# Patient Record
Sex: Female | Born: 1945 | Race: Black or African American | Hispanic: No | Smoking: Former smoker
Health system: Southern US, Community
[De-identification: ages and names within clinical notes are randomized; demographics above are authoritative.]

## PROBLEM LIST (undated history)

## (undated) DIAGNOSIS — E785 Hyperlipidemia, unspecified: Secondary | ICD-10-CM

## (undated) DIAGNOSIS — R011 Cardiac murmur, unspecified: Secondary | ICD-10-CM

## (undated) DIAGNOSIS — F419 Anxiety disorder, unspecified: Secondary | ICD-10-CM

## (undated) DIAGNOSIS — N201 Calculus of ureter: Secondary | ICD-10-CM

## (undated) DIAGNOSIS — Z973 Presence of spectacles and contact lenses: Secondary | ICD-10-CM

## (undated) DIAGNOSIS — I10 Essential (primary) hypertension: Secondary | ICD-10-CM

## (undated) DIAGNOSIS — Z87442 Personal history of urinary calculi: Secondary | ICD-10-CM

## (undated) DIAGNOSIS — M1990 Unspecified osteoarthritis, unspecified site: Secondary | ICD-10-CM

## (undated) DIAGNOSIS — Z972 Presence of dental prosthetic device (complete) (partial): Secondary | ICD-10-CM

## (undated) DIAGNOSIS — I350 Nonrheumatic aortic (valve) stenosis: Secondary | ICD-10-CM

## (undated) HISTORY — PX: TOTAL KNEE ARTHROPLASTY: SHX125

## (undated) HISTORY — PX: TRANSTHORACIC ECHOCARDIOGRAM: SHX275

## (undated) HISTORY — DX: Nonrheumatic aortic (valve) stenosis: I35.0

## (undated) HISTORY — PX: REVISION TOTAL KNEE ARTHROPLASTY: SUR1280

## (undated) HISTORY — PX: ABDOMINAL HYSTERECTOMY: SHX81

---

## 1998-03-30 ENCOUNTER — Emergency Department (HOSPITAL_COMMUNITY): Admission: EM | Admit: 1998-03-30 | Discharge: 1998-03-30 | Payer: Self-pay | Admitting: Emergency Medicine

## 1998-04-06 ENCOUNTER — Encounter: Admission: RE | Admit: 1998-04-06 | Discharge: 1998-04-06 | Payer: Self-pay | Admitting: *Deleted

## 2000-04-17 ENCOUNTER — Other Ambulatory Visit: Admission: RE | Admit: 2000-04-17 | Discharge: 2000-04-17 | Payer: Self-pay | Admitting: Ophthalmology

## 2000-04-17 ENCOUNTER — Encounter (INDEPENDENT_AMBULATORY_CARE_PROVIDER_SITE_OTHER): Payer: Self-pay | Admitting: Specialist

## 2000-06-17 HISTORY — PX: CORNEAL TRANSPLANT: SHX108

## 2000-12-11 ENCOUNTER — Encounter: Admission: RE | Admit: 2000-12-11 | Discharge: 2000-12-11 | Payer: Self-pay | Admitting: Orthopedic Surgery

## 2000-12-11 ENCOUNTER — Encounter: Payer: Self-pay | Admitting: Orthopedic Surgery

## 2001-02-24 ENCOUNTER — Other Ambulatory Visit: Admission: RE | Admit: 2001-02-24 | Discharge: 2001-02-24 | Payer: Self-pay | Admitting: *Deleted

## 2001-11-26 ENCOUNTER — Emergency Department (HOSPITAL_COMMUNITY): Admission: EM | Admit: 2001-11-26 | Discharge: 2001-11-26 | Payer: Self-pay | Admitting: Emergency Medicine

## 2001-11-30 ENCOUNTER — Encounter: Admission: RE | Admit: 2001-11-30 | Discharge: 2001-11-30 | Payer: Self-pay | Admitting: Occupational Medicine

## 2001-11-30 ENCOUNTER — Encounter: Payer: Self-pay | Admitting: Occupational Medicine

## 2003-03-28 ENCOUNTER — Encounter: Payer: Self-pay | Admitting: Orthopedic Surgery

## 2003-03-30 ENCOUNTER — Inpatient Hospital Stay (HOSPITAL_COMMUNITY): Admission: RE | Admit: 2003-03-30 | Discharge: 2003-04-02 | Payer: Self-pay | Admitting: Orthopedic Surgery

## 2003-03-30 ENCOUNTER — Encounter: Payer: Self-pay | Admitting: Orthopedic Surgery

## 2003-04-19 ENCOUNTER — Ambulatory Visit (HOSPITAL_BASED_OUTPATIENT_CLINIC_OR_DEPARTMENT_OTHER): Admission: RE | Admit: 2003-04-19 | Discharge: 2003-04-19 | Payer: Self-pay | Admitting: Orthopedic Surgery

## 2003-04-20 ENCOUNTER — Encounter (INDEPENDENT_AMBULATORY_CARE_PROVIDER_SITE_OTHER): Payer: Self-pay | Admitting: *Deleted

## 2003-04-20 ENCOUNTER — Ambulatory Visit (HOSPITAL_COMMUNITY): Admission: RE | Admit: 2003-04-20 | Discharge: 2003-04-20 | Payer: Self-pay | Admitting: Orthopedic Surgery

## 2003-04-20 ENCOUNTER — Ambulatory Visit (HOSPITAL_BASED_OUTPATIENT_CLINIC_OR_DEPARTMENT_OTHER): Admission: RE | Admit: 2003-04-20 | Discharge: 2003-04-20 | Payer: Self-pay | Admitting: Orthopedic Surgery

## 2003-07-19 ENCOUNTER — Encounter: Admission: RE | Admit: 2003-07-19 | Discharge: 2003-07-19 | Payer: Self-pay | Admitting: Orthopedic Surgery

## 2003-08-01 ENCOUNTER — Inpatient Hospital Stay (HOSPITAL_COMMUNITY): Admission: RE | Admit: 2003-08-01 | Discharge: 2003-08-04 | Payer: Self-pay | Admitting: Orthopedic Surgery

## 2003-08-01 ENCOUNTER — Encounter (INDEPENDENT_AMBULATORY_CARE_PROVIDER_SITE_OTHER): Payer: Self-pay | Admitting: Specialist

## 2003-09-21 ENCOUNTER — Encounter (INDEPENDENT_AMBULATORY_CARE_PROVIDER_SITE_OTHER): Payer: Self-pay | Admitting: Specialist

## 2003-09-21 ENCOUNTER — Ambulatory Visit (HOSPITAL_COMMUNITY): Admission: RE | Admit: 2003-09-21 | Discharge: 2003-09-21 | Payer: Self-pay | Admitting: *Deleted

## 2003-09-21 HISTORY — PX: COLONOSCOPY WITH ESOPHAGOGASTRODUODENOSCOPY (EGD): SHX5779

## 2004-04-26 ENCOUNTER — Encounter: Admission: RE | Admit: 2004-04-26 | Discharge: 2004-04-26 | Payer: Self-pay | Admitting: Orthopedic Surgery

## 2004-06-19 ENCOUNTER — Ambulatory Visit (HOSPITAL_COMMUNITY): Admission: RE | Admit: 2004-06-19 | Discharge: 2004-06-19 | Payer: Self-pay | Admitting: Orthopedic Surgery

## 2004-08-06 ENCOUNTER — Inpatient Hospital Stay (HOSPITAL_COMMUNITY): Admission: RE | Admit: 2004-08-06 | Discharge: 2004-08-09 | Payer: Self-pay | Admitting: Orthopedic Surgery

## 2004-08-06 ENCOUNTER — Ambulatory Visit: Payer: Self-pay | Admitting: Physical Medicine & Rehabilitation

## 2004-08-06 ENCOUNTER — Encounter (INDEPENDENT_AMBULATORY_CARE_PROVIDER_SITE_OTHER): Payer: Self-pay | Admitting: *Deleted

## 2004-11-05 ENCOUNTER — Inpatient Hospital Stay (HOSPITAL_COMMUNITY): Admission: RE | Admit: 2004-11-05 | Discharge: 2004-11-08 | Payer: Self-pay | Admitting: Orthopedic Surgery

## 2007-02-16 ENCOUNTER — Emergency Department (HOSPITAL_COMMUNITY): Admission: EM | Admit: 2007-02-16 | Discharge: 2007-02-17 | Payer: Self-pay | Admitting: *Deleted

## 2007-09-01 ENCOUNTER — Encounter: Admission: RE | Admit: 2007-09-01 | Discharge: 2007-09-01 | Payer: Self-pay | Admitting: Internal Medicine

## 2008-12-12 ENCOUNTER — Encounter: Admission: RE | Admit: 2008-12-12 | Discharge: 2008-12-12 | Payer: Self-pay | Admitting: Internal Medicine

## 2009-04-10 ENCOUNTER — Ambulatory Visit: Payer: Self-pay | Admitting: Internal Medicine

## 2009-05-09 ENCOUNTER — Ambulatory Visit: Payer: Self-pay | Admitting: Internal Medicine

## 2009-05-16 ENCOUNTER — Encounter: Payer: Self-pay | Admitting: Internal Medicine

## 2009-07-20 ENCOUNTER — Observation Stay (HOSPITAL_COMMUNITY): Admission: EM | Admit: 2009-07-20 | Discharge: 2009-07-22 | Payer: Self-pay | Admitting: Emergency Medicine

## 2009-07-20 ENCOUNTER — Ambulatory Visit: Payer: Self-pay | Admitting: Cardiovascular Disease

## 2009-07-21 ENCOUNTER — Encounter: Payer: Self-pay | Admitting: Internal Medicine

## 2009-07-22 ENCOUNTER — Encounter: Payer: Self-pay | Admitting: Internal Medicine

## 2009-10-13 ENCOUNTER — Ambulatory Visit: Payer: Self-pay | Admitting: Internal Medicine

## 2009-10-13 DIAGNOSIS — I10 Essential (primary) hypertension: Secondary | ICD-10-CM | POA: Insufficient documentation

## 2009-10-13 DIAGNOSIS — R000 Tachycardia, unspecified: Secondary | ICD-10-CM

## 2009-10-13 DIAGNOSIS — E785 Hyperlipidemia, unspecified: Secondary | ICD-10-CM | POA: Insufficient documentation

## 2009-10-13 DIAGNOSIS — R0602 Shortness of breath: Secondary | ICD-10-CM | POA: Insufficient documentation

## 2009-11-15 ENCOUNTER — Encounter: Admission: RE | Admit: 2009-11-15 | Discharge: 2009-11-15 | Payer: Self-pay | Admitting: Internal Medicine

## 2009-11-29 ENCOUNTER — Encounter: Admission: RE | Admit: 2009-11-29 | Discharge: 2009-11-29 | Payer: Self-pay | Admitting: Internal Medicine

## 2010-03-09 ENCOUNTER — Ambulatory Visit: Payer: Self-pay | Admitting: Internal Medicine

## 2010-03-09 DIAGNOSIS — R079 Chest pain, unspecified: Secondary | ICD-10-CM | POA: Insufficient documentation

## 2010-05-29 ENCOUNTER — Ambulatory Visit (HOSPITAL_COMMUNITY)
Admission: RE | Admit: 2010-05-29 | Discharge: 2010-05-29 | Payer: Self-pay | Source: Home / Self Care | Attending: Orthopedic Surgery | Admitting: Orthopedic Surgery

## 2010-07-08 ENCOUNTER — Encounter: Payer: Self-pay | Admitting: Internal Medicine

## 2010-07-19 NOTE — Assessment & Plan Note (Signed)
Summary: EPH   Primary Provider:  Dr. Ricki Miller  CC:  sob and pt mother just passed so pt has been under alot of stress.  History of Present Illness: Patient is a 65 year old with a history of chest pain.  SHe was admitted to [facility] in February with chest pains.  Myoview was done which showed normal perfusion.  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  (peak grad 34 mm).  Plan was for continued medical rx with f/u Since seen, she had had some SOB.  Notes some dizziness but no presyncope/syncope.     Current Medications (verified): 1)  Azor 10-40 Mg Tabs (Amlodipine-Olmesartan) .Marland Kitchen.. 1 Tab By Mouth Once Daily 2)  Prednisolone Sodium Phosphate 1 % Soln (Prednisolone Sodium Phosphate) .... As Directed 3)  Lipitor 40 Mg Tabs (Atorvastatin Calcium) .... Take One Tablet By Mouth Daily. 4)  Vitamin D (Ergocalciferol) 50000 Unit Caps (Ergocalciferol) .... 2 Times Weekly 5)  Metoprolol Succinate 50 Mg Xr24h-Tab (Metoprolol Succinate) .... Take One Tablet By Mouth Daily 6)  Actonel 35 Mg Tabs (Risedronate Sodium) .Marland Kitchen.. 1 Tab By Mouth Once Daily 7)  Aspirin 81 Mg Tbec (Aspirin) .... Take One Tablet By Mouth Daily  Allergies: No Known Drug Allergies  Past History:  Past medical, surgical, family and social histories (including risk factors) reviewed, and no changes noted (except as noted below).  Past Medical History: Reviewed history from 10/11/2009 and no changes required.  Obesity.    Hypertension.    Hyperlipidemia.   Degenerative joint disease.   Past Surgical History: Reviewed history from 10/11/2009 and no changes required.   Status post bilateral knee replacements    Hysterectomy    Corneal transplant      Family History: Reviewed history from 10/11/2009 and no changes required. Father:  died of a   pulmonary embolus per report at age 5. Siblings: brother who is alive   with hypertension.    Social History: Reviewed history from 10/11/2009 and no changes required. patient lives in [place] Tobacco Use - Yes. 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   Vital Signs:  Patient profile:   65 year old female Height:      65 inches Weight:      233 pounds BMI:     38.91 Pulse rate:   81 / minute Resp:     14 per minute BP sitting:   122 / 90  (left arm)  Vitals Entered By: Kem Parkinson (October 13, 2009 2:24 PM)  Physical Exam  Additional Exam:  Patient is in NAD. HEENT:  Normocephalic, atraumatic. EOMI, PERRLA.  Neck: JVP is normal. No thyromegaly. No bruits.  Lungs: clear to auscultation. No rales no wheezes.  Heart: Regular rate and rhythm. Normal S1, S2. No S3.  Gr. III/VI systolic murmur at base.   PMI not displaced.  Abdomen:  Supple, nontender. Normal bowel sounds. No masses. No hepatomegaly.  Extremities:   Good distal pulses throughout. No lower extremity edema.  Musculoskeletal :moving all extremities.  Neuro:   alert and oriented x3.    EKG  Procedure date:  10/13/2009  Findings:      NSR.  81 bpm.  Impression & Recommendations:  Problem # 1:  DYSPNEA (ICD-786.05) Patient's echo with dynamic LVOT obstruction.  I am not convinced this represents a  hypertrophic CM though it will have to be followed.  AV on echo is mildly thickened but does not appear to be stenotic. On review of the medical regimen,  I have recommended that she stop Azor. Instead, I would recommend that she begin Cardiazem CD 240 for it's negative chronotrope/inotrope properties.  In additon, I would add Lisinopril.4o mg.  She  She is due to be seen by Dr. Ricki Miller in about 4 wks.  I will f/u after.  She was told to call if she develops dizziness.  Problem # 2:  HYPERTENSION, BENIGN (ICD-401.1) see above.  Problem # 3:  HYPERLIPIDEMIA-MIXED (ICD-272.4) COntinue statin. Her updated medication list for this  problem includes:    Lipitor 40 Mg Tabs (Atorvastatin calcium) .Marland Kitchen... Take one tablet by mouth daily.  Other Orders: EKG w/ Interpretation (93000)  Patient Instructions: 1)  Your physician recommends that you schedule a follow-up appointment in: August 2011 Prescriptions: DILTIAZEM HCL ER BEADS 240 MG XR24H-CAP (DILTIAZEM HCL ER BEADS) 1 tab every day  #30 x 6   Entered by:   Layne Benton, RN, BSN   Authorized by:   Sherrill Raring, MD, [facility]   Signed by:   Layne Benton, RN, BSN on 10/13/2009   Method used:   Electronically to        [facility] [phone]* (retail)       [address]       [place], [place]  [address]       Ph: [phone] or [phone]       Fax: [phone]   RxID:   [id number] LISINOPRIL 40 MG TABS (LISINOPRIL) 1 tab every day  #30 x 6   Entered by:   Layne Benton, RN, BSN   Authorized by:   Sherrill Raring, MD, [facility]   Signed by:   Layne Benton, RN, BSN on 10/13/2009   Method used:   [facility]        [facility] [phone][facility] (retail)       [address]       [place], [place]  [address]       Ph: [phone] or [phone]       Fax: [phone]   RxID:   [id number]

## 2010-07-19 NOTE — Assessment & Plan Note (Signed)
Summary: PER CHECK OUT/SF   Primary Kelsey [name]:  Dr. Ricki Miller   History of Present Illness: Patient is a 65 year old with a history of chest pain. Negative myoview. 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  (peak grad 34 mm).  She also has a history of hypertension.  I saw her in clinic in May  At that time I took her off Azor and placed her on Benicar and diltiazem.  She did not start the diltiazem. She says overall she has been doing ok.  No chest pain.  No signif SOB.  Seen by Dr. Ricki Miller 2 wks ago.  BP was OK.  Current Medications (verified): 1)  Prednisolone Sodium Phosphate 1 % Soln (Prednisolone Sodium Phosphate) .... As Directed 2)  Lipitor 40 Mg Tabs (Atorvastatin Calcium) .... Take One Tablet By Mouth Daily. 3)  Vitamin D (Ergocalciferol) 50000 Unit Caps (Ergocalciferol) .... 2 Times Weekly 4)  Metoprolol Succinate 50 Mg Xr24h-Tab (Metoprolol Succinate) .... Take One Tablet By Mouth Daily 5)  Actonel 35 Mg Tabs (Risedronate Sodium) .Marland Kitchen.. 1 Tab By Mouth Weekly 6)  Aspirin 81 Mg Tbec (Aspirin) .... Take One Tablet By Mouth Daily 7)  Benicar 40 Mg Tabs (Olmesartan Medoxomil) .Marland Kitchen.. 1 By Mouth Daily  Allergies (verified): No Known Drug Allergies  Past History:  Past medical, surgical, family and social histories (including risk factors) reviewed, and no changes noted (except as noted below).  Past Medical History: Reviewed history from 10/11/2009 and no changes required.  Obesity.    Hypertension.    Hyperlipidemia.   Degenerative joint disease.   Past Surgical History: Reviewed history from 10/11/2009 and no changes required.   Status post bilateral knee replacements    Hysterectomy    Corneal transplant      Family History: Reviewed history from 10/11/2009 and no changes required. Father:  died of a   pulmonary embolus per report at age 107. Siblings: brother who is alive   with  hypertension.   Social History: Reviewed history from 10/11/2009 and no changes required. patient lives in [place] Tobacco Use - Yes. 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   Vital Signs:  Patient profile:   65 year old female Height:      65 inches Weight:      223 pounds BMI:     37.24 Pulse rate:   76 / minute Resp:     18 per minute BP sitting:   151 / 95  (left arm)  Vitals Entered By: Marrion Coy, CNA (March 09, 2010 3:51 PM)  Physical Exam  Additional Exam:  pateint is in NAD HEENT:  Normocephalic, atraumatic. EOMI, PERRLA.  Neck: JVP is normal. No thyromegaly. No bruits.  Lungs: clear to auscultation. No rales no wheezes.  Heart: Regular rate and rhythm. Normal S1, S2. No S3.   No significant murmurs. PMI not displaced.  Abdomen:  Supple, nontender. Normal bowel sounds. No masses. No hepatomegaly.  Extremities:   Good distal pulses throughout. No lower extremity edema.  Musculoskeletal :moving all extremities.  Neuro:   alert and oriented x3.    EKG  Procedure date:  03/09/2010  Findings:      NSR.  66 bpm.  LVH  Impression & Recommendations:  Problem # 1:  HYPERTENSION, BENIGN (ICD-401.1) I have gone up on her Metoprolol.  she will continue to get her BP checked.  She is planning on getting a BP cuff.  She will f/u with Dr. Ricki Miller.  Problem # 2:  HYPERLIPIDEMIA-MIXED (ICD-272.4) COntinue.  Will need to follow. Her updated medication list for this problem includes:    Lipitor 40 Mg Tabs (Atorvastatin calcium) .Marland Kitchen... Take one tablet by mouth daily.  Problem # 3:  CHEST PAIN-UNSPECIFIED (ICD-786.50) On complaints.  Other Orders: EKG w/ Interpretation (93000)  Patient Instructions: 1)  Your physician wants you to follow-up in:6 months   You will receive a reminder letter in the mail two months in advance. If you don't receive a letter, please call our office to schedule  the follow-up appointment. Prescriptions: METOPROLOL SUCCINATE 50 MG XR24H-TAB (METOPROLOL SUCCINATE) Take one and one half  tablets by mouth daily  #45 x 6   Entered by:   Layne Benton, RN, BSN   Authorized by:   Sherrill Raring, MD, [facility]   Signed by:   Layne Benton, RN, BSN on 03/09/2010   Method used:   [facility] to        [facility] [phone]* (retail)       [address]       [place], [place]  [address]       Ph: [phone] or [phone]       Fax: [phone]   RxID:   [id number]

## 2010-09-05 LAB — LIPID PANEL
Cholesterol: 167 mg/dL (ref 0–200)
LDL Cholesterol: 77 mg/dL (ref 0–99)
Triglycerides: 68 mg/dL (ref <150)
VLDL: 14 mg/dL (ref 0–40)

## 2010-09-05 LAB — COMPREHENSIVE METABOLIC PANEL
AST: 27 U/L (ref 0–37)
BUN: 16 mg/dL (ref 6–23)
CO2: 25 mEq/L (ref 19–32)
Calcium: 9.3 mg/dL (ref 8.4–10.5)
Chloride: 108 mEq/L (ref 96–112)
Creatinine, Ser: 0.73 mg/dL (ref 0.4–1.2)
GFR calc Af Amer: >60 mL/min (ref >60)
GFR calc non Af Amer: >60 mL/min (ref >60)
Total Bilirubin: 1.2 mg/dL (ref 0.3–1.2)

## 2010-09-05 LAB — CBC
HCT: 40.8 % (ref 36.0–46.0)
MCHC: 33.7 g/dL (ref 30.0–36.0)
MCV: 90.8 fL (ref 78.0–100.0)
Platelets: 266 10*3/uL (ref 150–400)
RBC: 4.49 MIL/uL (ref 3.87–5.11)
WBC: 8 10*3/uL (ref 4.0–10.5)

## 2010-09-05 LAB — DIFFERENTIAL
Basophils Absolute: 0 10*3/uL (ref 0.0–0.1)
Lymphocytes Relative: 37 % (ref 12–46)
Lymphs Abs: 3 10*3/uL (ref 0.7–4.0)
Neutro Abs: 4.3 10*3/uL (ref 1.7–7.7)

## 2010-09-05 LAB — CARDIAC PANEL(CRET KIN+CKTOT+MB+TROPI)
CK, MB: 1 ng/mL (ref 0.3–4.0)
CK, MB: 1.5 ng/mL (ref 0.3–4.0)
Relative Index: 0.8 (ref 0.0–2.5)
Relative Index: 0.9 (ref 0.0–2.5)
Total CK: 120 U/L (ref 7–177)

## 2010-09-05 LAB — POCT CARDIAC MARKERS
CKMB, poc: 1.5 ng/mL (ref 1.0–8.0)
Myoglobin, poc: 53.3 ng/mL (ref 12–200)
Troponin i, poc: <0.05 ng/mL (ref 0.00–0.09)

## 2010-09-05 LAB — LIPASE, BLOOD: Lipase: 22 U/L (ref 11–59)

## 2010-11-02 NOTE — Op Note (Signed)
NAME:  Kelsey [name], Kelsey [name]                       ACCOUNT NO.:  [id number]   MEDICAL RECORD NO.:  [id number]                   PATIENT TYPE:  AMB   LOCATION:  ENDO                                 FACILITY[facility]  [facility]   PHYSICIAN:  Georgiana Spinner, M.D.                 DATE OF BIRTH:  Jun 13, 1946   DATE OF PROCEDURE:  DATE OF DISCHARGE:                                 OPERATIVE REPORT   PROCEDURE:  Upper endoscopy.   INDICATIONS FOR PROCEDURE:  Gastroesophageal reflux disease.   ANESTHESIA:  Demerol 60, Versed 8 mg.   DESCRIPTION OF PROCEDURE:  With the patient mildly sedated in the left  lateral decubitus position, the Olympus videoscopic endoscope was inserted  in the mouth and passed under direct vision through the esophagus which  appeared normal into the stomach. The fundus and body appeared normal.  The  antrum showed erythematous changes as did the duodenal bulb both of which  were photographed, the former was biopsied. The second portion of the  duodenum appeared normal.  From this point, the endoscope was slowly  withdrawn taking circumferential views of the duodenal mucosa until the  endoscope was then pulled back in the stomach, placed in retroflexion to  view the stomach from below. The endoscope was then straightened and  withdrawn taking circumferential views of the remaining gastric and  esophageal mucosa. The patient's vital signs and pulse oximeter remained  stable. The patient tolerated the procedure well without apparent  complications.   FINDINGS:  Erythematous changes of the duodenal bulb and antrum fairly mild.  The antrum was biopsied. Will await biopsy report. The patient will call me  for results and followup with me as an outpatient.  Proceed to colonoscopy  as planned.                                               Georgiana Spinner, M.D.    GMO/MEDQ  D:  09/21/2003  T:  09/21/2003  Job:  [id number]

## 2010-11-02 NOTE — Op Note (Signed)
NAME:  Kelsey [name], DEXHEIMER                       ACCOUNT NO.:  [id number]   MEDICAL RECORD NO.:  [id number]                   PATIENT TYPE:  INP   LOCATION:  2550                                 FACILITY:  [facility]   PHYSICIAN:  Mila Homer. Sherlean Foot, M.D.              DATE OF BIRTH:  1945-11-04   DATE OF PROCEDURE:  08/01/2003  DATE OF DISCHARGE:                                 OPERATIVE REPORT   SURGEON:  Mila Homer. Sherlean Foot, M.D.   Threasa HeadsDyke Brackett, M.D. and Richardean Canal, P.A.   ANESTHESIA:  General.   PREOPERATIVE DIAGNOSIS:  Failed left total knee replacement.   POSTOPERATIVE DIAGNOSIS:  Failed left total knee replacement.   PROCEDURE:  Left knee revision total knee replacement (tibial component  only).   INDICATIONS FOR PROCEDURE:  The patient is a 65 year old black female, now  five months status post left total knee replacement.  She has had  radiographic evidence of loosening and pain.  An informed consent was  obtained.   DESCRIPTION OF PROCEDURE:  The patient was laid supine and administered a  general anesthesia.  The left lower extremity was prepped and draped in a  sterile fashion.  A #10 blade was used to make an incision along the  previous incision, and the keloid scar was excised.  A fresh blade was used  to make a median parapatellar arthrotomy, and the old Ethibond sutures were  removed.  The fluid was sent off for culture, cell count and differential.  All came back negative.  A synovectomy was performed with a fresh #10 blade.  A subperiosteal dissection was performed, elevating the deep MCL off of the  medial crest of the tibia, and the knee cap was subluxed laterally, and the  knee was brought into 90 degrees of flexion.  I then cut the tibial posts  with an osteotome and removed the polyethylene.  I then used a small  sagittal saw to create an implant cement interface, and removed the tibia  with ease.  I then removed what remaining cement there was left  on the  tibia.  I then reamed the tibia up to a size #18 stem, and used the cutting  block to make a perpendicular cut on the tibia.  I removed basically 2.0 mm  of anterior bone, and no posterior bone.  I then drilled and keeled to  accept a size 2.5 tibial tray with a #18 stem and trialed with a 20.0 mm  polyethylene.  This offered excellent flexion and extension balance.  I then  copiously irrigated, and then cemented in with Palacos and Tobramycin mixed  in.  A size 2.5 tibial implant with a size #18 stem with the stem Press-Fit.  I then put the new polyethylene in place, and again good flexion and  extension gap and balance.  I let the tourniquet down, and obtained  hemostasis.  I then irrigated again.  I then left a Hemovac deep to the  arthrotomy, and I closed the arthrotomy with interrupted figure-of-eight #1  Vicryl sutures.  I closed the deep soft tissues with interrupted #0 Vicryl,  the subcuticular with #2-0 Vicryl, and skin staples.  I dressed with  Adaptic, 4 x 4s, sterile Webril and a TED stocking.  The estimated blood  loss was 300 mL.  Complications:  None.  Tourniquet time:  One hour and 25  minutes.                                               Mila Homer. Sherlean Foot, M.D.   SDL/MEDQ  D:  08/01/2003  T:  08/01/2003  Job:  [id number]

## 2010-11-02 NOTE — Discharge Summary (Signed)
NAME:  Kelsey [name], Kelsey [name]                       ACCOUNT NO.:  [id number]   MEDICAL RECORD NO.:  [id number]                   PATIENT TYPE:  INP   LOCATION:  5019                                 FACILITY:  [facility]   PHYSICIAN:  Mila Homer. Sherlean Foot, M.D.              DATE OF BIRTH:  1946/04/03   DATE OF ADMISSION:  08/01/2003  DATE OF DISCHARGE:  08/04/2003                                 DISCHARGE SUMMARY   ADMITTING DIAGNOSES:  1. Status post left total knee now with possible infected knee versus     loosening.  2. Hypertension.  3. Hypercholesterolemia.  4. Cardiac murmur 3/6 systolic murmur heard throughout the entire left     pericardium.   DISCHARGE DIAGNOSES:  1. Status post revision left total knee arthroplasty.  2. Acute blood loss anemia secondary to surgery.  3. Hypertension.  4. Hypercholesterolemia.  5. Cardiac murmur 3/6 systolic murmur.   HISTORY OF PRESENT ILLNESS:  Kelsey [name] is a 65 year old African-American  female who underwent left total knee arthroplasty on March 30, 2003.  The  patient developed superficial skin infection early November 2004.  The  patient underwent I&D and aspiration of the knee as an outpatient under  general anesthesia.  Cultures from the knee grew out nothing.  Superficial  cultures grew out __________ staph.  The patient was placed on Keflex  antibiotics.  The patient continued to have knee pain with limited range of  motion.  She described the knee pain diffuse pain throughout the entire  knee.  She was unable to sit for prolonged periods of time due to knee pain.  She notes an overall weakness in the knee.  The pain does keep her awake at  night.  She requires 2 to 3 Vicodin per day to manage her pain.  The patient  underwent a CAT scan of the left knee which showed possible loosening of the  tibial tray.  Bone scan of the left knee showed increased soft tissue uptake  as well as increased delayed activity over the entire left knee  prosthesis.  The patient, therefore, was admit to [facility] on August 01, 2003, and underwent a left knee exploration with revision of the left total  knee.   ALLERGIES:  NO KNOWN DRUG ALLERGIES, however, she does state that she is  sensitive to CEPHALOSPORIN.   MEDICATIONS:  1. Celebrex 300 mg p.o. q.d.  2. Prednisolone AC 1%, 1 drop to the left eye t.i.d. to q.i.d.  3. Lipitor 10 mg p.o. q.d.  4. Toprol XL 100 mg q.d.  5. Centrum multivitamins q.d.  6. Aspirin 81 mg.   SURGICAL PROCEDURE:  The patient was taken to the operating room on August 01, 2003, by Dr. Georgena Spurling assisted by Dyke Brackett, M.D., and Richardean Canal, P.A.  The patient was placed under general anesthesia and the left  knee revision of the total knee  involving the tibial component only was  performed.  The patient tolerated the procedure well and returned to  recovery in good condition.   CONSULTS:  The following consults were obtained while the patient was  hospitalized:  1. PT  2. Case management.   HOSPITAL COURSE:  The patient's vital signs remained stable throughout  hospital course.  The patient remained afebrile.  The patient did develop  acute blood loss anemia secondary to surgery, however, required no blood  products and at the time of discharge H&H was 8.7/25.9.   LABS:  Routine labs on admission:  1. CBC, white blood count 7.8, hemoglobin 12.9, hematocrit 39.0, platelets     283.  2. ESR done on July 29, 2003, was 21.  3. Coags on admission, PT 12.7, INR 0.9, PTT 31.  4. Routine chemistries, sodium 142, potassium 3.4, chloride 107, bicarb 29,     glucose 98, BUN 15, creatinine 0.8.  5. Hepatic enzymes on admission, AST 23, ALT 30, ALP 173, total bilirubin     0.5.  6. C-reactive protein 0.3.  7. Urinalysis on admission was negative.  8. Synovial fluid left knee, no growth after 3 days.  9. Anaerobic cultures, synovial fluid left knee showed no anaerobes.  10.       Gram stain synovial fluid left knee, no organisms, no anaerobes     present.   DISCHARGE INSTRUCTIONS:  Meds, resume home meds except for aspirin.  The  patient is to hold aspirin until finished with Lovenox therapy.  The patient  is to have the following meds:  1. Lovenox 40 mg, 1 injection q.d. x 11 days.  2. OxyContin 10 mg SR, 1 tablet q.12h.  3. Percocet 5 mg, 1 to 2 tablets q.4-6h. p.r.n. pain.  4. Iron 325 mg, 1 tablet q.d. x 30 days.   ACTIVITY:  The patient is weightbearing as tolerated with walker.  Home  health PT with [facility].   DIET:  No restrictions.   FOLLOWUP:  The patient needs to follow up Dr. Sherlean Foot in approximately 10 days  from date of discharge.  The patient is to call [phone] to make an  appointment.   SPECIAL INSTRUCTIONS:  CPM 0 to 70 degrees 6 to 8 hours a day, increase by  10 degrees daily.   WOUND CARE:  Daily dressing changes.  May shower after 2 days if no drainage  from the wound.  The patient is to call the office at [phone] if she  develops any signs of infection.   CONDITION ON DISCHARGE:  The patient will be discharged home in good and  stable condition.      Richardean Canal, P.A.                       Mila Homer. Sherlean Foot, M.D.    GC/MEDQ  D:  08/30/2003  T:  09/02/2003  Job:  [id number]

## 2010-11-02 NOTE — Op Note (Signed)
NAMEMOMO, BRAUN             ACCOUNT NO.:  [id number]   MEDICAL RECORD NO.:  [id number]          PATIENT TYPE:  INP   LOCATION:  2550                         FACILITY:  [facility]   PHYSICIAN:  Mila Homer. Sherlean Foot, M.D. DATE OF BIRTH:  September 15, 1945   DATE OF PROCEDURE:  11/05/2004  DATE OF DISCHARGE:                                 OPERATIVE REPORT   PREOPERATIVE DIAGNOSIS:  Right knee osteoarthritis.   POSTOPERATIVE DIAGNOSIS:  Right knee osteoarthritis.   OPERATION PERFORMED:  Right knee total knee arthroplasty.   SURGEON:  Mila Homer. Sherlean Foot, M.D.   ASSISTANT:  Richardean Canal, P.A.   ANESTHESIA:  General.   INDICATIONS FOR PROCEDURE:  The patient is a 65 year old black female with  failure of conservative measures for osteoarthritis of the right knee.  She  has had prior primary intervention arthroplasties on the opposite knee.  Informed consent was obtained.   DESCRIPTION OF PROCEDURE:  The patient was laid supine and administered  general anesthesia.  The right lower extremity was prepped and draped in the  usual sterile fashion.  A #10 blade was used to make a midline incision.  A  fresh blade was used to make a median parapatellar arthrotomy and elevate  the deep MCL off the medial crest of the tibia.  Also used a 10 blade to  perform a synovectomy.  Then went into flexion.  I used an extramedullary  alignment system to remove a perpendicular cut of bone off the tibial  articular surface removing two mm of bone off the medial side which was the  low side with this varus knee.  I removed the cut surface of the tibia as  well as the extramedullary guide.  I then placed the intramedullary drill  into the femur, placed the intramedullary guide set on 6 degree valgus cut.  I pinned the distal femoral cutting block into place. I removed the  intramedullary guide and made the distal femoral cut with a sagittal saw.  I  then drew out the epicondylar axis.  I drew Whiteside's line as  well.  At  this point, I placed the sizing guide in place which measured a size D and  pinned it to a 3 degree external rotation holes since the posterior condylar  angle was 3 degrees.  At this point I used a 4 in 1 cutter and made the  anterior and posterior chamfer cuts.  I removed the excess pieces of bone as  well as the cutting block.  I then placed a laminar spreader in the knee and  removed the medial and lateral menisci, the ACL and PCL and the posterior  condylar osteophytes.  I then put a 10 mm spacer block in the knee and the  alignment rod showed that it was an excellent cut  in neutral and parallel  to the anatomic axis of the tibia.  At this point I put the scoop retractor  behind the femur and finished the femur with the size D finishing block,  drill, keel and saw.  I then finished the tibia with a size 4 tibial tray,  drill and keel.  I trialed with a size 4 tibia, size D femur, size 10 insert  __________ 35 mm patella.  Flexion extension gaps were excellent.  The  patella tracked beautifully.  Drop and dangle was to about 125 degrees.  Preoperative range of motion was 0 to 95.  I was very pleased with this.  I  then removed trial components, copiously irrigated, I then cemented in a  size 4 tibia first, removed excess cement and then cemented in a size D  femur second.  Removed excess cement, snapped in real polyethylene, located  the knee in extension and cemented in a 35 mm patella.  After removing  excess cement, I then placed a Hemovac going out superolaterally and deep to  the arthrotomy.  I then let the tourniquet down and cauterized bleeding  vessels.  I closed the arthrotomy with interrupted figure-of-eight #1 Vicryl  sutures, deep soft tissue with interrupted 0 Vicryl sutures and then  subcuticular 2-0 Vicryl running stitch and skin staples.  Dressed with  Adaptic, 4 x 4s, sterile Webril and TED stocking.   COMPLICATIONS:  None.   DRAINS:  1 Hemovac.    ESTIMATED BLOOD LOSS:  300 cc.   TOURNIQUET TIME:  53 minutes.      SDL/MEDQ  D:  11/05/2004  T:  11/05/2004  Job:  [id number]

## 2010-11-02 NOTE — H&P (Signed)
Kelsey [name], [name] NO.:  [id number]   MEDICAL RECORD NO.:  [id number]                   PATIENT TYPE:  INP   LOCATION:                                       FACILITY:  [facility]   PHYSICIAN:  Kelsey [name], M.D.                 DATE OF BIRTH:  1945/07/15   DATE OF ADMISSION:  03/30/2003  DATE OF DISCHARGE:                                HISTORY & PHYSICAL   CHIEF COMPLAINT:  Left knee pain for four years.   HISTORY OF PRESENT ILLNESS:  The patient is a 65 year old black female with  approximately four years history of progressively worsening left knee pain  and difficulties with range of motion.  The patient states that she has  significant increase of pain in her left knee with weightbearing activities,  transitioning from sitting-to-standing and movements in bed at night.  She  describes the pain as a constant aching pain with sharp shooting pains in  the knee with awkward movements in weightbearing.  She does note swelling.  She does have popping and clicking in the knee.  She does note that the knee  does occasionally give out.  The pain does not radiate from within the knee  and  X-rays diagnose severe osteoarthritis of the knee.   DRUG ALLERGIES:  No known drug allergies.   CURRENT MEDICATIONS:  1. Celebrex 200 mg p.o. daily.  2. Prednisolone AC 1% one drop left eye three to four times  a day.  3. Lipitor 10 mg p.o. daily.  4. Toprol XL 100 mg p.o. daily.  5. Centrum multivitamins.  6. Aspirin 81 mg p.o. daily.   PAST MEDICAL HISTORY:  1. Hypertension.  2. Hypercholesterolemia.  3. Cardiac murmur 3/6 left precordium.  Echo evaluation documents subaortic     membrane with a 25 mmHg gradient.  Ejection fraction approximately 65%.   PAST SURGICAL HISTORY:  1. Cornea transplant.  2. Partial hysterectomy.  3. The patient denies any complications with the above mentioned surgical     procedures.   The patient is a 65 year old black  female who appears to be fairly healthy  appearing.  States she does smoke about three cigarettes a day for the last  20 years.  She denies any alcohol use.  She is single.  Has several  children.  She is currently employed working as a Engineer, mining.  She lives  in a two-story house.   Family physician is Dr. Gaylan Gerold with [facility]; [phone].   FAMILY HISTORY:  Mother is alive with hypertension.  Father is deceased from  heart attack at 75 years of age.  One brother alive with hypertension and  glaucoma.   REVIEW OF SYSTEMS:  Positive for upper dentures, lower partial plates.  She  does wear glasses for reading and driving.  She does have occasional easy  bruising.  She does have occasional shortness  of breath with exertion, she  relates deconditioning.  She denies any chest discomfort or diaphoresis.  Otherwise review of systems are negative.   PHYSICAL EXAMINATION:  VITAL SIGNS:  Height is 5 feet 5 inches, weight 210  pounds, pulse 76 and regular, respirations 12, blood pressure 120/72.  The  patient is afebrile.  GENERAL:  This is a healthy-appearing black female.  She does ambulate with  an awkward gait due to knee pain.  She is able to get herself on and off the  exam table.  She is able to get herself in and out of a chair fairly easily.  HEENT:  Head normocephalic.  Pupils equal, round and reactive.  Left eye did  have corneal transplant placement.  Extraocular movements intact.  Sclerae  is not icteric.  Conjunctivae pink and moist.  External ears were without  deformities.  Gross hearing intact.  Nasal septum was midline.  Oral buccal  mucosa was pink and moist.  Dentition was in fair repair.  Upper dentures  were in place. The patient is able to swallow without any difficulty.  NECK:  Supple. No palpable lymphadenopathy.  Thyroid region was nontender.  She had good range of motion of her cervical spine without any difficulty or  tenderness.  She had no tenderness with percussion  along the entire spinal  column.  CHEST:  Lung sounds were clear and equal bilaterally.  No wheezes, rales,  rhonchi, rub noted.  HEART:  Regular  rate and rhythm. S1, S2 with a 3/6 systolic murmur over the  entire left precordium.  ABDOMEN:  Soft and nontender.  Bowel sounds normal active throughout.  CVA  region was nontender.  EXTREMITIES:  Upper extremities were symmetrically size and shape.  She had  full range of motion of her shoulders, elbows and wrists.  Motor strength is  5/5. Lower extremities:  Right and left hip and full extension, flexion up  to 90 degrees with no internal/external rotational  loss or discomfort.  Right knee was without any signs of erythema or ecchymosis.  No palpable  effusion.  She was tender on lateral joint line, none  medial.  She had  about 5 degree valgus, varus laxity.  She had full extension, flexion back  to 115 degrees.  Calf was nontender.  Left knee was without any signs of  erythema or ecchymosis.  No palpable effusion. She was nontender laterally  along the joint.  She had moderate crepitus on the patella.  She had full  extension, flexion back to 110 degrees with no significant instability.  The  calf was nontender.  Ankles were symmetric with good dorsi and plantar  flexion.  PERIPHERAL VASCULAR:  Carotid pulses 2+.  Radial pulses 2+.  Posterior  tibial pulses 1+.  Dorsalis pedis pulses 2+.  She had no lower extremity  edema or venous stasis changes.  NEURO:  The patient was conscious, alert and appropriate.  Easy conversation  with the examiner.  Cranial nerves II-XII grossly intact.  Deep tendon  reflexes of the upper and lower extremities were symmetrical right to left.  She was grossly intact to light touch sensation.  She had no gross  neurologic defects noted.  BREAST/RECTAL/GU:  Deferred at this time.   IMPRESSION:  1. End-stage osteoarthritis, left knee.  2. Hypertension. 3. Hypercholesterolemia.  4. Cardiac murmur 3/6.  Echo  examination subaortic membrane with a 25 mmHg     gradient.  Ejection fraction 65%.    PLAN:  The patient will undergo all routine labs and tests prior to having a  left total knee arthroplasty by Dr. Madelon Lips at [facility] on  October 13.  I have talked to Dr. Gaylan Gerold in reference to this patient.  He  does not feel she needs a further medical workup prior to this procedure.  He will monitor her cardiac murmur postoperatively with routine followup  echo.           Jamelle Rushing, Arnetha Courser, M.D.    RWK/MEDQ  D:  03/15/2003  T:  03/15/2003  Job:  [id number]

## 2010-11-02 NOTE — Op Note (Signed)
NAME:  Kelsey [name], Kelsey [name]                       ACCOUNT NO.:  [id number]   MEDICAL RECORD NO.:  [id number]                   PATIENT TYPE:  AMB   LOCATION:  ENDO                                 FACILITY:  [facility]   PHYSICIAN:  Georgiana Spinner, M.D.                 DATE OF BIRTH:  [birth year]   DATE OF PROCEDURE:  DATE OF DISCHARGE:                                 OPERATIVE REPORT   PROCEDURE:  Colonoscopy.   ANESTHESIA:  Demerol 20 mg.   DESCRIPTION OF PROCEDURE:  With the patient mildly sedated in the left  lateral decubitus position, the Olympus videoscopic colonoscope was inserted  in the rectum and passed under direct vision to the cecum identified by the  ileocecal valve and appendiceal orifice both of which were photographed.  From this point, the colonoscope was slowly withdrawn taking circumferential  views of the colonic mucosa stopping only in the rectum which appeared  normal on direct and retroflexed view. The endoscope was straightened and  withdrawn. The patient's vital signs and pulse oximeter remained stable. The  patient tolerated the procedure well without apparent complications.   FINDINGS:  Unremarkable colonoscopic examination; however, the photographs  were lost due to a problem with the printer but this was an unremarkable  exam.   PLAN:  See endoscopy note for further details.                                               Georgiana Spinner, M.D.    GMO/MEDQ  D:  09/21/2003  T:  09/21/2003  Job:  [id number]

## 2010-11-02 NOTE — H&P (Signed)
Kelsey [name], Kelsey [name]             ACCOUNT NO.:  [id number]   MEDICAL RECORD NO.:  [id number]          PATIENT TYPE:  INP   LOCATION:                               FACILITY:  [facility]   PHYSICIAN:  Mila Homer. Sherlean Foot, M.D. DATE OF BIRTH:  [birth year]   DATE OF ADMISSION:  08/06/2004  DATE OF DISCHARGE:                                HISTORY & PHYSICAL   CHIEF COMPLAINT:  Painful loss of range of motion, left total knee  arthroplasty.   HISTORY OF PRESENT ILLNESS:  The patient is a 65 year old black female with  a history of left total knee arthroplasty in 2004 with pain and loosening  components.  She had revision in February 2005.  The patient has continued  pain and locking in her knee.  She does note significant loss of range of  motion and difficulty with ambulation.  Workup shows a bone scan which shows  possible loosening of the components.  An x-ray showed loosening of the  femoral components, so the patient will undergo a revision of the left total  knee.   ALLERGIES:  No known drug allergies.   CURRENT MEDICATIONS:  1.  Hydrochlorothiazide 25 mg p.o. daily.  2.  Toprol XL 100 mg p.o. daily.  3.  Lipitor 200 mg p.o. daily.  4.  Multivitamin one tablet p.o. daily.  5.  Aspirin 81 mg p.o. daily.  6.  Prednisolone acetate ophthalmic solution 1%, 1 drop left eye t.i.d. to      q.i.d. p.r.n.  7.  Celebrex 200-mg tablets, 1 or 2 tablets p.o. daily p.r.n.   PAST MEDICAL HISTORY:  1.  Hypertension.  2.  Dyslipidemia.  3.  History of corneal transplant.   PAST SURGICAL HISTORY:  1.  Revision of left total knee arthroplasty in February 2005.  2.  Left total knee arthroplasty in October 2004.  3.  Left knee incision and drainage in 2004.  4.  Partial hysterectomy.  5.  Corneal transplant.  6.  The patient denies any complications of the above-mentioned surgical      procedure.   SOCIAL HISTORY:  The patient is a healthy-appearing, slightly heavyset, 35-  year-old black female  who smokes about 2 cigarettes a day and occasionally  uses alcohol.  She is single and lives in a 3-level home.  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   PHYSICIANS:  1.  Family physician is Dr. Ricki Miller at [facility].  2.  Cardiology with Dr. Aleen Campi with recent cardiac catheterization.   FAMILY HISTORY:  Mother is alive with hypertension.  Father is deceased at  41 of an MI.  One brother living with hypertension and glaucoma.   REVIEW OF SYSTEMS:  Positive for occasional short of breath with exertion.  She denies any cardiac-related symptoms.  No diaphoresis or chest pain.  She  feels it is mostly related to deconditioning and her difficulty with her  left  knee.  She does have occasional issues with reflux for which she uses  over-the-counter medications.  She does have partial dentures on the  upper  and lower.  She does wear glasses for driving.  Otherwise, all other review  of systems categories are negative.   PHYSICAL EXAMINATION:  VITAL SIGNS:  Height is 5 feet 4 inches.  Weight is  220 pounds.  Blood pressure is 158/82.  Pulse 60 and regular.  The patient  is afebrile.  Respirations are 12.  GENERAL:  This is a healthy-appearing, well-developed, slightly heavyset  black female who ambulates with a significant left-sided limp with  difficulty going through range of motion of that.  She has a difficult time  getting in and out of a chair due to the discomfort and the difficulty with  range of motion of her left  knee.  HEENT:  Head is normocephalic.  Pupils equal, round, and reactive.  Extraocular movements are intact.  Sclerae anicteric.  External ears without  deformities.  Gross hearing is intact.  Oral buccal mucosa is pink and  moist.  NECK:  Supple.  No palpable lymphadenopathy.  Thyroid region is nontender.  She has good range of motion of her cervical spine.  CHEST:  Lung sounds are clear and equal bilaterally.  No  wheezes, rales, or  rhonchi.  HEART:  Regular rate and rhythm.  No murmurs, rubs, or gallops.  ABDOMEN:  Round, obese, soft, and nontender.  Bowel sounds are normoactive.  EXTREMITIES:  Upper extremities are symmetrical in size and shape.  She has  good range of motion of her shoulders, elbows, and wrists.  Motor strength  is 5/5.  Lower extremities show right and left hips have full extension with  flexion up to 120 degrees, 20 degrees internal and external rotation without  any difficulty.  The left  knee has a well-healed midline surgical incision.  She has range of motion which is 0 to about 80 degrees.  She is tender.  She  has some medial to lateral varus-valgus laxity with the knee.  She is tender  throughout.  The calf is nontender.  The right knee has 0 to 115 degrees  range of motion.  She has some crepitus on the patella with this.  She has  no effusion.  No AP drawer.  She has some laxity with varus-valgus  stressing.  The calves are nontender.  The ankles have good dorsiflexion and  plantar flexion.  PERIPHERAL VASCULAR:  Carotid pulses are 2+ with no bruits.  Radial pulses  are 2+.  Dorsalis pedis pulses are 2+.  NEUROLOGIC:  The patient is conscious, alert, and oriented.  Cranial nerves  II through XII are grossly intact.  She has no gross neurologic defects  noted.  BREASTS, RECTAL, AND GENITOURINARY:  Exams are deferred.   IMPRESSION:  1.  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  2.  Hypertension.  3.  Hypercholesterolemia.  4.  History of corneal transplants.  5.  Negative cardiac catheterization in January 2006.   PLAN:  The patient will undergo all routine labs and tests prior to having a  revision of her left total knee arthroplasty by Dr. Sherlean Foot.  She did have a  previous H&P done earlier for an early January procedure, but this was canceled due to the fact that her primary care physician wanted a cardiac  evaluation.  This was  performed with a cardiac catheterization which was  found to be normal by Dr. Aleen Campi, and those reports were included with the  patient to the hospital.      ________________________________________  Belia Heman.  Tobey Bride.  ___________________________________________  Mila Homer Sherlean Foot, M.D.    RWK/MEDQ  D:  07/25/2004  T:  07/25/2004  Job:  [id number]

## 2010-11-02 NOTE — H&P (Signed)
Kelsey [name], Kelsey [name]             ACCOUNT NO.:  [id number]   MEDICAL RECORD NO.:  [id number]          PATIENT TYPE:  INP   LOCATION:  NA                           FACILITY:  [facility]   PHYSICIAN:  Mila Homer. Sherlean Foot, M.D. DATE OF BIRTH:  1945/11/01   DATE OF ADMISSION:  11/05/2004  DATE OF DISCHARGE:                                HISTORY & PHYSICAL   CHIEF COMPLAINT:  Right knee pain.   HISTORY OF PRESENT ILLNESS:  The patient is a 65 year old black female with  a history of left total knee arthroplasty. The patient has been having five  to six years of intermittent progressively worsening right knee pain. It is  a sharp stabbing-type pain with any ambulation or stares. It causes her to  give out. She has a weak feeling in the leg. The pain does radiate up and  down the entire leg. She has mechanical symptoms such as popping of the knee  with ambulation. X-rays reveal end-stage osteoarthritis of the knee.   ALLERGIES:  No known drug allergies.   CURRENT MEDICATIONS:  1.  Toprol XL 50 mg p.o. q.h.s.  2.  Geritol Complete one tablet p.o. daily.  3.  Lipitor 20 mg p.o. daily.  4.  Pred Forte one drop in left eye daily.  5.  Enteric-coated aspirin 81 mg p.o. daily.  6.  LA Weight Loss tablets.   PAST MEDICAL HISTORY:  1.  Hypertension.  2.  Hypercholesterolemia.  3.  3/6 left systolic ejection murmur left precardium, evaluated in      cardiology early in 2006 with a normal catheterization through [facility]      believed [facility].   PAST SURGICAL HISTORY:  1.  Left total knee arthroplasty.  2.  Hysterectomy.  3.  Corneal transplant.   The patient denies any complications of the above-mentioned surgical  procedures.   SOCIAL HISTORY:  The patient is a 65 year old black female with no history  of smoking or alcohol use.  She is single. She is currently medically  disabled due to her legs. She lives in a split level house, living  facilities upstairs. Primary care  physician is Dr. Ricki Miller.   FAMILY MEDICAL HISTORY:  Mother is alive at 41 years of age with history of  hypertension and glaucoma. Father is deceased from history of pulmonary  embolus at 46 years of age. One brother alive with visual issues and  hypertension.   REVIEW OF SYSTEMS:  Positive for glasses for reading and far distances.  Otherwise all her other review of systems categories are negative and  noncontributory if not mentioned above with the exception of partial upper  dentures.   PHYSICAL EXAMINATION:  VITAL SIGNS: Height is 5 feet 5 inches, weight is 216  pounds, blood pressure 150/88, pulse 60 and regular, respirations 12. The  patient is afebrile.  GENERAL: This is a healthy-appearing, fairly heavy set, black female who  ambulates with a slight right to left-sided limp. She usually gets herself  on and off the exam table without any distress.  HEENT: Head is normocephalic. Pupils equal, round, and reactive to  light.  Extraocular movements are intact. Sclerae nonicteric. External ears without  deformities. Gross hearing is intact. Oral buccal mucosa is pink and moist.  NECK: Supple with no palpable lymphadenopathy. Thyroid region is nontender.  She has good range of motion of her cervical spine.  CHEST: Lung sounds are clear and equal bilaterally. No rales, rhonchi, or  wheezes.  HEART: Regular rate and rhythm. A 3/6 systolic murmur heard throughout the  left precordium and up into the carotids.  ABDOMEN: Round, soft, and nontender. Bowel sounds normoactive. No CVA region  tenderness.  EXTREMITIES: Upper extremities are symmetric in size and shape. She has good  range of motion of her shoulders, elbows, and wrists. Motor strength is 5/5.  Lower extremities--right hip has full extension and flexion up to 110  degrees with 20 degrees of internal/external rotation without difficulty.  The right knee is gingerly able to full extend, flexes back to 110 degrees.  She has about a  10 degree valgus/varus laxity. She has coarse crepitus under  the patella with range of motion. She is tender along the medial and lateral  joint line. No effusion. The calf is soft and nontender. The left hip has  full extension and flexion up to about 100 degrees with 20 degrees of  internal/external rotation. The knee has a well-healed midline incision. It  is a typical mechanical clunking. It is about 5 degrees short of full  extension, flexes back to about 90 degrees. There is about a 5-degree  valgus/varus laxity, but there is no discomfort, no effusion. The calf is  soft and nontender. The ankles are symmetrical with good dorsal plantar  flexion.  PERIPHERAL VASCULAR: Carotids pulses are 2+ , no bruits. Radial pulses are  2+. She has 1+ dorsalis pedis pulses bilaterally. No lower extremity edema  or venous stasis changes.  NEUROLOGIC: The patient is conscious, alert, and appropriate. Ease of  conversation ____________. Cranial nerves II-XII are grossly intact. She has  no gross neurologic deficits noted.  BREASTS/RECTAL/GU EXAM: Deferred.   IMPRESSION:  1.  End-stage renal disease osteoarthritis, right knee.  2.  Hypertension.  3.  Hypercholesterolemia.  4.  A 3/6 systolic murmur left precordium with a cardiac workup early in      2006, catheterization reported normal; believed through [facility]      Cardiology.   PLAN:  The patient will undergo all routine labs and tests prior to having a  right total knee arthroplasty by Dr. Sherlean Foot at [facility] on May  22nd.      RWK/MEDQ  D:  10/30/2004  T:  10/30/2004  Job:  [id number]

## 2010-11-02 NOTE — Discharge Summary (Signed)
NAME:  Kelsey [name], Kelsey [name]                       ACCOUNT NO.:  [id number]   MEDICAL RECORD NO.:  [id number]                   PATIENT TYPE:  INP   LOCATION:  5027                                 FACILITY:  [facility]   PHYSICIAN:  Dyke Brackett, M.D.                 DATE OF BIRTH:  1945-07-25   DATE OF ADMISSION:  03/30/2003  DATE OF DISCHARGE:  04/02/2003                                 DISCHARGE SUMMARY   ADMISSION DIAGNOSES:  1. End stage osteoarthritis, left knee.  2. Hypertension.  3. Hypercholesterolemia.  4. Cardiac murmur 3/6. Echocardiogram examination subaortic membrane with a     25 mm gradient, ejection fraction of 65.   DISCHARGE DIAGNOSES:  1. Left total knee arthroplasty.  2. Postoperative blood loss anemia.  3. Hypertension.  4. Hypercholesterolemia.  5. Cardiac murmur 3/6. Echocardiogram examination subaortic membrane with a     25 mm gradient, ejection fraction 65.   HISTORY OF PRESENT ILLNESS:  The patient is a 65 year old black female with  severe left knee pain. The pain has progressively worsened over the last 4  years. She also has difficulty with range of motion. The pain is a constant  deep ache with sharp pains with awkward movements and weight bearing. She  does have night pain. She does have swelling. She does have popping and  catching. The pain stays within the knee with no radiation up and down the  leg.   ALLERGIES:  No known drug allergies.   CURRENT MEDICATIONS:  1. Celebrex 200 mg p.o. q.d.  2. Prednisolone 1% a.c. left eye 1 drop 3 to 4 times a day.  3. Lipitor 10 mg p.o. q.d.  4. Toprol XL 100 mg p.o. q.d.  5. Centrum multivitamins 1 tablet a day.  6. Aspirin 81 mg p.o. q.d.   SURGICAL PROCEDURE:  On March 30, 2003 the patient was taken to the OR by  Dr. Frederico Hamman, M.D. Assisted by Alba Cory, P.A.C. Under general  anesthesia, the patient underwent a left total knee arthroplasty with an LCS  rotating platform, standard plus femur and  patella, a 2.5 mm tibial tray,  and a 20 mm bearing. The patient received postoperative femoral nerve block  and was transferred to the recovery room in good condition with 1 Hemovac  drain left in place.   CONSULTATIONS:  1. Physical therapy.  2. Occupational therapy.  3. Case management.  4. Rehabilitation evaluation.  5. An [facility] hospitalist consultation was requested due to the patient's     hypertensive blood pressures immediate postoperative for management.   HOSPITAL COURSE:  On March 30, 2003 the patient was admitted to [facility] under the care of Dr. Frederico Hamman, M.D. The patient was taken to  the OR where a left total knee arthroplasty was performed. The patient  tolerated the procedure well. There are no complications. The patient was  transferred to the  recovery room  and then to the orthopedic floor for  routine postoperative care. The patient was placed on Lovenox for DVT  prophylaxis.  The patient then incurred a total of 3 days postoperative care  in which the patient initially did have some issues with some elevated blood  pressures immediate postoperative in the 160 to 90 range. A [facility]  hospitalist consultation was requested for medical coverage and evaluation  was performed. This was felt related to the withholding of her typical  Toprol medication that day and the stress of the surgery but no significant  changes were made and the patient was continued on her routine medications  without any other significant events occurring with stabilization of her  blood pressure and other vital signs. The patient also did develop some  postoperative blood loss anemia with her hemoglobin dropping to 7.6 on  postoperative day 2. Her blood pressure was 103/57, so she was typed and  crossed and transfused 2 units of packed red blood cells without any  complicating issues and improvement of her hemoglobin to 9.2 the next  postoperative day.   From the orthopedic  standpoint, the patient did very well. Her leg remained  neurologic, motor, and vascularly intact. She had no significant temperature  issues. No sources of infection. Her wound remained benign for any signs of  infection in her leg. Had no calf tenderness. The patient worked well with  physical therapy and with the use of the CPM and was felt to be both  orthopedically and medically stable to be discharged on postoperative day 3  to home with routine home health physical therapy protocol arranged. The  patient was discharge to home in good condition.   LABORATORY DATA:  EKG on admission was sinus bradycardia. Minimal voltage  criteria for LVH at 57 beats per minute.   CBC on April 02, 2003 white blood cell count 10.1, hemoglobin 9.2 up from  a low of 7.6. Hematocrit of 26.9, platelets 204,000. Routine chemistries on  April 01, 2003 sodium 138, potassium of 3.6, glucose of 115, BUN 7,  creatinine 0.6. Lipid profile on March 31, 2003 cholesterol 174.  Triglycerides 61, HDL 67, total cholesterol HDL ratio 2.6. VLDL  12, LDL 95.  Urinalysis on admission was normal. The patient received a total of 2 units  of packed red blood cells during her hospitalization.   DISCHARGE MEDICATIONS:  1. Metoprolol 100 mg p.o. q.d.  2. Celebrex 200 mg p.o. q.d.  3. Zocor 20 mg p.o. q.d.  4. Multivitamins 1 tablet p.o. q.d.  5. Pred Forte eye drops 1 drop OS q.i.d.  6. Colace 100 mg p.o. b.i.d.  7. Trinsicon  1 tablet p.o. t.i.d.  8. Laxative or enema of choice p.r.n.  9. Reglan 10 mg p.o. q. 8 hours p.r.n.  10.      Percocet 1 or 2 tablets every 4 to 6 hours p.r.n.  11.      Tylenol 650 mg p.o. q. 4 hours p.r.n.  12.      Robaxin 500 mg p.o. q. 6 hours p.r.n.  13.      Restoril 15 mg p.o. q.h.s. p.r.n.  14.      Lovenox 30 mg subcutaneous q. 12 hours.  15.      OxyContin CR 10 mg p.o. q. 12 hours.   DISCHARGE INSTRUCTIONS:  1. The patient is to resume home medications except for aspirin. 2.  Trinsicon 1 tablet every day for 30 days.  3.  Lovenox 1 injection a day.  4. OxyContin CR 10 mg every 12 hours for pain.  5. Percocet 5 mg 1 or 2 tablets every 4 to 6 hours for pain.  6. Activity partial weight bearing 50%. Weight to the left leg with home     health physical therapy by Genevieve Norlander.  7. Diet, no restrictions.  8. Wound care keep wound clean and dry. Call Dr. Candise Bowens office if     temperature is above 101.5, chills, swelling, foul smelling discharge or     pain not controlled with pain medications.   SPECIAL INSTRUCTIONS:  CPM 0 to 60 degrees a day, 6 to 8 hours a day.   FOLLOW UP:  Call for a followup appointment with Dr. Madelon Lips in [phone] days [phone]). Please make an appointment.   CONDITION ON DISCHARGE:  Improved and good.      Jamelle Rushing, Arnetha Courser, M.D.    RWK/MEDQ  D:  05/10/2003  T:  05/10/2003  Job:  [id number]   cc:   Dr. Rose Fillers [facility]

## 2010-11-02 NOTE — H&P (Signed)
NAME:  Kelsey [name], Kelsey [name]                       ACCOUNT NO.:  [id number]   MEDICAL RECORD NO.:  [id number]                   PATIENT TYPE:  AMB   LOCATION:  DSC                                  FACILITY:  [facility]   PHYSICIAN:  Dyke Brackett, M.D.                 DATE OF BIRTH:  28-May-1946   DATE OF ADMISSION:  08/01/2003  DATE OF DISCHARGE:                                HISTORY & PHYSICAL   CHIEF COMPLAINT:  Painful left total knee arthroplasty, possibly infected.   HISTORY OF PRESENT ILLNESS:  This is a 65 year old African-American female  who underwent a left total knee arthroplasty on March 30, 2003. The  patient developed a superficial skin infection in early November 2004. The  patient underwent I&D and aspiration of the knee as an outpatient under  general anesthesia. Cultures from the knee grew out nothing deep.  Superficial cultures grew out rare staph. The patient was placed on Keflex  antibiotics. She has continued to have knee pain with limited range of  motion. She describes the knee pain as a diffuse pain throughout the entire  knee. She is unable to sit for prolonged periods of time due to the knee  pain. The knee feels overall weak. The pain does keep her awake at night.  She takes 2-3 Vicodin per day.  CT of the left knee show possible loosening  of the tibial tray. Bone scan of the left knee showed increased soft tissue  uptake as well as increased delayed activity surrounding the entire left  knee prosthesis. The patient is to be admitted to [facility] on  Monday, August 01, 2003 to undergo a left knee exploration with probable  excision of implant and possible revision of the left total knee  replacement.   ALLERGIES:  No known drug allergies, however she is sensitive to  cephalosporin.   MEDICATIONS:  1. Celebrex 300 mg p.o. daily.  2. Prednisolone AC 1% one drop to the left eye t.i.d. to q.i.d.  3. Lipitor 10 mg p.o. daily.  4. Toprol XL 100 mg  daily.  5. Centrum multivitamin daily.  6. Aspirin 81 mg daily.   PAST MEDICAL HISTORY:  Positive for hypertension, hypercholesterolemia,  cardiac murmur 3/6 left pericardium. Echo evaluation document says subaortic  membrane with 25 mm HD gradient. Ejection fraction approximately 65%.   PAST SURGICAL HISTORY:  1. Left total knee arthroplasty March 30, 2003.  2. Left total knee I&D, aspiration, and pulse lavage April 21, 2003.  3. Partial hysterectomy.  4. Corneal transplant.  5. The patient denies any complications with any of the above procedures.     She did receive two units of autologous blood with the left total knee     arthroplasty.   SOCIAL HISTORY:  The patient smokes about three cigarettes a day for the  last 20 years. She denies any alcohol use. She is single, has several  children. Currently is on disability. Lives in a two story house.   PRIMARY CARE PHYSICIAN:  Juline Patch, M.D., [facility],  phone number [phone].   FAMILY HISTORY:  The patient's mother is alive with hypertension. The father  deceased at age 81 with an MI. She has one living brother with hypertension  and glaucoma.   REVIEW OF SYSTEMS:  Positive for upper denture, lower partial plate. The  patient wears glasses for reading and driving. Bruises easily. She has  shortness of breath with exertion. She relates this to deconditioning and to  her left knee pain. The patient denies any chest pain, PND, or orthopnea.  She denies any GI, GU, neurologic, or hematologic conditions. She does have  some discomfort in both of her shoulders from time to time.   PHYSICAL EXAMINATION:  VITAL SIGNS:  Height 5 feet 5 inches, weight 210  pounds. Pulse 70, respirations 14, blood pressure 132/80, temperature 97.8.  GENERAL:  The patient is a healthy appearing African-American female and  walks with an antalgic gait. The patient is able to get on and off of the  exam table by herself.  HEENT:   Normocephalic, atraumatic without frontal or maxillary sinus  tenderness. Pupils are round and reactive to light and accommodation.  Extraocular movements are intact. Sclerae is nonicteric. No visible ear  deformities. TMs are obscured due to heavy cerumen. Nose and nasal septum  midline. Nasal mucosa is pink, moist, and without polyps. Buccal mucosa is  pink and moist. The patient has upper dentures and a partial in the lower.  Oropharynx without erythema or exudate. Tongue and uvula midline.  NECK:  Trachea midline. No lymphadenopathy. The carotids are 2+ without  bruits. There is no tenderness with palpation of the cervical spine. She has  full range of motion of the cervical spine.  BACK:  Nontender to palpation of the thoracic and lumbar spine.  BREAST/GENITAL/URINARY/RECTAL:  All deferred at this time.  RESPIRATORY:  Lungs are clear bilaterally to auscultation. There is no  wheezing, rales, or rhonchi noted on auscultation.  HEART:  Regular rate and rhythm with a 3/6 systolic murmur over the entire  left pericardium.  ABDOMEN:  Obese, soft, nontender. Bowel sounds normal and active throughout.  NEUROLOGIC:  The patient is alert and oriented x3. Cranial nerves 2-12 are  grossly intact. Strength testing in the upper and lower extremities reveals  5/5 in the upper and lower extremities against resistance except for the  left knee, which is 4/5 against resistance.  MUSCULOSKELETAL:  The upper extremities are equal in size and shape. The  patient has full range of motion of the shoulders, elbows, wrists. Radial  pulses are 2+. The lower extremities with full range of motion of the hips  bilaterally. Internal and external rotation of each hip causes pain in the  corresponding knee. The right knee is non edematous, no ecchymosis. No  effusion. No pain with palpation. Full extension, 100 degrees flexion. Valgus, varus stressing reveals no laxity. McBurney's is negative. Left knee  0-80  degrees flexion. Pain with forced flexion. There is keloid formation  over the proximal two-thirds of her knee incision. No effusion, ecchymosis,  or erythema is noted. Valgus varus stressing reveals no laxity. Calf is  nontender. The lower extremities are without edema. Dorsal pedal pulses are  2+ bilaterally.   IMPRESSION:  1. Status post left total knee now with a possible infected knee versus     loosening.  2. Hypertension.  3. Hypercholesterolemia.  4. Cardiac murmur 3/6. Echo examination subaortic membrane with a 25 mm     __________ gradient. Ejection fraction is 65%.   PLAN:  The patient is to be admitted to [facility] on August 01, 2003 and at that time undergo exploration of probable excision of total knee  prosthesis with possible revision. The patient is to undergo all  preoperative labs and testing prior to surgery. In addition, the patient  will have a C. reactive protein and a sed. rate drawn prior to surgery.      Richardean Canal, Arnetha Courser, M.D.    GC/MEDQ  D:  07/26/2003  T:  07/26/2003  Job:  [id number]

## 2010-11-02 NOTE — H&P (Signed)
Kelsey [name], Kelsey [name]             ACCOUNT NO.:  [id number]   MEDICAL RECORD NO.:  [id number]          PATIENT TYPE:  INP   LOCATION:  NA                           FACILITY:  [facility]   PHYSICIAN:  Mila Homer. Sherlean Foot, M.D. DATE OF BIRTH:  [birth year]   DATE OF ADMISSION:  06/25/2004  DATE OF DISCHARGE:                                HISTORY & PHYSICAL   CHIEF COMPLAINT:  Painful left total knee arthroplasty.   HISTORY OF PRESENT ILLNESS:  Kelsey [name] is a 65 year old African American  female with left knee pain who has since undergone the left total knee  arthroplasty in October of 2004.  The patient underwent a subsequent left  knee revision of the tibial component in February of 2005.  However, she  continues to have left knee pain with locking and catching sensation in the  knee.  She describes the pain as a constant achy pain that is made worse  with ambulation.  She takes Aleve or Celebrex on an occasional basis, but  never on the same day of the pain; this gives her some pain relief.  The  patient uses no assistive devices to ambulate.  Bone scan of the left knee  shows loosening of the implant.  X-rays show femoral component with an  anterior phalangeal radiolucent line.  The patient is to undergo left total  knee revision on June 25, 2004 at [facility].   ALLERGIES:  No known drug allergies.   MEDICATIONS:  1.  Toprol-XL 100 mg 1 p.o. daily.  2.  Hydrochlorothiazide 5 mg 1 daily.  3.  Lipitor 20 mg 1 daily.  4.  Centrum multivitamin 1 p.o. daily.  5.  Aspirin 81 mg 1 p.o. daily, stopped on June 19, 2004.  6.  Prednisolone acetate ophthalmic solution 1% in left eye daily.   PAST MEDICAL HISTORY:  Past medical history positive for:  1.  Hypertension.  2.  Dyslipidemia.  3.  A 3/6 cardiac murmur.   PAST SURGICAL HISTORY:  1.  Left total knee arthroplasty, October 2004.  2.  Left knee I&D, November 2004.  3.  Left total knee revision, February 2005.  4.   Partial hysterectomy.  5.  Corneal transplant.   SOCIAL HISTORY:  The patient states that she smokes 2 cigarettes a day,  occasionally uses alcohol.  She is single and lives in a 3-level house.  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   FAMILY PHYSICIAN:  Family physician is Dr. Juline Patch, [facility]; phone number is [phone].   FAMILY HISTORY:  The patient's mother is alive with only known health  problem being hypertension.  Father deceased at age 52 with an MI.  She has  1 brother who is living and has hypertension and glaucoma.   REVIEW OF SYSTEMS:  The patient denies any chest pain, PND or orthopnea.  She does have shortness of breath with exertion, especially when climbing  stairs.  She denies any recent cold, cough, fever or flu-like symptoms.  She  wears glasses for driving, has partials on  top and bottom.  She has  occasional gastric reflux.  Otherwise, she denies any other GI, GU,  neurologic or hematologic medical conditions.   PHYSICAL EXAM:  GENERAL:  The patient is a well-developed, well-nourished  female who walks with a very slow antalgic gait and limp on the left.  VITAL SIGNS:  The patient's height is 5 foot 4 inches, weight 218 pounds.  Pulse 58.  Blood pressure is 166/107 with the automatic cuff; manually, her  blood pressure is 160/90.  Temperature is 97.3.  Respiratory rate is 14.  HEENT:  Head is normocephalic, atraumatic, without frontal or maxillary  sinus tenderness to palpation.  PERRLA.  Extraocular movements intact.  Sclerae are nonicteric.  Conjunctivae are pink and moist.  No visible  external ear deformities are noted.  TMs are pearly and gray bilaterally.  Nose:  Nasal septum midline.  Nasal mucosa is pink and moist.  She does have  slight erythemic tissue in the left naris.  There are no polyps.  Buccal  mucosa is pink and moist.  The patient has good dentition.  Pharynx without  any erythema or  exudate.  Tongue and uvula midline.  NECK:  Neck supple with no lymphadenopathy.  Carotids are 2+ without bruits.  She has full range of motion in the cervical spine.  Palpation of the  cervical spine reveals no tenderness.  BACK:  Nontender to palpation over the thoracic or lumbar spine.  CARDIAC:  Regular rate and rhythm with a systolic murmur heard throughout.  RESPIRATORY:  Lungs are clear to auscultation bilaterally.  No wheezing,  rhonchi or rales noted.  ABDOMEN:  Abdomen obese, soft, nontender.  Positive bowel sounds throughout.  NEUROLOGIC:  The patient is alert and oriented x3.  Cranial nerves II-XII  are grossly intact.  Strength testing in lower extremities reveals 5/5  strength against resistance.  Reflexes at ankles, knees, brachial, radial  are equal and symmetric and are 2+ bilaterally.  MUSCULOSKELETAL:  Upper extremities are symmetric in size and shape.  She  has full range of motion of the shoulders, elbows and hands.  Radial pulses  are 2+ bilaterally.  LOWER EXTREMITIES:  The patient has excellent range of motion of both hips  without pain.  Knees:  Left knee -- 0-85 degrees of flexion.  She has mild  edema throughout the left knee.  No evidence of effusion.  Surgical incision  is well-healed.  She has tenderness with palpation over the knee.  Valgus  and varus stressing reveals no evident laxity.  Right knee -- 0-100 degrees  with a moderate amount of crepitus with passive range of motion.  Palpation  of the joint line reveals tenderness in the medial compartment.  No effusion  and no edema are noted.  Valgus and varus stressing reveals no laxity.  Anterior drawer sign is negative.  Lower extremities are non-edematous  throughout the remainder of the lower extremities.  Dorsalis pedal pulses  are 2+ bilaterally.  GENITOURINARY AND RECTAL:  Exams all deferred at this time.   IMPRESSION: 1.  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  2.  Hypertension.  3.  Hypercholesterolemia.  4.  Cardiac murmur.  5.  Right knee pain most likely due to osteoarthritis.   PLAN:  1.  The patient is to be admitted to [facility] on June 25, 2004      to undergo a left total  knee arthroplasty.  2.  The patient is to follow up with Dr. Ricki Miller about her hypertension      preoperatively.  She reports that she is to call him and let him know      our findings in regards to her blood pressure on this preoperative exam.  3.  The patient will undergo all preoperative labs and testing prior to      surgery.       GC/MEDQ  D:  06/19/2004  T:  06/19/2004  Job:  [id number]

## 2010-11-02 NOTE — Discharge Summary (Signed)
NAMEHARIKA, LAIDLAW NO.:  [id number]   MEDICAL RECORD NO.:  [id number]          PATIENT TYPE:  INP   LOCATION:  5013                         FACILITY:  [facility]   PHYSICIAN:  Mila Homer. Sherlean Foot, M.D. DATE OF BIRTH:  April 01, 1946   DATE OF ADMISSION:  11/05/2004  DATE OF DISCHARGE:  11/08/2004                                 DISCHARGE SUMMARY   ADMITTING DIAGNOSES:  1.  End-stage osteoarthritis, right knee.  2.  Hypertension.  3.  Hypercholesterolemia.  4.  A 3/6 systolic murmur, left pericardium with cardiac workup earlier in      2006.  Catheterization report normal, thought to be through [facility]      Cardiology.   DISCHARGE DIAGNOSES:  1.  Status post right total knee arthroplasty.  2.  Acute blood loss anemia secondary to surgery.  3.  Hypokalemia, treated.  4.  Volume hypertension.  5.  A 3/6 systolic murmur.  6.  Hypercholesterolemia.   HISTORY OF PRESENT ILLNESS:  Kelsey [name] is a 65 year old African-American  female with a history of left total knee arthroplasty.  The patient's right  knee pain became progressively worse over the past five to six years.  She  has intermittent pain.  The pain is a sharp and stabbing-type pain with  ambulation or stairs.  Her knee does give way at times.  The pain radiates  up and down the leg.  She has an occasional popping sensation in the knee  with ambulation.  X-rays revealed end-stage osteoarthritis of the right  knee.   ALLERGIES:  No known drug allergies.   MEDICATIONS:  1.  Toprol-XL 50 mg p.o. q.h.s.  2.  Geritol Complete 1 tab p.o. daily.  3.  Lipitor 20 mg p.o. daily.  4.  Pred Forte 1 drop in the left eye daily.  5.  Enteric-coated aspirin 81 mg p.o. daily.  6.  LA Weight Loss tablets.   SURGICAL PROCEDURE:  The patient was taken to the operating room on Nov 05, 2004, by Dr. Georgena Spurling, assisted by Richardean Canal, P.A.  The patient was  placed under general anesthesia and the right total knee  arthroplasty was  performed.  The following components were used:  Size 4 tibia, size D femur,  a 10-mm tibial bearing, and a all-poly patella size 35 diameter, 9-mm  thicken, all cemented.  The patient tolerated the procedure well and was  returned to recovery in good stable condition.   HOSPITAL COURSE:  On postoperative day one, the patient was afebrile.  Vital  signs stable.  The patient's H&H was 9.2 and 27.5.  UA was repeated due to  questionable contamination on admission UA.  On postoperative day two, the  patient's H&H dropped to 7.6 and 23.1; therefore patient was transfused with  two units of packed red blood cells.  The patient's blood pressure was  112/47 with a heart rate of 73. Respiratory rate was 20.  O2 saturation 92%.  The patient was found to be hypokalemic and therefore potassium was  replaced.  Repeat UA was negative.  On postoperative day three, the  patient  was afebrile, vital signs were stable, progressing well with physical  therapy.  Range of motion was 0-75 degrees.  The patient was to work with  physical therapy and after working with therapy, it was felt that all the  goals were met, and she was ready for discharge home.  Therefore she was  discharged home in good stable condition.   ROUTINE LABS ON ADMISSION:  1.  CBC on admission:  All values within normal limits.  2.  Coags on admission:  All values within normal limits.  3.  Routine chemistries on admission:  All values were within normal limits.  4.  Hepatic enzymes on admission:  AST 26, ALT 29, ALP high at 168, total      bilirubin 0.8.  5.  Urinalysis on admission showed 11-20 WBCs, bacteria many, large      leukocyte esterase, few epithelial cells, granular casts were noted.  6.  Urine culture on Nov 08, 2004, showed no growth.  7.  Urinalysis on Nov 06, 2004, was negative.   DISCHARGE INSTRUCTIONS:   DISCHARGE MEDICATIONS:  1.  Percocet 5 mg 1-2 tabs every 4-6 hours for pain.  2.  Lovenox 40  mg subcu injection once a day for 11 days.  3.  Otherwise, the patient was to resume home medications except for aspirin      while on Lovenox.   ACTIVITY:  Weightbearing as tolerated with walker.   DIET:  No restrictions.   WOUND CARE:  The patient is to keep wound clean and dry.  May shower on  Friday.  If any concerns, the patient is to call Dr. Tobin Chad office for  advice.   FOLLOWUP:  The patient is to call [phone] for followup appointment in 10  days.      Bronson Curb   GC/MEDQ  D:  01/25/2005  T:  01/26/2005  Job:  [id number]

## 2010-11-02 NOTE — Op Note (Signed)
   NAME:  Kelsey [name], Kelsey [name]                       ACCOUNT NO.:  [id number]   MEDICAL RECORD NO.:  [id number]                   PATIENT TYPE:  AMB   LOCATION:  DSC                                  FACILITY:  [facility]   PHYSICIAN:  Thera Flake., M.D.             DATE OF BIRTH:  [birth year]   DATE OF PROCEDURE:  04/20/2003  DATE OF DISCHARGE:                                 OPERATIVE REPORT   INDICATIONS FOR PROCEDURE:  Possible early infection of left total knee with  partial wound dehiscence.   OPERATION:  1. Arthroscopic lavage.  2. Incision and drainage left knee.   SURGEON:  Dyke Brackett, M.D.   ANESTHESIA:  General.   DESCRIPTION OF PROCEDURE:  Sterile prep and drape.  She had superficial  wound dehiscence with no obvious purulence.  Prior to this at a nonaffected  area, arthroscopy portals were created inferolateral and superomedial.  This  was productive of about 10 mL of bloody straw colored fluid that was sent  for cell count, gram stain, and culture.  Due to the fact that a deep  aspirate was obtained with the arthroscopic instruments, it was elected to  lavage the knee out with 6000 mL to be absolutely certain that if there was  an potential infection that it would be eradicated.  The knee did have an  overall somewhat warm quality to it, as well.  Once this was concluded,  these portals were closed with nylon.  Attention was next directed to the  superior 1/5 of the wound which had some superficial wound dehiscence.  She  appeared to be having some purulent drainage in the office but this did not  appear to be as significant in the operating room.  However, the edges of  the wound were aggressively debrided, Vicryl sutures were removed.  This did  not appear to be going deeply into the joint.  Copious irrigation was  carried out again and closure with interrupted 3-0 nylon was carried out.  A  lightly compressive sterile dressing and knee immobilizer were placed.   She  was taken to the recovery room in stable condition.                                               Thera Flake., M.D.    WDC/MEDQ  D:  04/20/2003  T:  04/20/2003  Job:  [id number]

## 2010-11-02 NOTE — Op Note (Signed)
NAMEZIGGY, REVELES NO.:  [id number]   MEDICAL RECORD NO.:  [id number]          PATIENT TYPE:  INP   LOCATION:  5033                         FACILITY:  [facility]   PHYSICIAN:  Mila Homer. Sherlean Foot, M.D. DATE OF BIRTH:  [birth year]   DATE OF PROCEDURE:  08/06/2004  DATE OF DISCHARGE:                                 OPERATIVE REPORT   PREOPERATIVE DIAGNOSIS:  Failed right total knee arthroplasty.   POSTOPERATIVE DIAGNOSIS:  Failed right total knee arthroplasty.   OPERATION PERFORMED:  Right revision total knee arthroplasty.   SURGEON:  Mila Homer. Sherlean Foot, M.D.   ASSISTANT:  Legrand Pitts. Duffy, P.A.   ANESTHESIA:  General.   INDICATIONS FOR PROCEDURE:  The patient is a 65 year old black female with  painful total knee arthroplasty.  Informed consent was obtained for  revision.   DESCRIPTION OF PROCEDURE:  The patient was laid supine and administered  general anesthesia.  The right lower extremity was prepped and draped in the  usual sterile fashion.  A #10 blade was used to make an incision through the  old incision.  A synovectomy was performed  and there was lots of  arthrofibrosis.  Preoperative motion was only 0 to 80 degrees.  Medial and  lateral gutter were debrided with rongeurs and sterile blades.  I then was  able to elevate the medial collateral ligament off the medial crest of the  tibia and amputate the LCS bearing and remove it safely.  I then went into  flexion.  I did have to perform a quadriceps snip.  I then was able to use a  small sagittal saw and easily create an interface between the bone and  cement on the femoral component.  I then used revision osteotomes and was  easily able to remove the femoral component.  After that I then turned my  attention to the tibial component where I likewise used a small sagittal saw  and revision osteotomes and then could remove that with relative ease.  I  then removed the excess cement on both the femoral and  tibial cut surfaces.  I debrided all soft tissue as well.  I got down to good bleeding bone.  At  this point I assessed the femur to be a size E.  I then sequentially reamed  by hand up to 22 mm on the femur and 18 mm on the tibia.  I then templated  on the femur with a size E block.  I then finished the femur cutting to  distal 5 augment with trial in place and then this fit quite nicely.  I then  turned my attention to the tibia where I used a size 3 tibial tray, drill  and keel. I then trialed with the size 18 stem, size 3 tibia with a 5 mm  block augment to build up the tibia since we had already reached maximal  thickness in the polyethylene bearing.  I then placed the E femoral  component in place and a 14 LCS bearing allowed excellent flexion extension  gap balance and overall alignment.  I then copiously  irrigated after  removing the components and then cemented in all components.  I did not have  to change out the patella.  At this point I repaired the quadriceps snip  while the cement was hardening.  I left a Hemovac coming out superolaterally  deep to the arthrotomy.  I then repaired the arthrotomy with interrupted  figure-of-eight #1 Vicryl sutures.  At this point I could easily drop and  dangle the knee back to approximately 95 without any tension on the stitches  or the quadriceps snip repair.  I then closed the deep soft tissues with  interrupted 0 Vicryl, subcuticular 2-0 Vicryl and skin staples.  I then  dressed with Adaptic, 4 x 4, sterile Webril and TED stocking.   COMPLICATIONS:  None.   DRAINS:  One Hemovac.   ESTIMATED BLOOD LOSS:  500 cc.   TOURNIQUET TIME:  Two hours.      SDL/MEDQ  D:  08/06/2004  T:  08/07/2004  Job:  [id number]

## 2010-11-02 NOTE — Discharge Summary (Signed)
Kelsey [name], CALVERT NO.:  [id number]   MEDICAL RECORD NO.:  [id number]          PATIENT TYPE:  INP   LOCATION:  5033                         FACILITY:  [facility]   PHYSICIAN:  Mila Homer. Sherlean Foot, M.D. DATE OF BIRTH:  [birth year]   DATE OF ADMISSION:  08/06/2004  DATE OF DISCHARGE:  08/09/2004                                 DISCHARGE SUMMARY   ADMITTING DIAGNOSES:  1.  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  2.  Hypertension.  3.  Hypercholesterolemia.  4.  History of corneal transplant.  5.  Negative cardiac catheterization in January 2006.   DISCHARGE DIAGNOSES:  1.  Status post left total knee revision.  2.  Acute blood loss anemia secondary to surgery, asymptomatic.  3.  Hypokalemia, treated.  4.  History of hypertension.  5.  History of hypercholesterolemia.  6.  History of corneal transplant.  7.  Negative cardiac catheterization in January 2006.   HISTORY OF PRESENT ILLNESS:  Kelsey [name] is a 65 year old female with a  history of left total knee arthroplasty in 2004 which is painful despite  undergoing revision of the knee in February 2005.  The patient continues to  have pain and locking in the left knee.  She also has significant loss of  range of motion, definitely with ambulation.  A bone scan showed possible  loosening of the prosthetic components of the left knee.  X-rays showed  loosening of the femoral component.  Therefore, the patient elected to  undergo a second revision of left total knee.   ALLERGIES:  No known drug allergies.   MEDICATIONS:  1.  Hydrochlorothiazide 25 mg one p.o. daily.  2.  Toprol XL 100 mg one p.o. daily.  3.  Lipitor _____ mg one p.o. daily.  4.  Multivitamins 1 tab p.o. daily.  5.  Aspirin 81 mg 1 p.o. daily.  6.  Celebrex 200 mg 1-2 tablets p.o. daily.  7.  Prednisolone acetate ophthalmic solution 1% 1 drop left eye t.i.d. to      q.i.d. p.r.n.   SURGICAL PROCEDURE:  The patient  was taken to the operating room on August 06, 2004 and placed under general anesthesia.  Also received a femoral nerve  block and underwent a left total knee arthroplasty revision.  Surgeon:  Georgena Spurling, M.D.  Assistant:  Arnoldo Morale, P.A.  The following components  were used:  A size E femoral augment applied with 5 mm block augment.  Size  3 tibia, with a size 18 stem, and a 5 mm block augment, 14 mm bearing.  The  patella was not revised.  The patient tolerated the procedure well and  returned to the recovery room in good stable condition.   CONSULTS:  The following consults were obtained while the patient was  hospitalized:  1.  PT/OT.  2.  Case management.   HOSPITAL COURSE:  The patient developed acute blood loss anemia secondary to  surgery, however remained asymptomatic and on the day of discharge H&H was  8.6 and 24.3.  The patient was afebrile, vital signs stable.  The  patient  also developed hypokalemia.  This was treated.  The patient was discharged  home in good stable condition on postop day three.   LABORATORIES:  Routine labs on admission and CBC:  All values within normal  limits on admission.  Coags:  All values within normal limits on admission.  Routine chemistries:  All values within normal limits on admission.  Hepatic  enzymes on admission:  AST 30, ALT 48, elevated, ALP 162, elevated.  Total  bilirubin was 0.4.   Include cultures from left knee showed no growth after three days.  Anaerobic cultures:  No anaerobes isolated.  Urinalysis on admission was  negative.   ECG dated June 19, 2004 showed heart rate 62 beats per minute, PR interval  176 msec, PRT axis 59/37.   DISCHARGE INSTRUCTIONS:   MEDICATIONS:  The patient is to resume home medications except her aspirin  while on Lovenox.  The following medications were to be added:  1.  Lovenox 40 mg 1 injection daily at 10:00 a.m. x 11 days.  Resume aspirin      after Lovenox dose is finished.  2.   Percocet 5/325, 1-2 tablets q.4-6 h. p.r.n. pain.  3.  K-Dur 40 mEq b.i.d. x 3 days.  4.  Iron 325, 1 tab daily x 30 days.   ACTIVITY:  Patient weightbearing as tolerated with walker.  Home health/PT,  Genevieve Norlander.   DIET:  No restrictions.   WOUND:  The patient is to keep wound clean and dry.  The patient is to  change dressing daily.  May shower after two days of no drainage.   SPECIAL INSTRUCTIONS:  CPM to be used 6-8 hours a day, increase by 10  degrees per day.   FOLLOWUP:  The patient needs to follow up with Dr. Sherlean Foot in approximately 14  days from date of surgery.  The patient is to call office at [phone] to  make an appointment.      GC/MEDQ  D:  10/25/2004  T:  10/25/2004  Job:  [id number]   cc:   Mila Homer. Sherlean Foot, M.D.  [address] [place]  [place] [address]  Fax: [phone]

## 2010-11-02 NOTE — Op Note (Signed)
NAME:  Kelsey [name], Kelsey [name]                       ACCOUNT NO.:  [id number]   MEDICAL RECORD NO.:  [id number]                   PATIENT TYPE:  INP   LOCATION:  2550                                 FACILITY:  [facility]   PHYSICIAN:  Thera Flake., M.D.             DATE OF BIRTH:  [birth year]   DATE OF PROCEDURE:  03/30/2003  DATE OF DISCHARGE:                                 OPERATIVE REPORT   PREOPERATIVE DIAGNOSIS:  Osteoarthritis of left knee.   POSTOPERATIVE DIAGNOSIS:  Osteoarthritis of left knee.   OPERATION:  Left total knee replacement (LCS rotating platform with standard-  plus femur and patella, 2.5-size tibia and 20-mm bearings).   SURGEON:  Dyke Brackett, M.D.   ASSISTANT:  Madilyn Fireman, P.A.-C.   TOURNIQUET TIME:  One hour and 40 minutes.   DESCRIPTION OF PROCEDURE:  Straight skin incision with a medial parapatellar  approach to the knee made, stripping of the medial side due to the varus  deformity after cutting the tibia about 2 mm below the most diseased medial  compartment.  Despite a very bone-conserving cut, there appeared to be  insufficiency after slight stripping of the collateral and this led to the  bearing thickness going up to 20, however, excellent stability was obtained  at the conclusion of the case.  The tibial cut was accomplished using the  guide with the appropriate posterior slope followed by sizing the femur.  Femoral osteophytes were removed.  The anteroposterior femoral cut was made  initially with a 12.5-mm bearing but due to issues of ligamentous balance  and stripping, it eventually sized up to 20, distal femoral cut with a 4-  degree valgus cut.  Flexion and extension gap was equalized.  Tibia was cut  with a keel designed for the tibia after posterior osteophytes were removed  and the chamfer cuts were made on the femur as well.  After placement of the  trial components, it was noted that the posterolateral corner was extremely  tight,  leading to some bearing spin-out.  For this reason, as the bearing  thickness was sized up to maintain stability on the medial side, this  required stripping of the lateral side with release of the iliotibial band,  the popliteus partially; again, this balanced the medial and lateral sides  nicely; there was no tendency to bearing spin-out at the conclusion of the  stripping.  Tourniquet was released prior to final closure and actually  prior to putting the final bearing in and there was no excessive bleeding in  the posterior or posterolateral aspects of the knee note.   Patella was cut, leaving about 14 mm of native patella, with a three-peg  patella rotating platform-type design.  Trial was next placed.  All the  components were next cemented in after copious irrigation of the bony  surfaces, trial reduction carried out in terms of a trial reduction with a  trial bearing, followed placement of the 20-mm bearing, full extension  obtained, only slight, probably 2 to 3 mm of opening to valgus stress  testing, no tendency to bearing spin-out with the towel clips on the  extensor mechanism.  Closure was effected with #1 Ethibond and stability was  again checked.  Hemovac drain was placed deep to the capsule, 2-0 and 0  Vicryl and skin clips, Marcaine with epinephrine of the skin and then I  believe a femoral nerve block was to be performed after that time.                                              Thera Flake., M.D.   WDC/MEDQ  D:  03/30/2003  T:  03/30/2003  Job:  [id number]

## 2010-12-10 ENCOUNTER — Encounter: Payer: Self-pay | Admitting: Internal Medicine

## 2011-01-07 ENCOUNTER — Encounter: Payer: Self-pay | Admitting: Internal Medicine

## 2011-01-11 ENCOUNTER — Ambulatory Visit (INDEPENDENT_AMBULATORY_CARE_PROVIDER_SITE_OTHER): Payer: BC Managed Care – PPO | Admitting: Internal Medicine

## 2011-01-11 ENCOUNTER — Other Ambulatory Visit: Payer: Self-pay | Admitting: Internal Medicine

## 2011-01-11 ENCOUNTER — Encounter: Payer: Self-pay | Admitting: Internal Medicine

## 2011-01-11 DIAGNOSIS — R0602 Shortness of breath: Secondary | ICD-10-CM

## 2011-01-11 DIAGNOSIS — E785 Hyperlipidemia, unspecified: Secondary | ICD-10-CM

## 2011-01-11 DIAGNOSIS — I119 Hypertensive heart disease without heart failure: Secondary | ICD-10-CM

## 2011-01-11 DIAGNOSIS — R079 Chest pain, unspecified: Secondary | ICD-10-CM

## 2011-01-11 DIAGNOSIS — I10 Essential (primary) hypertension: Secondary | ICD-10-CM

## 2011-01-11 DIAGNOSIS — K219 Gastro-esophageal reflux disease without esophagitis: Secondary | ICD-10-CM

## 2011-01-11 MED ORDER — RANITIDINE HCL 150 MG PO TABS: 150.0000 mg | ORAL_TABLET | Freq: Two times a day (BID) | ORAL | Status: DC

## 2011-01-11 NOTE — Progress Notes (Signed)
HPI Patient is a 65 year old with a history of chest pain. Negative myoview. 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 (peak grad 34 mm). She also has a history of hypertension. I saw her in clinic in September. Since seen her breathing has been steady.  She notes some indigestion but denies other chest pains. Biggest compliant is problems with her R knee which slipped.  She has had surgery in past.  Dr. Despina Hick wants her to lose wt before he operates again. She was seen at Dr. Jearl Klinefelter clinic.  Considering HCG for wt loss.  Allergies not on file  Current Outpatient Prescriptions  Medication Sig Dispense Refill  . Calcium Citrate (CAL-CITRATE PO) Take by mouth.        . meloxicam (MOBIC) 15 MG tablet Take 15 mg by mouth daily.        Marland Kitchen olmesartan-hydrochlorothiazide (BENICAR HCT) 40-25 MG per tablet Take 1 tablet by mouth daily.        Marland Kitchen aspirin 81 MG EC tablet Take 81 mg by mouth daily.        Marland Kitchen atorvastatin (LIPITOR) 40 MG tablet Take 40 mg by mouth daily.        . ergocalciferol (VITAMIN D2) 50000 UNITS capsule Take 50,000 Units by mouth 2 (two) times a week.        . metoprolol (TOPROL-XL) 50 MG 24 hr tablet Take 75 mg by mouth daily.        . prednisoLONE sodium phosphate (INFLAMASE FORTE) 1 % ophthalmic solution 1 drop 4 (four) times daily. UAD       . risedronate (ACTONEL) 35 MG tablet Take 35 mg by mouth every 7 (seven) days. with water on empty stomach, nothing by mouth or lie down for next 30 minutes.         Past Medical History  Diagnosis Date  . Obesity   . HTN (hypertension)   . HLD (hyperlipidemia)   . DJD (degenerative joint disease)     Past Surgical History  Procedure Date  . Bilateral knee replacements   . Vesicovaginal fistula closure w/ tah   . Corneal transplant     No family history on file.  History   Social History  . Marital Status: Legally Separated    Spouse  Name: N/A    Number of Children: N/A  . Years of Education: N/A   Occupational History  . Not on file.   Social History Main Topics  . Smoking status: Current Everyday Smoker  . Smokeless tobacco: Not on file   Comment: 1/2 ppd   . Alcohol Use: Yes     glass of wine   . Drug Use: No  . Sexually Active: Not on file   Other Topics Concern  . 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     Review of Systems:  All systems reviewed.  They are negative to the above problem except as previously stated.  Vital Signs: BP 140/78  Pulse 64  Ht 5\' 5"  (1.651 m)  Wt 226 lb (102.513 kg)  BMI 37.61 kg/m2  Physical Exam  Patient is in NAD  HEENT:  Normocephalic, atraumatic. EOMI, PERRLA.  Neck: JVP is normal. No thyromegaly. No bruits.  Lungs: clear to auscultation. No rales no wheezes.  Heart: Regular rate and rhythm. Normal S1, S2. No S3.  Gr II/VI systolic murmur.Marland Kitchen PMI not displaced.  Abdomen:  Supple, nontender. Normal bowel sounds. No masses. No hepatomegaly.  Extremities:   Good distal pulses throughout. No lower extremity edema.  Musculoskeletal :moving all extremities.  Neuro:   alert and oriented x3.  CN II-XII grossly intact.  EKG:  NSR.  64 bpm.  Assessment and Plan:

## 2011-01-11 NOTE — Patient Instructions (Signed)
Your physician wants you to follow-up in: Feb 2013 with Dr.Ross You will receive a reminder letter in the mail two months in advance. If you don't receive a letter, please call our office to schedule the follow-up appointment.

## 2011-01-11 NOTE — Telephone Encounter (Signed)
Pt seen today and rx for acid reflux was to be called in at [facility], not there yet

## 2011-01-11 NOTE — Telephone Encounter (Signed)
Advised Zantac 150mg  called to [facility] [address].

## 2011-01-14 NOTE — Assessment & Plan Note (Signed)
Adequate control.  Keep on current regiman.

## 2011-01-14 NOTE — Assessment & Plan Note (Signed)
Patient denies significant symptoms.  I will review HCG with pharmacy.  Contact (patient seen by Dr. Mayford Knife (fax:  [phone]) From are cardiac standpoint shhe is at low risk for cardiac event if she has orthopedic procedure.

## 2011-01-14 NOTE — Assessment & Plan Note (Signed)
Denies significant CP.  Does note some reflux.  Will call in Rx for Zantac.

## 2011-01-14 NOTE — Assessment & Plan Note (Addendum)
Continue current regimen

## 2011-01-21 ENCOUNTER — Telehealth: Payer: Self-pay | Admitting: Internal Medicine

## 2011-01-21 NOTE — Telephone Encounter (Signed)
Pt gave some info to Dr. Tenny Craw at last visit about medication to be completed and patient was to get the info prior to Dr. Tenny Craw going on vacation and patient still has not got information and she is calling to f/u on it

## 2011-01-21 NOTE — Telephone Encounter (Signed)
Called patient and advised that Weston Brass PharmD provided and article from the [employer] that the use of HCG for weight loss is not recommended. I advised her that she could look into Weight Watchers or [facility] Diet. Advise Dr.Ross would call her if she had anything more to add.

## 2011-03-29 LAB — URINE CULTURE

## 2011-03-29 LAB — URINALYSIS, ROUTINE W REFLEX MICROSCOPIC
Bilirubin Urine: NEGATIVE
Glucose, UA: NEGATIVE
Hgb urine dipstick: NEGATIVE
Ketones, ur: NEGATIVE
pH: 6

## 2011-03-29 LAB — BASIC METABOLIC PANEL
BUN: 16
Calcium: 9.1
Creatinine, Ser: 0.66
GFR calc Af Amer: >60
GFR calc non Af Amer: >60

## 2011-03-29 LAB — URINE MICROSCOPIC-ADD ON

## 2011-04-09 ENCOUNTER — Emergency Department (HOSPITAL_COMMUNITY)
Admission: EM | Admit: 2011-04-09 | Discharge: 2011-04-09 | Disposition: A | Discharge status: Home / Self Care | Payer: BC Managed Care – PPO | Attending: Emergency Medicine | Admitting: Emergency Medicine

## 2011-04-09 DIAGNOSIS — I10 Essential (primary) hypertension: Secondary | ICD-10-CM | POA: Insufficient documentation

## 2011-04-09 DIAGNOSIS — Z79899 Other long term (current) drug therapy: Secondary | ICD-10-CM | POA: Insufficient documentation

## 2011-04-09 DIAGNOSIS — E785 Hyperlipidemia, unspecified: Secondary | ICD-10-CM | POA: Insufficient documentation

## 2011-07-15 ENCOUNTER — Ambulatory Visit: Payer: BC Managed Care – PPO | Admitting: Internal Medicine

## 2011-07-29 ENCOUNTER — Ambulatory Visit (INDEPENDENT_AMBULATORY_CARE_PROVIDER_SITE_OTHER): Payer: BC Managed Care – PPO | Admitting: Internal Medicine

## 2011-07-29 ENCOUNTER — Encounter: Payer: Self-pay | Admitting: Internal Medicine

## 2011-07-29 DIAGNOSIS — R079 Chest pain, unspecified: Secondary | ICD-10-CM

## 2011-07-29 DIAGNOSIS — E785 Hyperlipidemia, unspecified: Secondary | ICD-10-CM

## 2011-07-29 DIAGNOSIS — R011 Cardiac murmur, unspecified: Secondary | ICD-10-CM

## 2011-07-29 DIAGNOSIS — I10 Essential (primary) hypertension: Secondary | ICD-10-CM

## 2011-07-29 DIAGNOSIS — R0602 Shortness of breath: Secondary | ICD-10-CM

## 2011-07-29 NOTE — Assessment & Plan Note (Signed)
Continue statin. 

## 2011-07-29 NOTE — Assessment & Plan Note (Signed)
Patient says she is in a BP study at [facility].  BP is usually better.  Will need to follow.

## 2011-07-29 NOTE — Progress Notes (Signed)
Patient ID: Kelsey [name], female   DOB: Oct 11, 1945, 66 y.o.   MRN: [id number] HPIPatient is a 66 year old with a history of chest pain. Negative myoview. 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 (peak grad 34 mm). She also has a history of hypertension. I saw her in clinic in July 2012. Now in a BP study in [facility]  Seen 1x per month.  BP 140s. Breathing has been OK.  Occasional CP over last few wks. Not associated with activity.     No Known Allergies  Current Outpatient Prescriptions  Medication Sig Dispense Refill  . aspirin 81 MG EC tablet Take 81 mg by mouth daily.        Marland Kitchen atorvastatin (LIPITOR) 20 MG tablet Take 20 mg by mouth daily.        . Calcium Citrate (CAL-CITRATE PO) Take by mouth.        . chlorthalidone (HYGROTON) 25 MG tablet Take 1/2 tab daily      . cyanocobalamin 1000 MCG tablet Take 100 mcg by mouth daily.        . ergocalciferol (VITAMIN D2) 50000 UNITS capsule Take 50,000 Units by mouth 2 (two) times a week.        . fish oil-omega-3 fatty acids 1000 MG capsule Take 2 g by mouth daily.      . metoprolol (LOPRESSOR) 50 MG tablet Take 50 mg by mouth 2 (two) times daily.      . naproxen sodium (ANAPROX) 220 MG tablet Take 220 mg by mouth 2 (two) times daily with a meal.        . prednisoLONE sodium phosphate (INFLAMASE FORTE) 1 % ophthalmic solution 1 drop 4 (four) times daily. UAD         Past Medical History  Diagnosis Date  . Obesity   . HTN (hypertension)   . HLD (hyperlipidemia)   . DJD (degenerative joint disease)     Past Surgical History  Procedure Date  . Bilateral knee replacements   . Vesicovaginal fistula closure w/ tah   . Corneal transplant     No family history on file.  History   Social History  . Marital Status: Legally Separated    Spouse Name: N/A    Number of Children: N/A  . Years of Education: N/A   Occupational History  . Not  on file.   Social History Main Topics  . Smoking status: Current Everyday Smoker  . Smokeless tobacco: Not on file   Comment: 1/2 ppd   . Alcohol Use: Yes     glass of wine   . Drug Use: No  . Sexually Active: Not on file   Other Topics Concern  . 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     Review of Systems:  All systems reviewed.  They are negative to the above problem except as previously stated.  Vital Signs: BP 177/84  Pulse 62  Ht 5\' 5"  (1.651 m)  Wt 236 lb (107.049 kg)  BMI 39.27 kg/m2  Physical Exam  HEENT:  Normocephalic, atraumatic. EOMI, PERRLA.  Neck: JVP is normal. No thyromegaly. No bruits.  Lungs: clear to auscultation. No rales no wheezes.  Heart: Regular rate and rhythm. Normal S1, S2. No S3.   Gr II/VI systolic murmur LSB.Marland Kitchen PMI not displaced.  Abdomen:  Supple, nontender.  Normal bowel sounds. No masses. No hepatomegaly.  Extremities:   Good distal pulses throughout. No lower extremity edema.  Musculoskeletal :moving all extremities.  Neuro:   alert and oriented x3.  CN II-XII grossly intact.  EKG:  SR  62 bpm.   Assessment and Plan:

## 2011-07-29 NOTE — Assessment & Plan Note (Signed)
Denies signif dyspnea.

## 2011-07-29 NOTE — Patient Instructions (Signed)
Your physician has requested that you have an echocardiogram. Echocardiography is a painless test that uses sound waves to create images of your heart. It provides your doctor with information about the size and shape of your heart and how well your heart's chambers and valves are working. This procedure takes approximately one hour. There are no restrictions for this procedure.  Your physician wants you to follow-up in: December 2013You will receive a reminder letter in the mail two months in advance. If you don't receive a letter, please call our office to schedule the follow-up appointment.

## 2011-07-29 NOTE — Assessment & Plan Note (Signed)
Occasional atypical spells  I am not convinced it represents angina. Will get echo to reevaluate LVOT.

## 2011-08-06 NOTE — Progress Notes (Signed)
Addended by: Judithe Modest D on: 08/06/2011 05:38 PM   Modules accepted: Orders

## 2011-08-15 ENCOUNTER — Ambulatory Visit (HOSPITAL_COMMUNITY): Payer: BC Managed Care – PPO

## 2011-08-28 ENCOUNTER — Ambulatory Visit (HOSPITAL_COMMUNITY): Payer: BC Managed Care – PPO | Attending: Cardiology

## 2011-08-28 ENCOUNTER — Other Ambulatory Visit: Payer: Self-pay

## 2011-08-28 DIAGNOSIS — I10 Essential (primary) hypertension: Secondary | ICD-10-CM | POA: Insufficient documentation

## 2011-08-28 DIAGNOSIS — I059 Rheumatic mitral valve disease, unspecified: Secondary | ICD-10-CM | POA: Insufficient documentation

## 2011-08-28 DIAGNOSIS — E785 Hyperlipidemia, unspecified: Secondary | ICD-10-CM | POA: Insufficient documentation

## 2011-08-28 DIAGNOSIS — I079 Rheumatic tricuspid valve disease, unspecified: Secondary | ICD-10-CM | POA: Insufficient documentation

## 2011-08-28 DIAGNOSIS — E669 Obesity, unspecified: Secondary | ICD-10-CM | POA: Insufficient documentation

## 2011-08-28 DIAGNOSIS — R011 Cardiac murmur, unspecified: Secondary | ICD-10-CM

## 2011-08-28 DIAGNOSIS — F17200 Nicotine dependence, unspecified, uncomplicated: Secondary | ICD-10-CM | POA: Insufficient documentation

## 2011-08-28 DIAGNOSIS — R072 Precordial pain: Secondary | ICD-10-CM | POA: Insufficient documentation

## 2011-10-28 ENCOUNTER — Other Ambulatory Visit: Payer: Self-pay | Admitting: Internal Medicine

## 2011-10-28 DIAGNOSIS — R748 Abnormal levels of other serum enzymes: Secondary | ICD-10-CM

## 2011-11-08 ENCOUNTER — Ambulatory Visit
Admission: RE | Admit: 2011-11-08 | Discharge: 2011-11-08 | Disposition: A | Discharge status: Home / Self Care | Payer: BC Managed Care – PPO | Source: Ambulatory Visit | Attending: Internal Medicine | Admitting: Internal Medicine

## 2011-11-08 DIAGNOSIS — R748 Abnormal levels of other serum enzymes: Secondary | ICD-10-CM

## 2011-12-17 ENCOUNTER — Encounter: Payer: Self-pay | Admitting: Internal Medicine

## 2012-03-31 ENCOUNTER — Observation Stay (HOSPITAL_COMMUNITY)
Admission: EM | Admit: 2012-03-31 | Discharge: 2012-04-01 | Disposition: A | Discharge status: Home / Self Care | Payer: BC Managed Care – PPO | Attending: Cardiovascular Disease | Admitting: Cardiovascular Disease

## 2012-03-31 ENCOUNTER — Encounter (HOSPITAL_COMMUNITY): Payer: Self-pay | Admitting: *Deleted

## 2012-03-31 ENCOUNTER — Emergency Department (HOSPITAL_COMMUNITY): Payer: BC Managed Care – PPO

## 2012-03-31 DIAGNOSIS — E669 Obesity, unspecified: Secondary | ICD-10-CM | POA: Insufficient documentation

## 2012-03-31 DIAGNOSIS — Z7982 Long term (current) use of aspirin: Secondary | ICD-10-CM | POA: Insufficient documentation

## 2012-03-31 DIAGNOSIS — F17200 Nicotine dependence, unspecified, uncomplicated: Secondary | ICD-10-CM | POA: Insufficient documentation

## 2012-03-31 DIAGNOSIS — I200 Unstable angina: Secondary | ICD-10-CM

## 2012-03-31 DIAGNOSIS — I10 Essential (primary) hypertension: Secondary | ICD-10-CM | POA: Diagnosis present

## 2012-03-31 DIAGNOSIS — M1990 Unspecified osteoarthritis, unspecified site: Secondary | ICD-10-CM | POA: Insufficient documentation

## 2012-03-31 DIAGNOSIS — Z79899 Other long term (current) drug therapy: Secondary | ICD-10-CM | POA: Insufficient documentation

## 2012-03-31 DIAGNOSIS — R0789 Other chest pain: Secondary | ICD-10-CM

## 2012-03-31 DIAGNOSIS — E785 Hyperlipidemia, unspecified: Secondary | ICD-10-CM | POA: Diagnosis present

## 2012-03-31 DIAGNOSIS — Z720 Tobacco use: Secondary | ICD-10-CM

## 2012-03-31 DIAGNOSIS — R079 Chest pain, unspecified: Principal | ICD-10-CM | POA: Insufficient documentation

## 2012-03-31 DIAGNOSIS — I5032 Chronic diastolic (congestive) heart failure: Secondary | ICD-10-CM | POA: Insufficient documentation

## 2012-03-31 HISTORY — DX: Cardiac murmur, unspecified: R01.1

## 2012-03-31 HISTORY — PX: CARDIOVASCULAR STRESS TEST: SHX262

## 2012-03-31 LAB — CBC WITH DIFFERENTIAL/PLATELET
Basophils Absolute: 0 K/uL (ref 0.0–0.1)
Basophils Relative: 0 % (ref 0–1)
Eosinophils Absolute: 0.2 K/uL (ref 0.0–0.7)
Eosinophils Relative: 3 % (ref 0–5)
HCT: 45.8 % (ref 36.0–46.0)
Hemoglobin: 15 g/dL (ref 12.0–15.0)
Lymphocytes Relative: 28 % (ref 12–46)
Lymphs Abs: 1.9 K/uL (ref 0.7–4.0)
MCH: 27.9 pg (ref 26.0–34.0)
MCHC: 32.8 g/dL (ref 30.0–36.0)
MCV: 85.3 fL (ref 78.0–100.0)
Monocytes Absolute: 0.5 K/uL (ref 0.1–1.0)
Monocytes Relative: 7 % (ref 3–12)
Neutro Abs: 4.3 K/uL (ref 1.7–7.7)
Neutrophils Relative %: 62 % (ref 43–77)
Platelets: 261 K/uL (ref 150–400)
RBC: 5.37 MIL/uL — ABNORMAL HIGH (ref 3.87–5.11)
RDW: 18.4 % — ABNORMAL HIGH (ref 11.5–15.5)
WBC: 6.9 K/uL (ref 4.0–10.5)

## 2012-03-31 LAB — BASIC METABOLIC PANEL
BUN: 15 mg/dL (ref 6–23)
Creatinine, Ser: 0.6 mg/dL (ref 0.50–1.10)
GFR calc non Af Amer: >90 mL/min (ref >90)
Glucose, Bld: 82 mg/dL (ref 70–99)
Potassium: 3.6 mEq/L (ref 3.5–5.1)

## 2012-03-31 MED ORDER — ATORVASTATIN CALCIUM 20 MG PO TABS
20.0000 mg | ORAL_TABLET | Freq: Every day | ORAL | Status: DC
  Administered 2012-04-01: 20 mg via ORAL
  Filled 2012-03-31: qty 1

## 2012-03-31 MED ORDER — ASPIRIN EC 81 MG PO TBEC
81.0000 mg | DELAYED_RELEASE_TABLET | Freq: Every day | ORAL | Status: DC
  Administered 2012-04-01: 81 mg via ORAL
  Filled 2012-03-31: qty 1

## 2012-03-31 MED ORDER — NAPROXEN SODIUM 275 MG PO TABS
220.0000 mg | ORAL_TABLET | Freq: Two times a day (BID) | ORAL | Status: DC
  Administered 2012-04-01: 275 mg via ORAL
  Filled 2012-03-31 (×3): qty 1

## 2012-03-31 MED ORDER — NITROGLYCERIN 0.4 MG SL SUBL
0.4000 mg | SUBLINGUAL_TABLET | SUBLINGUAL | Status: DC | PRN
  Administered 2012-03-31: 0.4 mg via SUBLINGUAL
  Filled 2012-03-31: qty 25

## 2012-03-31 MED ORDER — SODIUM CHLORIDE 0.9 % IV SOLN: 250.0000 mL | INTRAVENOUS | Status: DC | PRN

## 2012-03-31 MED ORDER — ASPIRIN 300 MG RE SUPP
300.0000 mg | RECTAL | Status: DC
  Filled 2012-03-31: qty 1

## 2012-03-31 MED ORDER — METOPROLOL TARTRATE 50 MG PO TABS
50.0000 mg | ORAL_TABLET | Freq: Two times a day (BID) | ORAL | Status: DC
  Administered 2012-03-31 – 2012-04-01 (×2): 50 mg via ORAL
  Filled 2012-03-31 (×3): qty 1

## 2012-03-31 MED ORDER — ASPIRIN 81 MG PO CHEW
243.0000 mg | CHEWABLE_TABLET | Freq: Once | ORAL | Status: AC
  Administered 2012-03-31: 243 mg via ORAL
  Filled 2012-03-31: qty 4

## 2012-03-31 MED ORDER — AMLODIPINE BESYLATE 5 MG PO TABS
5.0000 mg | ORAL_TABLET | Freq: Every day | ORAL | Status: DC
  Administered 2012-03-31 – 2012-04-01 (×2): 5 mg via ORAL
  Filled 2012-03-31 (×2): qty 1

## 2012-03-31 MED ORDER — SODIUM CHLORIDE 0.9 % IJ SOLN
3.0000 mL | Freq: Two times a day (BID) | INTRAMUSCULAR | Status: DC
  Administered 2012-03-31 – 2012-04-01 (×2): 3 mL via INTRAVENOUS

## 2012-03-31 MED ORDER — GI COCKTAIL ~~LOC~~
30.0000 mL | Freq: Once | ORAL | Status: AC
  Administered 2012-03-31: 30 mL via ORAL
  Filled 2012-03-31: qty 30

## 2012-03-31 MED ORDER — ACETAMINOPHEN 325 MG PO TABS: 650.0000 mg | ORAL_TABLET | ORAL | Status: DC | PRN

## 2012-03-31 MED ORDER — AMLODIPINE BESYLATE 2.5 MG PO TABS
2.5000 mg | ORAL_TABLET | Freq: Every day | ORAL | Status: DC
  Filled 2012-03-31 (×3): qty 1

## 2012-03-31 MED ORDER — ONDANSETRON HCL 4 MG/2ML IJ SOLN: 4.0000 mg | Freq: Four times a day (QID) | INTRAMUSCULAR | Status: DC | PRN

## 2012-03-31 MED ORDER — SODIUM CHLORIDE 0.9 % IJ SOLN: 3.0000 mL | INTRAMUSCULAR | Status: DC | PRN

## 2012-03-31 MED ORDER — ASPIRIN EC 81 MG PO TBEC: 81.0000 mg | DELAYED_RELEASE_TABLET | Freq: Every day | ORAL | Status: DC

## 2012-03-31 MED ORDER — ZOLPIDEM TARTRATE 5 MG PO TABS: 5.0000 mg | ORAL_TABLET | Freq: Every evening | ORAL | Status: DC | PRN

## 2012-03-31 MED ORDER — PREDNISOLONE SODIUM PHOSPHATE 1 % OP SOLN
1.0000 [drp] | Freq: Two times a day (BID) | OPHTHALMIC | Status: DC
  Administered 2012-03-31 – 2012-04-01 (×2): 1 [drp] via OPHTHALMIC
  Filled 2012-03-31 (×2): qty 10

## 2012-03-31 MED ORDER — ASPIRIN 81 MG PO CHEW: 324.0000 mg | CHEWABLE_TABLET | ORAL | Status: DC

## 2012-03-31 MED ORDER — REGADENOSON 0.4 MG/5ML IV SOLN
0.4000 mg | Freq: Once | INTRAVENOUS | Status: AC
  Administered 2012-04-01: 0.4 mg via INTRAVENOUS
  Filled 2012-03-31: qty 5

## 2012-03-31 NOTE — ED Notes (Signed)
Report given to Courtney, RN on 4700.  

## 2012-03-31 NOTE — ED Provider Notes (Addendum)
History     CSN: [id number]  Arrival date & time 03/31/12  1056   First MD Initiated Contact with Patient 03/31/12 1123      No chief complaint on file.   (Consider location/radiation/quality/duration/timing/severity/associated sxs/prior treatment) HPI  Patient presents to the emergency department for evaluation of epigastric pain. She states that it has been going on for a couple of days and not getting better. The patient has a past medical history of high blood pressure, hypercholesterolemia, and heart murmur. She says that when she exerts herself her symptoms get worse and are accompanied by diaphoresis, shortness of breath, leg swelling and fatigue. She says that when she slows down all of her symptoms to resolve. She denies having chest pains or shortness of breath at rest. Patient had a 2-D echo done this year and it showed that she has [facility]. Bowel movements have been regular, no dysuria, no vaginal discharge. Injury she patient is hypertensive  Past Medical History  Diagnosis Date  . Obesity   . HTN (hypertension)   . HLD (hyperlipidemia)   . DJD (degenerative joint disease)     Past Surgical History  Procedure Date  . Bilateral knee replacements   . Vesicovaginal fistula closure w/ tah   . Corneal transplant     No family history on file.  History  Substance Use Topics  . Smoking status: Current Every Day Smoker  . Smokeless tobacco: Not on file   Comment: 1/2 ppd   . Alcohol Use: Yes     glass of wine     OB History    Grav Para Term Preterm Abortions TAB SAB Ect Mult Living                  Review of Systems  Review of Systems  Gen: no weight loss, fevers, chills, night sweats  Eyes: no discharge or drainage, no occular pain or visual changes  Nose: no epistaxis or rhinorrhea  Mouth: no dental pain, no sore throat  Neck: no neck pain  Lungs:No wheezing, coughing or hemoptysis CV: no chest pain, palpitations, dependent edema or orthopnea  Abd: +  epigastric pain, nausea, vomiting  GU: no dysuria or gross hematuria  MSK:  No abnormalities  Neuro: no headache, no focal neurologic deficits  Skin: no abnormalities Psyche: negative.   Allergies  Beeswax  Home Medications   Current Outpatient Rx  Name Route Sig Dispense Refill  . AMLODIPINE BESYLATE 2.5 MG PO TABS Oral Take 2.5 mg by mouth daily.    . ASPIRIN 81 MG PO TBEC Oral Take 81 mg by mouth daily.      . ATORVASTATIN CALCIUM 20 MG PO TABS Oral Take 20 mg by mouth daily.      Marland Kitchen CAL-CITRATE PO Oral Take by mouth.      . CYANOCOBALAMIN 1000 MCG PO TABS Oral Take 1,000 mcg by mouth daily.     Marland Kitchen EPINEPHRINE 0.15 MG/0.3ML IJ DEVI Intramuscular Inject 0.15 mg into the muscle as needed.    . ERGOCALCIFEROL 50000 UNITS PO CAPS Oral Take 50,000 Units by mouth 2 (two) times a week. Monday and Thursday    . OMEGA-3 FATTY ACIDS 1000 MG PO CAPS Oral Take 2 g by mouth daily.    Marland Kitchen METOPROLOL TARTRATE 50 MG PO TABS Oral Take 50 mg by mouth 2 (two) times daily.    Marland Kitchen NAPROXEN SODIUM 220 MG PO TABS Oral Take 220 mg by mouth 2 (two) times daily with a meal.      .  PREDNISOLONE SODIUM PHOSPHATE 1 % OP SOLN Left Eye Place 1 drop into the left eye 2 (two) times daily. UAD      BP 182/94  Pulse 66  Temp 97.9 F (36.6 C) (Oral)  Resp 16  SpO2 99%  Physical Exam  Nursing note and vitals reviewed. Constitutional: She appears well-developed and well-nourished. No distress.  HENT:  Head: Normocephalic and atraumatic.  Eyes: Pupils are equal, round, and reactive to light.  Neck: Normal range of motion. Neck supple.  Cardiovascular: Normal rate and regular rhythm.   Pulmonary/Chest: Effort normal. No respiratory distress. She has no wheezes. She has no rales.  Abdominal: Soft. She exhibits no distension. There is tenderness (epigastric). There is no rebound and no guarding.  Neurological: She is alert.  Skin: Skin is warm and dry.    ED Course  Procedures (including critical care  time)  Labs Reviewed  CBC WITH DIFFERENTIAL - Abnormal; Notable for the following:    RBC 5.37 (*)     RDW 18.4 (*)     All other components within normal limits  PRO B NATRIURETIC PEPTIDE  BASIC METABOLIC PANEL  TROPONIN I   Dg Chest 2 View  03/31/2012  *RADIOLOGY REPORT*  Clinical Data: Epigastric pain  CHEST - 2 VIEW  Comparison: 07/20/2009  Findings: Mild aortic tortuosity.  Heart size within normal limits. Hypoaeration with interstitial and vascular crowding.  Elevated hemidiaphragms. Linear lung base opacities are likely atelectasis or scarring.  No confluent airspace opacity.  No pleural effusion or pneumothorax.  Multilevel degenerative changes.  No acute osseous finding.  IMPRESSION: Hypoaeration with interstitial and vascular crowding.   Original Report Authenticated By: Waneta Martins, M.D.      1. Unstable angina       MDM   Date: 03/31/2012  Rate:61  Rhythm: normal sinus rhythm  QRS Axis: normal  Intervals: normal  ST/T Wave abnormalities: normal  Conduction Disutrbances:none and probably left atrial abnormality and probable LVH  Narrative Interpretation:   Old EKG Reviewed: unchanged from Jul 22, 2009   3:50pm- [facility] has agreed to come see patient in the ED  5:16pm- Dr. Excell Seltzer has seen patient. He is going to admit and transfer to [place] for her to have a Stress Echo in the morning. Dr. Excell Seltzer to do admission and transfer.       Dorthula Matas, PA 03/31/12 1717  Dorthula Matas, PA 04/19/12 [phone]

## 2012-03-31 NOTE — ED Notes (Signed)
[facility] called to transport pt to [place]. 

## 2012-03-31 NOTE — ED Notes (Signed)
Pt tolerating PO challenge. Pt has drank 8oz ginger ale with no difficulty.

## 2012-03-31 NOTE — H&P (Signed)
Patient ID: Kelsey [name] MRN: [id number] DOB/AGE: 1945/12/18 66 y.o. Admit date: 03/31/2012  Primary Care Physician:PANG,RICHARD, MD Primary Cardiologist: Dietrich Pates, MD Active Problems:  CHEST PAIN-UNSPECIFIED  HPI: 66 year-old woman presenting with a multitude of symptoms. These include substernal chest discomfort described as 'indigestion and heartburn.' She also has diaphoresis, tingling of her arms, leg weakness, and headache/dizziness. Symptoms have occurred over 3-4 days and seem to improve after she takes BP meds. Not relieved by anything else. She earlier reported and exertional component to her chest discomfort. Has had heartburn in the past but this feels 'deeper' in the chest. No dyspnea, edema, orthopnea, or PND. She has been compliant with her medications and reports no other symptoms.  Past Medical History  Diagnosis Date  . Obesity   . HTN (hypertension)   . HLD (hyperlipidemia)   . DJD (degenerative joint disease)     Past Surgical History  Procedure Date  . Bilateral knee replacements   . Vesicovaginal fistula closure w/ tah   . Corneal transplant     Family History  Problem Relation Age of Onset  . Hypertension Father   . Hypertension Brother     History   Social History  . Marital Status: Legally Separated    Spouse Name: N/A    Number of Children: N/A  . Years of Education: N/A   Occupational History  . Not on file.   Social History Main Topics  . Smoking status: Current Every Day Smoker  . Smokeless tobacco: Not on file   Comment: 1/2 ppd   . Alcohol Use: Yes     glass of wine   . Drug Use: No  . Sexually Active: Not on file   Other Topics Concern  . 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 Occasional smoking and Etoh.    Meds:  .  AMLODIPINE BESYLATE 2.5 MG PO TABS  Oral  Take 2.5 mg by mouth daily.  .  ASPIRIN 81 MG PO TBEC  Oral    Take 81 mg by mouth daily.  .  ATORVASTATIN CALCIUM 20 MG PO TABS  Oral  Take 20 mg by mouth daily.  Marland Kitchen  CAL-CITRATE PO  Oral  Take by mouth.  .  CYANOCOBALAMIN 1000 MCG PO TABS  Oral  Take 1,000 mcg by mouth daily.  Marland Kitchen  EPINEPHRINE 0.15 MG/0.3ML IJ DEVI  Intramuscular  Inject 0.15 mg into the muscle as needed.  .  ERGOCALCIFEROL 50000 UNITS PO CAPS  Oral  Take 50,000 Units by mouth 2 (two) times a week. Monday and Thursday  .  OMEGA-3 FATTY ACIDS 1000 MG PO CAPS  Oral  Take 2 g by mouth daily.  Marland Kitchen  METOPROLOL TARTRATE 50 MG PO TABS  Oral  Take 50 mg by mouth 2 (two) times daily.  Marland Kitchen  NAPROXEN SODIUM 220 MG PO TABS  Oral  Take 220 mg by mouth 2 (two) times daily with a meal.  .  PREDNISOLONE SODIUM PHOSPHATE 1 % OP SOLN   ROS:  General: no fevers/chills/night sweats/weight loss Eyes: no blurry vision, diplopia, or amaurosis ENT: no sore throat or hearing loss Resp: no cough, wheezing, or hemoptysis CV: no edema or palpitations GI: no abdominal pain, nausea, vomiting, diarrhea, or constipation GU: no dysuria, frequency, or hematuria Skin: no rash Neuro: positive for headache, numbness and tingling in arms Musculoskeletal: no joint pain  or swelling Heme: no bleeding, DVT, or easy bruising Endo: no polydipsia or polyuria  Physical Exam: Blood pressure 185/87, pulse 67, temperature 97.9 F (36.6 C), temperature source Oral, resp. rate 21, SpO2 97.00%.    Pt is alert and oriented, WD, WN, in no distress. HEENT: normal Neck: JVP normal. Carotid upstrokes normal without bruits. No thyromegaly. Lungs: equal expansion, clear bilaterally CV: Apex is discrete and nondisplaced, RRR with grade 2/6 systolic murmur at the LSB Abd: soft, NT, +BS, no bruit, no hepatosplenomegaly, obese Back: no CVA tenderness Ext: no C/C/E        Femoral pulses 2+=         DP/PT pulses intact and = Skin: warm and dry without rash Neuro: CNII-XII intact             Strength intact =  bilaterally  Labs:   Lab Results  Component Value Date   WBC 6.9 03/31/2012   HGB 15.0 03/31/2012   HCT 45.8 03/31/2012   MCV 85.3 03/31/2012   PLT 261 03/31/2012     Lab 03/31/12 1220  NA 142  K 3.6  CL 101  CO2 27  BUN 15  CREATININE 0.60  CALCIUM 10.0  PROT --  BILITOT --  ALKPHOS --  ALT --  AST --  GLUCOSE 82   Lab Results  Component Value Date   CKTOTAL 174 07/21/2009   CKMB 1.5 07/21/2009   TROPONINI <0.30 03/31/2012    Lab Results  Component Value Date   CHOL  Value: 167        ATP III CLASSIFICATION:  <200     mg/dL   Desirable  409-811  mg/dL   Borderline High  >=914    mg/dL   High        12/23/2954   Lab Results  Component Value Date   HDL 76 07/21/2009   Lab Results  Component Value Date   LDLCALC  Value: 77        Total Cholesterol/HDL:CHD Risk Coronary Heart Disease Risk Table                     Men   Women  1/2 Average Risk   3.4   3.3  Average Risk       5.0   4.4  2 X Average Risk   9.6   7.1  3 X Average Risk  23.4   11.0        Use the calculated Patient Ratio above and the CHD Risk Table to determine the patient's CHD Risk.        ATP III CLASSIFICATION (LDL):  <100     mg/dL   Optimal  213-086  mg/dL   Near or Above                    Optimal  130-159  mg/dL   Borderline  578-469  mg/dL   High  >629     mg/dL   Very High 10/17/8411   Lab Results  Component Value Date   TRIG 68 07/21/2009   Lab Results  Component Value Date   CHOLHDL 2.2 07/21/2009   No results found for this basename: LDLDIRECT      Radiology: Dg Chest 2 View  03/31/2012  *RADIOLOGY REPORT*  Clinical Data: Epigastric pain  CHEST - 2 VIEW  Comparison: 07/20/2009  Findings: Mild aortic tortuosity.  Heart size within normal limits. Hypoaeration with interstitial and vascular crowding.  Elevated hemidiaphragms. Linear lung base opacities are likely atelectasis or scarring.  No confluent airspace opacity.  No pleural effusion or pneumothorax.  Multilevel degenerative changes.  No acute  osseous finding.  IMPRESSION: Hypoaeration with interstitial and vascular crowding.   Original Report Authenticated By: Waneta Martins, M.D.    EKG: NSR, WNL  Echo 08/28/2011:  Study Conclusions  - Left ventricle: Wall thickness was increased in a pattern of moderate LVH. There was mild focal basal hypertrophy of the septum. Systolic function was normal. The estimated ejection fraction was in the range of 60% to 65%. Wall motion was normal; there were no regional wall motion abnormalities. Features are consistent with a pseudonormal left ventricular filling pattern, with concomitant abnormal relaxation and increased filling pressure (grade 2 diastolic dysfunction). - Aortic valve: The proximal septum is moderately thickened and the LV outflow tract is very narrow with mild SAM and a mild to moderate graident which worsens with Valsalva. - Mitral valve: There was mild systolic anterior motion. - Left atrium: The atrium was moderately dilated. - Tricuspid valve: Moderate regurgitation. - Pulmonary arteries: PA peak pressure: 46mm Hg (S).  ASSESSMENT AND PLAN:  1. Chest pain possible ACS. Unclear whether this is ACS with lack of objective evidence of ischemia/infarction (normal EKG and initial enzymes). However, pt is at high-risk of ACS with background of HTN, hyperlipidemia, obesity, and tobacco. Recommend an inpatient stress Myoview for further risk stratification. Will rule out for MI with serial enzymes and repeat EKG in am. Will admit to [facility] tele bed since her stress test will be done there. No heparin unless enzymes turn positive or resting chest discomfort occurs.  2. HTN with suboptimal control. Increase amlodipine to 5 mg daily. Will likely need further escalation of Rx. We will probably have to notify her study site about BP med changes and hospital admission.  3.Hyperlipidemia - followed by Dr Ricki Miller. On statin Rx.  4. DVT proph - SCD's  5. Tobacco use - cessation  discussed.  Signed: Tonny Bollman 03/31/2012, 5:29 PM

## 2012-03-31 NOTE — ED Notes (Signed)
Attempted to call report, Charge RN unable to come to the phone. Will call  ER when able to receive report.

## 2012-04-01 ENCOUNTER — Inpatient Hospital Stay (HOSPITAL_COMMUNITY): Payer: BC Managed Care – PPO

## 2012-04-01 ENCOUNTER — Encounter (HOSPITAL_COMMUNITY): Payer: Self-pay | Admitting: Physician Assistant

## 2012-04-01 DIAGNOSIS — I119 Hypertensive heart disease without heart failure: Secondary | ICD-10-CM | POA: Insufficient documentation

## 2012-04-01 DIAGNOSIS — R079 Chest pain, unspecified: Secondary | ICD-10-CM

## 2012-04-01 DIAGNOSIS — R0789 Other chest pain: Secondary | ICD-10-CM

## 2012-04-01 DIAGNOSIS — Z720 Tobacco use: Secondary | ICD-10-CM

## 2012-04-01 LAB — LIPID PANEL
Cholesterol: 171 mg/dL (ref 0–200)
Triglycerides: 113 mg/dL (ref <150)
VLDL: 23 mg/dL (ref 0–40)

## 2012-04-01 MED ORDER — NITROGLYCERIN 0.4 MG SL SUBL: 0.4000 mg | SUBLINGUAL_TABLET | SUBLINGUAL | Status: DC | PRN

## 2012-04-01 MED ORDER — TECHNETIUM TC 99M SESTAMIBI GENERIC - CARDIOLITE
10.0000 | Freq: Once | INTRAVENOUS | Status: AC | PRN
  Administered 2012-04-01: 10 via INTRAVENOUS

## 2012-04-01 MED ORDER — HYDROCHLOROTHIAZIDE 25 MG PO TABS: 25.0000 mg | ORAL_TABLET | Freq: Every day | ORAL | Status: DC

## 2012-04-01 MED ORDER — AMLODIPINE BESYLATE 5 MG PO TABS: 5.0000 mg | ORAL_TABLET | Freq: Every day | ORAL | Status: DC

## 2012-04-01 MED ORDER — TECHNETIUM TC 99M SESTAMIBI GENERIC - CARDIOLITE
30.0000 | Freq: Once | INTRAVENOUS | Status: AC | PRN
  Administered 2012-04-01: 30 via INTRAVENOUS

## 2012-04-01 MED ORDER — ALUM & MAG HYDROXIDE-SIMETH 200-200-20 MG/5ML PO SUSP
30.0000 mL | Freq: Four times a day (QID) | ORAL | Status: DC | PRN
  Administered 2012-04-01: 30 mL via ORAL
  Filled 2012-04-01: qty 30

## 2012-04-01 MED ORDER — HYDROCHLOROTHIAZIDE 25 MG PO TABS
25.0000 mg | ORAL_TABLET | Freq: Every day | ORAL | Status: DC
  Administered 2012-04-01: 25 mg via ORAL
  Filled 2012-04-01: qty 1

## 2012-04-01 NOTE — Progress Notes (Signed)
Patient Name: Kelsey [name] Date of Encounter: 04/01/2012     Principal Problem:  *CHEST PAIN-UNSPECIFIED Active Problems:  HYPERLIPIDEMIA-MIXED  HYPERTENSION, BENIGN  Tobacco abuse    SUBJECTIVE: Feels well. One episode of substernal chest discomfort relieved by Maalox. No sob, palpitations, PND, orthopnea or lightheadedness.    OBJECTIVE  Filed Vitals:   03/31/12 1939 03/31/12 2019 03/31/12 2116 04/01/12 0400  BP: 193/98 137/80 160/69 155/84  Pulse: 67 80 72 59  Temp:   97.8 F (36.6 C) 97.8 F (36.6 C)  TempSrc:   Oral Oral  Resp: 22  20 20   Height:   5\' 5"  (1.651 m)   Weight:   94.711 kg (208 lb 12.8 oz) 94.711 kg (208 lb 12.8 oz)  SpO2: 98% 97% 100% 100%   No intake or output data in the 24 hours ending 04/01/12 0623 Weight change:   PHYSICAL EXAM  General:  Well developed, well nourished, in no acute distress. Head: Normocephalic, atraumatic, sclera non-icteric, no xanthomas, nares are without discharge.  Neck: Supple without bruits or JVD. Lungs:  Distant breath sounds. Resp regular and unlabored, CTAB without wheezes, rales or rhonchi Heart: RRR, harsh III/VI systolic crescendo-decrescendo murmur at RUSB,LLSB no s3, s4 Abdomen: Soft, non-tender, non-distended, BS + x 4.  Msk:  Strength and tone appears normal for age. Extremities: No clubbing, cyanosis or edema. DP/PT/Radials 2+ and equal bilaterally. Neuro: Alert and oriented X 3. Moves all extremities spontaneously. Psych: Normal affect.  LABS:  Recent Labs  [facility] 03/31/12 1220   WBC 6.9   HGB 15.0   HCT 45.8   MCV 85.3   PLT 261   Lab 03/31/12 1220  NA 142  K 3.6  CL 101  CO2 27  BUN 15  CREATININE 0.60  CALCIUM 10.0  PROT --  BILITOT --  ALKPHOS --  ALT --  AST --  AMYLASE --  LIPASE --  GLUCOSE 82   Recent Labs  Basename 03/31/12 2015 03/31/12 1220   CKTOTAL -- --   CKMB -- --   CKMBINDEX -- --   TROPONINI <0.30 <0.30    TELE: NSR/sinus bradycardia, one instance  of a non-conducted P wave listed as a pause (likely compensatory), rare PVCs  ECG: sinus bradycardia, 58 bpm, no ST/T changes (AM tracing unchanged from 10/15).   Radiology/Studies:  Dg Chest 2 View  03/31/2012  *RADIOLOGY REPORT*  Clinical Data: Epigastric pain  CHEST - 2 VIEW  Comparison: 07/20/2009  Findings: Mild aortic tortuosity.  Heart size within normal limits. Hypoaeration with interstitial and vascular crowding.  Elevated hemidiaphragms. Linear lung base opacities are likely atelectasis or scarring.  No confluent airspace opacity.  No pleural effusion or pneumothorax.  Multilevel degenerative changes.  No acute osseous finding.  IMPRESSION: Hypoaeration with interstitial and vascular crowding.   Original Report Authenticated By: Waneta Martins, M.D.     Current Medications:     . amLODipine  2.5 mg Oral Daily  . amLODipine  5 mg Oral Daily  . aspirin  243 mg Oral Once  . aspirin  324 mg Oral NOW   Or  . aspirin  300 mg Rectal NOW  . aspirin EC  81 mg Oral Daily  . atorvastatin  20 mg Oral Daily  . gi cocktail  30 mL Oral Once  . metoprolol  50 mg Oral BID  . naproxen sodium  275 mg Oral BID WC  . prednisoLONE sodium phosphate  1 drop Left Eye BID  .  regadenoson  0.4 mg Intravenous Once  . sodium chloride  3 mL Intravenous Q12H  . DISCONTD: aspirin EC  81 mg Oral Daily    ASSESSMENT AND PLAN:  1. Chest pain- admitted and transferred from [facility] last PM. Cardiac RFs and components of HPI concerning for ACS. CEs neg x 2. EKG unchanged from yesterday- no evidence of ischemia. One episode of substernal chest discomfort last night relieved by Maalox. Scheduled for [employer] today. Currently NPO. If no evidence of ischemia, demand ischemia from elevated BP last PM and reflux likely etiologies.   2. Chronic diastolic CHF- grade 2 diastolic dyfxn, moderate LVH and mild septal hypertrophy evidenced on 08/2011 echo. Euvolemic on exam. Compensated. Likely secondary to  longstanding HTN.   3. Murmur- c/w LVOT obstruction. Echo earlier this year reveals structurally normal AV w/o stenosis. Moderately thickened septum with narrow LVOT, mild SAM, mild-moderate gradient worsened by Valsalva.  3. HTN- BP better controlled, but remains elevated. Increase Norvasc to 10mg  daily. ACEi/BB may be considered as alternatives given CHF. Currently enrolled in [facility] Script study. Will attempt to contact regarding change to antihypertensives.   4. Hyperlipidemia- continue outpatient Lipitor. Check lipid profile.   5. Tobacco abuse- stressed cessation.     Signed, R. Hurman Horn, PA-C 04/01/2012, 6:23 AM  Patient seen, examined. Available data reviewed. Agree with findings, assessment, and plan as outlined by Hurman Horn, PA-C. BP remains uncontrolled. Recommend add HCTZ 25 mg daily. Myoview today. If myoview negative consider discharge home this afternoon.  Tonny Bollman, M.D. 04/01/2012 8:46 AM

## 2012-04-01 NOTE — Progress Notes (Signed)
Update: have contacted Towanda Malkin at [phone], study coordinator for the SPRINT study for hypertension in which the patient is currently enrolled at [facility]. Made aware of current admission and BP med changes - increased Norvasc to 5mg  daily and added HCTZ 25mg  daily. No changes or recommendations.    Jacqulyn Bath, PA-C 04/01/2012 4:02 PM

## 2012-04-01 NOTE — Discharge Summary (Signed)
Patient ID: Kelsey [name],  MRN: [id number], DOB/AGE: [birth year] 66 y.o.  Admit date: 03/31/2012 Discharge date: 04/01/2012  Primary Care Provider: Juline Patch Primary Cardiologist: Lovina Reach, MD  Discharge Diagnoses Principal Problem:  *Midsternal chest pain  **Normal Lexiscan Myoview this admission. Active Problems:  HYPERLIPIDEMIA-MIXED  HYPERTENSION, BENIGN  Tobacco abuse  Allergies Allergies  Allergen Reactions  . Bee Venom Anaphylaxis    Had reaction to bee sting   Procedures  Lexiscan Myoview 04/01/2012  No infarct or ischemia.  EF 69%.  History of Present Illness  66 y/o female with prior h/o chest pain who presented to the [facility] ED on 10/15 with a 3-4 day history of intermittent rest and exertional chest pain.  In the ED her cardiac markers were normal and ECG was non-acute.  She was admitted for further evaluation.  Hospital Course  Patient r/o for MI.  She cont to have intermittent chest pain and underwent lexiscan myoview this AM, which showed normal LV function without evidence of ischemia or infarct.  As a result she will be discharged home today in good condition.  Of note, she has been hypertensive and we have titrated her home dose of amlodipine and have also added HCTZ therapy.  She will require a follow-up blood chemistry in 1 week.  Discharge Vitals Blood pressure 127/61, pulse 83, temperature 98 F (36.7 C), temperature source Oral, resp. rate 18, height 5\' 5"  (1.651 m), weight 208 lb 12.8 oz (94.711 kg), SpO2 97.00%.  Filed Weights   03/31/12 2116 04/01/12 0400  Weight: 208 lb 12.8 oz (94.711 kg) 208 lb 12.8 oz (94.711 kg)   Labs  CBC  Basename 03/31/12 1220  WBC 6.9  NEUTROABS 4.3  HGB 15.0  HCT 45.8  MCV 85.3  PLT 261   Basic Metabolic Panel  Basename 03/31/12 1220  NA 142  K 3.6  CL 101  CO2 27  GLUCOSE 82  BUN 15  CREATININE 0.60  CALCIUM 10.0  MG --  PHOS --   Cardiac Enzymes  Basename 03/31/12 2015 03/31/12 1220    CKTOTAL -- --  CKMB -- --  CKMBINDEX -- --  TROPONINI <0.30 <0.30   Fasting Lipid Panel  Basename 04/01/12 0829  CHOL 171  HDL 85  LDLCALC 63  TRIG 113  CHOLHDL 2.0  LDLDIRECT --   Disposition  Pt is being discharged home today in good condition.  Follow-up Plans & Appointments      Follow-up Information    Follow up with PANG,RICHARD, MD. In 1 week. (You will need a follow-up blood chemistry to assess your electrolytes and kidney function.)    Contact information:   [address] [place], [address] [place] [address] [phone]         Discharge Medications    Medication List     As of 04/01/2012  4:31 PM    TAKE these medications         amLODipine 5 MG tablet   Commonly known as: NORVASC   Take 1 tablet (5 mg total) by mouth daily.      aspirin 81 MG EC tablet   Take 81 mg by mouth daily.      atorvastatin 20 MG tablet   Commonly known as: LIPITOR   Take 20 mg by mouth daily.      CAL-CITRATE PO   Take by mouth.      cyanocobalamin 1000 MCG tablet   Take 1,000 mcg by mouth daily.      EPINEPHrine  0.15 MG/0.3ML injection   Commonly known as: EPIPEN JR   Inject 0.15 mg into the muscle as needed.      ergocalciferol 50000 UNITS capsule   Commonly known as: VITAMIN D2   Take 50,000 Units by mouth 2 (two) times a week. Monday and Thursday      fish oil-omega-3 fatty acids 1000 MG capsule   Take 2 g by mouth daily.      hydrochlorothiazide 25 MG tablet   Commonly known as: HYDRODIURIL   Take 1 tablet (25 mg total) by mouth daily.      metoprolol 50 MG tablet   Commonly known as: LOPRESSOR   Take 50 mg by mouth 2 (two) times daily.      naproxen sodium 220 MG tablet   Commonly known as: ANAPROX   Take 220 mg by mouth 2 (two) times daily with a meal.      nitroGLYCERIN 0.4 MG SL tablet   Commonly known as: NITROSTAT   Place 1 tablet (0.4 mg total) under the tongue every 5 (five) minutes x 3 doses as needed for chest pain.       prednisoLONE sodium phosphate 1 % ophthalmic solution   Commonly known as: INFLAMASE FORTE   Place 1 drop into the left eye 2 (two) times daily. UAD        Outstanding Labs/Studies  BMET in 1 week with PCP.  Duration of Discharge Encounter   Greater than 30 minutes including physician time.  Signed, Nicolasa Ducking NP 04/01/2012, 4:31 PM

## 2012-04-01 NOTE — ED Provider Notes (Signed)
Medical screening examination/treatment/procedure(s) were performed by non-physician practitioner and as supervising physician I was immediately available for consultation/collaboration.   Esabella Stockinger B. Bernette Mayers, MD 04/01/12 1317

## 2012-04-01 NOTE — Progress Notes (Signed)
Pt in stress test in the AM, assessment due once Pt returned to floor

## 2012-10-28 ENCOUNTER — Other Ambulatory Visit (HOSPITAL_COMMUNITY): Payer: Self-pay | Admitting: Nurse Practitioner

## 2012-10-29 ENCOUNTER — Other Ambulatory Visit (HOSPITAL_COMMUNITY): Payer: Self-pay | Admitting: Nurse Practitioner

## 2013-04-12 ENCOUNTER — Encounter: Payer: Self-pay | Admitting: Internal Medicine

## 2013-04-12 ENCOUNTER — Ambulatory Visit (INDEPENDENT_AMBULATORY_CARE_PROVIDER_SITE_OTHER): Payer: Medicare Other | Admitting: Internal Medicine

## 2013-04-12 VITALS — BP 122/76 | HR 57 | Ht 64.0 in | Wt 225.0 lb

## 2013-04-12 DIAGNOSIS — I10 Essential (primary) hypertension: Secondary | ICD-10-CM

## 2013-04-12 LAB — BASIC METABOLIC PANEL
BUN: 25 mg/dL — ABNORMAL HIGH (ref 6–23)
Chloride: 101 mEq/L (ref 96–112)
GFR: 91.26 mL/min (ref >60.00)
Glucose, Bld: 82 mg/dL (ref 70–99)
Potassium: 3.1 mEq/L — ABNORMAL LOW (ref 3.5–5.1)
Sodium: 139 mEq/L (ref 135–145)

## 2013-04-12 LAB — CBC WITH DIFFERENTIAL/PLATELET
Basophils Absolute: 0 10*3/uL (ref 0.0–0.1)
Eosinophils Relative: 2.7 % (ref 0.0–5.0)
HCT: 40.3 % (ref 36.0–46.0)
Hemoglobin: 13.6 g/dL (ref 12.0–15.0)
Lymphs Abs: 2.2 10*3/uL (ref 0.7–4.0)
MCV: 89.2 fl (ref 78.0–100.0)
Monocytes Absolute: 0.5 10*3/uL (ref 0.1–1.0)
Monocytes Relative: 7.5 % (ref 3.0–12.0)
Neutro Abs: 3.7 10*3/uL (ref 1.4–7.7)
RDW: 14.1 % (ref 11.5–14.6)

## 2013-04-12 LAB — LIPID PANEL
HDL: 89.7 mg/dL (ref >39.00)
Total CHOL/HDL Ratio: 3
Triglycerides: 98 mg/dL (ref 0.0–149.0)
VLDL: 19.6 mg/dL (ref 0.0–40.0)

## 2013-04-12 LAB — HEPATIC FUNCTION PANEL
ALT: 19 U/L (ref 0–35)
Bilirubin, Direct: 0.1 mg/dL (ref 0.0–0.3)
Total Bilirubin: 0.8 mg/dL (ref 0.3–1.2)

## 2013-04-12 NOTE — Patient Instructions (Signed)
Your physician wants you to follow-up in: ONE YEAR WITH DR ROSS You will receive a reminder letter in the mail two months in advance. If you don't receive a letter, please call our office to schedule the follow-up appointment.   Your physician recommends that you HAVE LAB WORK TODAY 

## 2013-04-12 NOTE — Progress Notes (Signed)
Patient ID: Kelsey [name], female   DOB: 1946/02/14, 67 y.o.   MRN: [id number] HPIPatient is a 67 year old with a history of chest pain. Negative myoview. 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 (peak grad 34 mm). She also has a history of hypertension. I saw her in clinic in Feb 2013. Since then she was admitted with CP  Had another myoview that was normal.  SInce last fall she denies signif cp  Breathing is OK  Not as active as she should be.   Allergies  Allergen Reactions  . Bee Venom Anaphylaxis    Had reaction to bee sting    Current Outpatient Prescriptions  Medication Sig Dispense Refill  . amLODipine (NORVASC) 5 MG tablet TAKE 1 TABLET BY MOUTH EVERY DAY  30 tablet  2  . aspirin 81 MG EC tablet Take 81 mg by mouth daily.        Marland Kitchen atorvastatin (LIPITOR) 20 MG tablet Take 20 mg by mouth daily.        . Calcium Citrate (CAL-CITRATE PO) Take by mouth.        . cyanocobalamin 1000 MCG tablet Take 1,000 mcg by mouth daily.       Marland Kitchen EPINEPHrine (EPIPEN JR) 0.15 MG/0.3ML injection Inject 0.15 mg into the muscle as needed.      . ergocalciferol (VITAMIN D2) 50000 UNITS capsule Take 50,000 Units by mouth 2 (two) times a week. Monday and Thursday      . fish oil-omega-3 fatty acids 1000 MG capsule Take 2 g by mouth daily.      . hydrochlorothiazide (HYDRODIURIL) 25 MG tablet TAKE 1 TABLET BY MOUTH EVERY DAY  30 tablet  2  . metoprolol (LOPRESSOR) 50 MG tablet Take 50 mg by mouth 2 (two) times daily.      . naproxen sodium (ANAPROX) 220 MG tablet Take 220 mg by mouth 2 (two) times daily with a meal.        . nitroGLYCERIN (NITROSTAT) 0.4 MG SL tablet Place 1 tablet (0.4 mg total) under the tongue every 5 (five) minutes x 3 doses as needed for chest pain.  25 tablet  3  . prednisoLONE sodium phosphate (INFLAMASE FORTE) 1 % ophthalmic solution Place 1 drop into the left eye 2 (two) times daily. UAD        No current facility-administered medications for this visit.    Past Medical History  Diagnosis Date  . Obesity   . HTN (hypertension)   . HLD (hyperlipidemia)   . DJD (degenerative joint disease)   . Heart murmur   . Tobacco abuse     Past Surgical History  Procedure Laterality Date  . Bilateral knee replacements    . Vesicovaginal fistula closure w/ tah    . Corneal transplant    . Abdominal hysterectomy      Family History  Problem Relation Age of Onset  . Hypertension Father   . Hypertension Brother     History   Social History  . Marital Status: Legally Separated    Spouse Name: N/A    Number of Children: N/A  . Years of Education: N/A   Occupational History  . Not on file.   Social History Main Topics  . Smoking status: Current Some Day Smoker    Types: Cigarettes  . Smokeless tobacco: Never Used     Comment: 1/2 ppd   . Alcohol  Use: Yes     Comment: glass of wine 3 times per week  . Drug Use: No  . Sexual Activity: Not on file     Comment: 3 times/week   Other Topics Concern  . 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 Occasional smoking and Etoh.    Review of Systems:  All systems reviewed.  They are negative to the above problem except as previously stated.  Vital Signs: BP 122/76  Pulse 57  Ht 5\' 4"  (1.626 m)  Wt 225 lb (102.059 kg)  BMI 38.6 kg/m2  SpO2 97%  Physical Exam pateint is in NAD HEENT:  Normocephalic, atraumatic. EOMI, PERRLA.  Neck: JVP is normal. No thyromegaly. No bruits.  Lungs: clear to auscultation. No rales no wheezes.  Heart: Regular rate and rhythm. Normal S1, S2. No S3.   Gr II/VI systolic murmur LSB.Marland Kitchen PMI not displaced.  Abdomen:  Supple, nontender. Normal bowel sounds. No masses. No hepatomegaly.  Extremities:   Good distal pulses throughout. No lower extremity edema.  Musculoskeletal :moving all extremities.  Neuro:    alert and oriented x3.  CN II-XII grossly intact.  EKG:  SB 57   bpm.   LVH.    Assessment and Plan:  1.  CP  Denies  2. HL  Will check lipids  3.  HTN  Good control  Check labs    4.  Hyperdynamic LV  Patient with outflow murmur  Echo from 2011-11-23 with vigorous LV function, turbulance through LVOT  Mild SAM  Patient also with moderate TR.  Stay hydrated

## 2013-04-13 ENCOUNTER — Other Ambulatory Visit: Payer: Self-pay | Admitting: *Deleted

## 2013-04-13 DIAGNOSIS — E876 Hypokalemia: Secondary | ICD-10-CM

## 2013-04-13 DIAGNOSIS — E785 Hyperlipidemia, unspecified: Secondary | ICD-10-CM

## 2013-04-13 MED ORDER — POTASSIUM CHLORIDE CRYS ER 20 MEQ PO TBCR: EXTENDED_RELEASE_TABLET | ORAL | Status: DC

## 2013-04-13 MED ORDER — ATORVASTATIN CALCIUM 40 MG PO TABS: 40.0000 mg | ORAL_TABLET | Freq: Every day | ORAL | Status: DC

## 2013-05-24 ENCOUNTER — Encounter: Payer: Self-pay | Admitting: Physical Medicine & Rehabilitation

## 2013-07-02 ENCOUNTER — Encounter: Payer: Medicare Other | Attending: Physical Medicine & Rehabilitation | Admitting: Physical Medicine & Rehabilitation

## 2013-07-02 ENCOUNTER — Encounter: Payer: Self-pay | Admitting: Physical Medicine & Rehabilitation

## 2013-07-02 VITALS — BP 135/70 | HR 54 | Resp 14 | Ht 64.0 in | Wt 230.0 lb

## 2013-07-02 DIAGNOSIS — Z79899 Other long term (current) drug therapy: Secondary | ICD-10-CM

## 2013-07-02 DIAGNOSIS — M25569 Pain in unspecified knee: Secondary | ICD-10-CM

## 2013-07-02 DIAGNOSIS — I10 Essential (primary) hypertension: Secondary | ICD-10-CM | POA: Insufficient documentation

## 2013-07-02 DIAGNOSIS — M549 Dorsalgia, unspecified: Secondary | ICD-10-CM

## 2013-07-02 DIAGNOSIS — M1710 Unilateral primary osteoarthritis, unspecified knee: Secondary | ICD-10-CM | POA: Insufficient documentation

## 2013-07-02 DIAGNOSIS — G8929 Other chronic pain: Secondary | ICD-10-CM | POA: Insufficient documentation

## 2013-07-02 DIAGNOSIS — Z5181 Encounter for therapeutic drug level monitoring: Secondary | ICD-10-CM

## 2013-07-02 DIAGNOSIS — M170 Bilateral primary osteoarthritis of knee: Secondary | ICD-10-CM | POA: Insufficient documentation

## 2013-07-02 DIAGNOSIS — IMO0002 Reserved for concepts with insufficient information to code with codable children: Secondary | ICD-10-CM

## 2013-07-02 DIAGNOSIS — Z96659 Presence of unspecified artificial knee joint: Secondary | ICD-10-CM | POA: Insufficient documentation

## 2013-07-02 MED ORDER — DICLOFENAC SODIUM 1 % TD GEL: 2.0000 g | Freq: Four times a day (QID) | TRANSDERMAL | Status: DC

## 2013-07-02 MED ORDER — NAPROXEN 500 MG PO TABS: 500.0000 mg | ORAL_TABLET | Freq: Every day | ORAL | Status: DC

## 2013-07-02 NOTE — Progress Notes (Signed)
Subjective:    Patient ID: Kelsey [name], female    DOB: [birth year], 68 y.o.   MRN: [id number]  HPI  This is an initial evaluation for Kelsey [name] who has chronic knee pain. She is a pleasant 68 yo african [place] female who has had numerous knee surgeries including replacements and revisions. Her right knee replacement was revised in June of last year and seems to be doing well. Her left knee has been giving her more problems. It bothers her is she stands or walks too long. She has to climb stairs side-ways as the knee won't bend enough. She has spoken to Dr. Dorris CarnesShields (who has performed these surgeries) and he's not anxious to do more surgery due her bone integrity.   For pain currently, she uses alleve, one to two 4 or 5 days of the week. She feels that they aren't working as well as they once did. She is really not having tolerance issues except for some occasional, diarrhea. She has taken omega 3's but these haven't helped.  She's taken no other supplements. She may have been on a pain medication from [facility], but she doesn't remember the name. She has braces but they make her pain worse!  She sometimes uses a cane if her knees are acting up.      Pain Inventory Average Pain 7 Pain Right Now 8 My pain is aching  In the last 24 hours, has pain interfered with the following? General activity 6 Relation with others 6 Enjoyment of life 6 What TIME of day is your pain at its worst? n/a Sleep (in general) Fair  Pain is worse with: walking, bending, sitting and standing Pain improves with: n/a Relief from Meds: 4  Mobility how many minutes can you walk? 5 do you drive?  yes  Function not employed: date last employed 01/01/13 I need assistance with the following:  household duties  Neuro/Psych trouble walking  Prior Studies bone scan x-rays  Physicians involved in your care Primary care .   Family History  Problem Relation Age of Onset  . Hypertension Father   .  Hypertension Brother    History   Social History  . Marital Status: Legally Separated    Spouse Name: N/A    Number of Children: N/A  . Years of Education: N/A   Social History Main Topics  . Smoking status: Current Some Day Smoker    Types: Cigarettes  . Smokeless tobacco: Never Used     Comment: 1/2 ppd   . Alcohol Use: Yes     Comment: glass of wine 3 times per week  . Drug Use: No  . Sexual Activity: None     Comment: 3 times/week   Other Topics Concern  . 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 Occasional smoking and Etoh.   Past Surgical History  Procedure Laterality Date  . Bilateral knee replacements    . Vesicovaginal fistula closure w/ tah    . Corneal transplant    . Abdominal hysterectomy     Past Medical History  Diagnosis Date  . Obesity   . HTN (hypertension)   . HLD (hyperlipidemia)   . DJD (degenerative joint disease)   . Heart murmur   . Tobacco abuse    BP 135/70  Pulse 54  Resp 14  Ht 5\' 4"  (1.626 m)  Wt 230 lb (104.327 kg)  BMI 39.46  kg/m2  SpO2 100%     Review of Systems  Constitutional: Positive for diaphoresis and unexpected weight change.  Cardiovascular: Positive for leg swelling.  Musculoskeletal: Positive for back pain and gait problem.  All other systems reviewed and are negative.       Objective:   Physical Exam   General: Alert and oriented x 3, No apparent distress, morbidly obese HEENT: Head is normocephalic, atraumatic, PERRLA, EOMI, sclera anicteric, oral mucosa pink and moist, dentition intact, ext ear canals clear,  Neck: Supple without JVD or lymphadenopathy Heart: Reg rate and rhythm. No murmurs rubs or gallops Chest: CTA bilaterally without wheezes, rales, or rhonchi; no distress Abdomen: Soft, non-tender, non-distended, bowel sounds positive. Extremities: No clubbing, cyanosis, or edema. Pulses are 2+ Skin: Clean and  intact without signs of breakdown Neuro: Pt is cognitively appropriate with normal insight, memory, and awareness. Cranial nerves 2-12 are intact. Sensory exam is normal. Reflexes are 2+ in all 4's. Fine motor coordination is intact. No tremors. Motor function is grossly 5/5 except with left knee extension and flexion which are 4/5. Musculoskeletal: Both knees show significant scarring from prior surgeries. She has almost full knee ROM on the right. She lacks about 5 deg extension and 15 deg from 90 in flexion on the left. the left knee pops with repeated flexion and extension. There is no instability. She walks with antalgia upon the left knee and the knee tends to want to buckle with weight bearing.  Psych: Pt's affect is appropriate. Pt is cooperative. She is very pleasant        Assessment & Plan:  1. 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 She is non-operative at this point due to poor bone density. 2. HTN   Plan: 1. Begin low dose naproxen 500mg  daily with food. Asked her to check with her PCP to make sure this is ok given ?CV history. 2. Add voltaren gel for knee pain, TID 3. Encouraged her to get into her pool to work on simple walking to build up strength in her left quad and hamstrings well as calf.  4. Weight loss/ diet are also important. 5. Follow up with me in about 6 weeks. 30 minutes of face to face patient care time were spent during this visit. All questions were encouraged and answered. Consider low dose tramadol pending response to the above.

## 2013-07-02 NOTE — Patient Instructions (Signed)
Work on regular walking as much as you can.   I would like for you to get to your pool and walk in the water using a raft or float to help with balance and to help unload your left knee.

## 2013-07-07 ENCOUNTER — Telehealth: Payer: Self-pay | Admitting: Physical Medicine & Rehabilitation

## 2013-07-07 NOTE — Telephone Encounter (Signed)
ETOH on UDS.  Please note in chart

## 2013-08-17 ENCOUNTER — Encounter: Payer: Medicare Other | Admitting: Physical Medicine & Rehabilitation

## 2013-09-23 ENCOUNTER — Other Ambulatory Visit: Payer: Self-pay

## 2013-09-23 DIAGNOSIS — E876 Hypokalemia: Secondary | ICD-10-CM

## 2013-09-23 MED ORDER — POTASSIUM CHLORIDE CRYS ER 20 MEQ PO TBCR: EXTENDED_RELEASE_TABLET | ORAL | Status: DC

## 2013-10-06 ENCOUNTER — Encounter: Payer: Medicare Other | Attending: Physical Medicine & Rehabilitation | Admitting: Physical Medicine & Rehabilitation

## 2013-10-06 ENCOUNTER — Encounter: Payer: Self-pay | Admitting: Physical Medicine & Rehabilitation

## 2013-10-06 VITALS — BP 131/72 | HR 54 | Resp 14 | Ht 64.0 in | Wt 236.4 lb

## 2013-10-06 DIAGNOSIS — IMO0002 Reserved for concepts with insufficient information to code with codable children: Secondary | ICD-10-CM | POA: Insufficient documentation

## 2013-10-06 DIAGNOSIS — I10 Essential (primary) hypertension: Secondary | ICD-10-CM

## 2013-10-06 DIAGNOSIS — M170 Bilateral primary osteoarthritis of knee: Secondary | ICD-10-CM

## 2013-10-06 DIAGNOSIS — M1710 Unilateral primary osteoarthritis, unspecified knee: Secondary | ICD-10-CM

## 2013-10-06 DIAGNOSIS — M25569 Pain in unspecified knee: Secondary | ICD-10-CM | POA: Insufficient documentation

## 2013-10-06 MED ORDER — NAPROXEN 500 MG PO TABS: 500.0000 mg | ORAL_TABLET | Freq: Every day | ORAL | Status: DC

## 2013-10-06 NOTE — Progress Notes (Signed)
Subjective:    Patient ID: Kelsey EmeryCarolyn J [name], female    DOB: 26-Jun-1945, 68 y.o.   MRN: [id number]  HPI  Kelsey [name] is back regarding her knee pain. The naproxen worked well for her. She has tried to back off the naproxen but usually can only go a couple days before she needs to start back on it.   She is exercising on a fairly regular basis. She plans on getting into water aerobics soon  Her left knee still cracks and pops sometimes with associated swelling.   Pain Inventory Average Pain 3 Pain Right Now 3 My pain is aching  In the last 24 hours, has pain interfered with the following? General activity 4 Relation with others 3 Enjoyment of life 0 What TIME of day is your pain at its worst? morning Sleep (in general) Fair  Pain is worse with: walking, bending, standing and some activites Pain improves with: rest, therapy/exercise and medication Relief from Meds: 7  Mobility walk without assistance use a cane how many minutes can you walk? 3 ability to climb steps?  no do you drive?  yes  Function retired I need assistance with the following:  meal prep, household duties and shopping  Neuro/Psych trouble walking  Prior Studies Any changes since last visit?  no  Physicians involved in your care Any changes since last visit?  no   Family History  Problem Relation Age of Onset  . Hypertension Father   . Hypertension Brother    History   Social History  . Marital Status: Legally Separated    Spouse Name: N/A    Number of Children: N/A  . Years of Education: N/A   Social History Main Topics  . Smoking status: Current Some Day Smoker    Types: Cigarettes  . Smokeless tobacco: Never Used     Comment: 1/2 ppd   . Alcohol Use: Yes     Comment: glass of wine 3 times per week  . Drug Use: No  . Sexual Activity: None     Comment: 3 times/week   Other Topics Concern  . 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 Occasional smoking and Etoh.   Past Surgical History  Procedure Laterality Date  . Bilateral knee replacements    . Vesicovaginal fistula closure w/ tah    . Corneal transplant    . Abdominal hysterectomy     Past Medical History  Diagnosis Date  . Obesity   . HTN (hypertension)   . HLD (hyperlipidemia)   . DJD (degenerative joint disease)   . Heart murmur   . Tobacco abuse    BP 131/72  Pulse 54  Resp 14  Ht 5\' 4"  (1.626 m)  Wt 236 lb 6.4 oz (107.23 kg)  BMI 40.56 kg/m2  SpO2 97%  Opioid Risk Score:   Fall Risk Score: High Fall Risk (>13 points) (educated and handout on fall prevention in the home given to pt) Review of Systems  Constitutional: Positive for diaphoresis and unexpected weight change.  Respiratory: Positive for shortness of breath.   Musculoskeletal: Positive for gait problem.  All other systems reviewed and are negative.      Objective:   Physical Exam General: Alert and oriented x 3, No apparent distress, morbidly obese  HEENT: Head is normocephalic, atraumatic, PERRLA, EOMI, sclera anicteric, oral mucosa pink and moist, dentition intact, ext ear canals clear,  Neck: Supple without JVD or lymphadenopathy  Heart: Reg rate and rhythm. No murmurs rubs or gallops  Chest: CTA bilaterally without wheezes, rales, or rhonchi; no distress  Abdomen: Soft, non-tender, non-distended, bowel sounds positive.  Extremities: No clubbing, cyanosis, or edema. Pulses are 2+  Skin: Clean and intact without signs of breakdown  Neuro: Pt is cognitively appropriate with normal insight, memory, and awareness. Cranial nerves 2-12 are intact. Sensory exam is normal. Reflexes are 2+ in all 4's. Fine motor coordination is intact. No tremors. Motor function is grossly 5/5 except with left knee extension and flexion which are 4/5.  Musculoskeletal: Both knees show significant scarring from prior surgeries. She has almost full knee ROM on the  right. She lacks about 5 deg extension and 15 deg from 90 in flexion on the left. There is mild crepitus and swelling still on the left leg. She walks with less antalgia today. She uses her cane for balance. No knee instability is seen. Psych: Pt's affect is appropriate. Pt is cooperative. She is very pleasant   Assessment & Plan:   1. 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 She is non-operative at this point due to poor bone density.  2. HTN    Plan:  1. Continue low dose naproxen 500mg  qd prn. May try OTC naproxen 220mg  in place in efforts to taper. Also needs to utilize tylenol . Could consider tramadol down the line. 2. Encouraged her to get into her pool to work on simple walking to build up strength in her left quad and hamstrings well as calf.  4. Weight loss/ diet are also important.  5. Follow up with me in about 4 months. 15 minutes of face to face patient care time were spent during this visit. All questions were encouraged and answered.

## 2013-10-06 NOTE — Patient Instructions (Signed)
TRY TAKING YOUR ALLEVE IN PLACE OF THE PRESCRIPTION NAPROXEN---IT IS JUST A LOWER DOSE OF THE SAME MED   ALSO TRY ACETAMINOPHEN 500MG  UP TO 3 X DAILY

## 2013-12-06 ENCOUNTER — Emergency Department (HOSPITAL_COMMUNITY)
Admission: EM | Admit: 2013-12-06 | Discharge: 2013-12-06 | Disposition: A | Discharge status: Home / Self Care | Payer: Medicare Other | Source: Home / Self Care | Attending: Family Medicine | Admitting: Family Medicine

## 2013-12-06 ENCOUNTER — Encounter (HOSPITAL_COMMUNITY): Payer: Self-pay | Admitting: Emergency Medicine

## 2013-12-06 DIAGNOSIS — N390 Urinary tract infection, site not specified: Secondary | ICD-10-CM

## 2013-12-06 LAB — POCT URINALYSIS DIP (DEVICE)
BILIRUBIN URINE: NEGATIVE
GLUCOSE, UA: NEGATIVE mg/dL
Ketones, ur: NEGATIVE mg/dL
NITRITE: POSITIVE — AB
Protein, ur: 100 mg/dL — AB
Specific Gravity, Urine: >=1.03 (ref 1.005–1.030)
Urobilinogen, UA: 2 mg/dL — ABNORMAL HIGH (ref 0.0–1.0)
pH: 6 (ref 5.0–8.0)

## 2013-12-06 MED ORDER — CEPHALEXIN 500 MG PO CAPS: 500.0000 mg | ORAL_CAPSULE | Freq: Four times a day (QID) | ORAL | Status: DC

## 2013-12-06 NOTE — Discharge Instructions (Signed)

## 2013-12-06 NOTE — ED Notes (Signed)
Pt triaged and assessed by provider.   Provider in before nurse. 

## 2013-12-06 NOTE — ED Provider Notes (Signed)
CSN: [id number]     Arrival date & time 12/06/13  1941 History   First MD Initiated Contact with Patient 12/06/13 2102     Chief Complaint  Patient presents with  . Back Pain   (Consider location/radiation/quality/duration/timing/severity/associated sxs/prior Treatment) Patient is a 68 y.o. female presenting with back pain. The history is provided by the patient. No language interpreter was used.  Back Pain Location:  Lumbar spine Quality:  Aching Radiates to:  Does not radiate Pain severity:  Moderate Pain is:  Same all the time Onset quality:  Gradual Duration:  3 days Timing:  Constant Progression:  Worsening Chronicity:  New Relieved by:  Nothing Worsened by:  Nothing tried Ineffective treatments:  Bed rest Associated symptoms: dysuria   Associated symptoms: no fever   Pt reports burning with urination.   Pt reports began having after intercours last week  Past Medical History  Diagnosis Date  . Obesity   . HTN (hypertension)   . HLD (hyperlipidemia)   . DJD (degenerative joint disease)   . Heart murmur   . Tobacco abuse    Past Surgical History  Procedure Laterality Date  . Bilateral knee replacements    . Vesicovaginal fistula closure w/ tah    . Corneal transplant    . Abdominal hysterectomy     Family History  Problem Relation Age of Onset  . Hypertension Father   . Hypertension Brother    History  Substance Use Topics  . Smoking status: Current Some Day Smoker    Types: Cigarettes  . Smokeless tobacco: Never Used     Comment: 1/2 ppd   . Alcohol Use: Yes     Comment: glass of wine 3 times per week   OB History   Grav Para Term Preterm Abortions TAB SAB Ect Mult Living                 Review of Systems  Constitutional: Negative for fever.  Genitourinary: Positive for dysuria.  Musculoskeletal: Positive for back pain.  All other systems reviewed and are negative.   Allergies  Bee venom  Home Medications   Prior to Admission medications    Medication Sig Start Date End Date Taking? Authorizing Provider  amLODipine (NORVASC) 5 MG tablet TAKE 1 TABLET BY MOUTH EVERY DAY 10/28/12  Yes Pricilla RifflePaula Ross V, MD  aspirin 81 MG EC tablet Take 81 mg by mouth daily.     Yes Historical Provider, MD  atorvastatin (LIPITOR) 40 MG tablet Take 1 tablet (40 mg total) by mouth daily. 04/13/13  Yes Pricilla RifflePaula Ross V, MD  Calcium Citrate (CAL-CITRATE PO) Take by mouth.     Yes Historical Provider, MD  ergocalciferol (VITAMIN D2) 50000 UNITS capsule Take 50,000 Units by mouth 2 (two) times a week. Monday and Thursday   Yes Historical Provider, MD  fish oil-omega-3 fatty acids 1000 MG capsule Take 2 g by mouth daily.   Yes Historical Provider, MD  metoprolol (LOPRESSOR) 50 MG tablet Take 50 mg by mouth 2 (two) times daily.   Yes Historical Provider, MD  potassium chloride SA (K-DUR,KLOR-CON) 20 MEQ tablet Take two tablets now then one tablet once daily 09/23/13  Yes Pricilla RifflePaula Ross V, MD  prednisoLONE sodium phosphate (INFLAMASE FORTE) 1 % ophthalmic solution Place 1 drop into the left eye 2 (two) times daily. UAD   Yes Historical Provider, MD  cyanocobalamin 1000 MCG tablet Take 1,000 mcg by mouth daily.     Historical Provider, MD  EPINEPHrine [facility](EPIPEN  JR) 0.15 MG/0.3ML injection Inject 0.15 mg into the muscle as needed.    Historical Provider, MD  naproxen (NAPROSYN) 500 MG tablet Take 1 tablet (500 mg total) by mouth daily before breakfast. 10/06/13   Ranelle OysterZachary T Swartz, MD  nitroGLYCERIN (NITROSTAT) 0.4 MG SL tablet Place 1 tablet (0.4 mg total) under the tongue every 5 (five) minutes x 3 doses as needed for chest pain. 04/01/12   Ok Anishristopher R Berge, NP   There were no vitals taken for this visit. Physical Exam  Nursing note and vitals reviewed. Constitutional: She is oriented to person, place, and time. She appears well-developed and well-nourished.  HENT:  Head: Normocephalic and atraumatic.  Eyes: EOM are normal. Pupils are equal, round, and reactive to light.   Neck: Normal range of motion.  Pulmonary/Chest: Effort normal.  Abdominal: Soft. She exhibits no distension.  Musculoskeletal: Normal range of motion.  Neurological: She is alert and oriented to person, place, and time.  Skin: Skin is warm.  Psychiatric: She has a normal mood and affect.    ED Course  Procedures (including critical care time) Labs Review Labs Reviewed  POCT URINALYSIS DIP (DEVICE) - Abnormal; Notable for the following:    Hgb urine dipstick LARGE (*)    Protein, ur 100 (*)    Urobilinogen, UA 2.0 (*)    Nitrite POSITIVE (*)    Leukocytes, UA SMALL (*)    All other components within normal limits    Imaging Review No results found.   MDM   1. UTI (lower urinary tract infection)    Urine shows hemoglobin, nitrates and leukocytes.   Culture pending   I will treat with keflex Pt advised to see Dr. Ricki MillerPang for recheck in 2-3 days.    Lonia SkinnerLeslie K Smiths FerrySofia, PA-C 12/06/13 2129

## 2013-12-07 NOTE — ED Provider Notes (Signed)
Medical screening examination/treatment/procedure(s) were performed by resident physician or non-physician practitioner and as supervising physician I was immediately available for consultation/collaboration.   KINDL,JAMES DOUGLAS MD.   James D Kindl, MD 12/07/13 1519 

## 2013-12-10 LAB — URINE CULTURE: Special Requests: NORMAL

## 2013-12-13 NOTE — ED Notes (Signed)
Urine culture: >100,000 colonies E. Coli and Proteus Mirabilis.  Pt. adequately treated with Keflex. Vassie MoselleYork, Julicia Krieger M 12/13/2013

## 2013-12-20 ENCOUNTER — Emergency Department (HOSPITAL_COMMUNITY): Payer: Medicare Other

## 2013-12-20 ENCOUNTER — Encounter (HOSPITAL_COMMUNITY): Payer: Self-pay | Admitting: Emergency Medicine

## 2013-12-20 ENCOUNTER — Emergency Department (HOSPITAL_COMMUNITY)
Admission: EM | Admit: 2013-12-20 | Discharge: 2013-12-20 | Disposition: A | Discharge status: Home / Self Care | Payer: Medicare Other | Attending: Emergency Medicine | Admitting: Emergency Medicine

## 2013-12-20 DIAGNOSIS — F17200 Nicotine dependence, unspecified, uncomplicated: Secondary | ICD-10-CM | POA: Insufficient documentation

## 2013-12-20 DIAGNOSIS — Z79899 Other long term (current) drug therapy: Secondary | ICD-10-CM | POA: Insufficient documentation

## 2013-12-20 DIAGNOSIS — Z8739 Personal history of other diseases of the musculoskeletal system and connective tissue: Secondary | ICD-10-CM | POA: Insufficient documentation

## 2013-12-20 DIAGNOSIS — E669 Obesity, unspecified: Secondary | ICD-10-CM | POA: Insufficient documentation

## 2013-12-20 DIAGNOSIS — E785 Hyperlipidemia, unspecified: Secondary | ICD-10-CM | POA: Insufficient documentation

## 2013-12-20 DIAGNOSIS — N200 Calculus of kidney: Secondary | ICD-10-CM | POA: Insufficient documentation

## 2013-12-20 DIAGNOSIS — Z7982 Long term (current) use of aspirin: Secondary | ICD-10-CM | POA: Insufficient documentation

## 2013-12-20 DIAGNOSIS — I10 Essential (primary) hypertension: Secondary | ICD-10-CM | POA: Insufficient documentation

## 2013-12-20 DIAGNOSIS — Z791 Long term (current) use of non-steroidal anti-inflammatories (NSAID): Secondary | ICD-10-CM | POA: Insufficient documentation

## 2013-12-20 DIAGNOSIS — IMO0002 Reserved for concepts with insufficient information to code with codable children: Secondary | ICD-10-CM | POA: Insufficient documentation

## 2013-12-20 DIAGNOSIS — N390 Urinary tract infection, site not specified: Secondary | ICD-10-CM | POA: Insufficient documentation

## 2013-12-20 DIAGNOSIS — R011 Cardiac murmur, unspecified: Secondary | ICD-10-CM | POA: Insufficient documentation

## 2013-12-20 DIAGNOSIS — Z792 Long term (current) use of antibiotics: Secondary | ICD-10-CM | POA: Insufficient documentation

## 2013-12-20 LAB — COMPREHENSIVE METABOLIC PANEL
ALK PHOS: 161 U/L — AB (ref 39–117)
ALT: 20 U/L (ref 0–35)
ANION GAP: 14 (ref 5–15)
AST: 24 U/L (ref 0–37)
Albumin: 4 g/dL (ref 3.5–5.2)
BUN: 20 mg/dL (ref 6–23)
CALCIUM: 9.7 mg/dL (ref 8.4–10.5)
CO2: 25 meq/L (ref 19–32)
Chloride: 101 mEq/L (ref 96–112)
Creatinine, Ser: 0.74 mg/dL (ref 0.50–1.10)
GFR, EST NON AFRICAN AMERICAN: 87 mL/min — AB (ref >90)
GLUCOSE: 116 mg/dL — AB (ref 70–99)
Potassium: 4.1 mEq/L (ref 3.7–5.3)
Sodium: 140 mEq/L (ref 137–147)
TOTAL PROTEIN: 7.8 g/dL (ref 6.0–8.3)
Total Bilirubin: 0.6 mg/dL (ref 0.3–1.2)

## 2013-12-20 LAB — URINALYSIS, ROUTINE W REFLEX MICROSCOPIC
Bilirubin Urine: NEGATIVE
Glucose, UA: NEGATIVE mg/dL
Ketones, ur: NEGATIVE mg/dL
NITRITE: NEGATIVE
Protein, ur: NEGATIVE mg/dL
SPECIFIC GRAVITY, URINE: 1.017 (ref 1.005–1.030)
Urobilinogen, UA: 2 mg/dL — ABNORMAL HIGH (ref 0.0–1.0)
pH: 7 (ref 5.0–8.0)

## 2013-12-20 LAB — CBC WITH DIFFERENTIAL/PLATELET
Basophils Absolute: 0 10*3/uL (ref 0.0–0.1)
Basophils Relative: 0 % (ref 0–1)
EOS ABS: 0.1 10*3/uL (ref 0.0–0.7)
EOS PCT: 1 % (ref 0–5)
HEMATOCRIT: 40.3 % (ref 36.0–46.0)
HEMOGLOBIN: 13.5 g/dL (ref 12.0–15.0)
LYMPHS ABS: 1.5 10*3/uL (ref 0.7–4.0)
LYMPHS PCT: 14 % (ref 12–46)
MCH: 29.9 pg (ref 26.0–34.0)
MCHC: 33.5 g/dL (ref 30.0–36.0)
MCV: 89.2 fL (ref 78.0–100.0)
MONOS PCT: 5 % (ref 3–12)
Monocytes Absolute: 0.5 10*3/uL (ref 0.1–1.0)
Neutro Abs: 8.7 10*3/uL — ABNORMAL HIGH (ref 1.7–7.7)
Neutrophils Relative %: 80 % — ABNORMAL HIGH (ref 43–77)
Platelets: 271 10*3/uL (ref 150–400)
RBC: 4.52 MIL/uL (ref 3.87–5.11)
RDW: 16.2 % — ABNORMAL HIGH (ref 11.5–15.5)
WBC: 10.7 10*3/uL — AB (ref 4.0–10.5)

## 2013-12-20 LAB — URINE MICROSCOPIC-ADD ON

## 2013-12-20 LAB — LIPASE, BLOOD: Lipase: 47 U/L (ref 11–59)

## 2013-12-20 MED ORDER — OXYCODONE-ACETAMINOPHEN 5-325 MG PO TABS: 1.0000 | ORAL_TABLET | ORAL | Status: DC | PRN

## 2013-12-20 MED ORDER — KETOROLAC TROMETHAMINE 30 MG/ML IJ SOLN: 30.0000 mg | Freq: Once | INTRAMUSCULAR | Status: DC

## 2013-12-20 MED ORDER — CIPROFLOXACIN HCL 500 MG PO TABS: 500.0000 mg | ORAL_TABLET | Freq: Two times a day (BID) | ORAL | Status: DC

## 2013-12-20 MED ORDER — KETOROLAC TROMETHAMINE 30 MG/ML IJ SOLN
30.0000 mg | Freq: Once | INTRAMUSCULAR | Status: AC
  Administered 2013-12-20: 30 mg via INTRAVENOUS
  Filled 2013-12-20: qty 1

## 2013-12-20 MED ORDER — ONDANSETRON HCL 4 MG/2ML IJ SOLN
4.0000 mg | Freq: Once | INTRAMUSCULAR | Status: AC
  Administered 2013-12-20: 4 mg via INTRAVENOUS
  Filled 2013-12-20: qty 2

## 2013-12-20 MED ORDER — METOCLOPRAMIDE HCL 5 MG/ML IJ SOLN
10.0000 mg | Freq: Once | INTRAMUSCULAR | Status: AC
  Administered 2013-12-20: 10 mg via INTRAVENOUS
  Filled 2013-12-20: qty 2

## 2013-12-20 MED ORDER — ONDANSETRON HCL 4 MG/2ML IJ SOLN
4.0000 mg | Freq: Once | INTRAMUSCULAR | Status: DC
  Filled 2013-12-20: qty 2

## 2013-12-20 MED ORDER — HYDROMORPHONE HCL PF 1 MG/ML IJ SOLN
0.5000 mg | Freq: Once | INTRAMUSCULAR | Status: AC
  Administered 2013-12-20: 0.5 mg via INTRAVENOUS
  Filled 2013-12-20: qty 1

## 2013-12-20 MED ORDER — CIPROFLOXACIN IN D5W 400 MG/200ML IV SOLN
400.0000 mg | Freq: Once | INTRAVENOUS | Status: AC
  Administered 2013-12-20: 400 mg via INTRAVENOUS
  Filled 2013-12-20: qty 200

## 2013-12-20 MED ORDER — ONDANSETRON HCL 8 MG PO TABS: 8.0000 mg | ORAL_TABLET | Freq: Three times a day (TID) | ORAL | Status: DC | PRN

## 2013-12-20 MED ORDER — SODIUM CHLORIDE 0.9 % IV SOLN
INTRAVENOUS | Status: DC
  Administered 2013-12-20: 03:00:00 via INTRAVENOUS

## 2013-12-20 NOTE — ED Provider Notes (Signed)
6:00 AM Patient signed out at shift change by Lottie Musselatyana A Kirichenko, PA-C.  Patient with an infected kidney stone.  Lemont Fillersatyana has discussed with Urology and patient is to follow up outpatient.  Plan is for the patient to be discharged once symptoms are controlled.    7:00 AM Patient tolerating PO liquids.  Patient reports that nausea and pain have improved.  Patient stable for discharge.  Return precautions given.  Santiago GladHeather Aylan Bayona, PA-C 12/21/13 1859

## 2013-12-20 NOTE — ED Notes (Signed)
Pt reports pain and nausea are much better.  PO challenged pt w/ cup of water per PA. Pt tolerating w/o difficulty at this time.

## 2013-12-20 NOTE — ED Provider Notes (Signed)
CSN: [id number]     Arrival date & time 12/20/13  0233 History   First MD Initiated Contact with Patient 12/20/13 0254     Chief Complaint  Patient presents with  . Abdominal Pain     (Consider location/radiation/quality/duration/timing/severity/associated sxs/prior Treatment) HPI Kelsey [name] is a 68 y.o. female who presents emergency complaining of abdominal pain, nausea, vomiting, left flank pain. Pt states his symptoms began approximately 4 hours ago. States pain in the abdomen is sharp, pain in the flank is throbbing. She denies any fever, chills. She states she has had 2 bowel movements today both normal. She denies any urinary symptoms. She denies any vaginal complaints. Took a "pain pill" for pain with no relief.  Nothing making symptoms better or worse. States recently diagnosed with UTI, still taking antibiotics, does not remember the name.  Past Medical History  Diagnosis Date  . Obesity   . HTN (hypertension)   . HLD (hyperlipidemia)   . DJD (degenerative joint disease)   . Heart murmur   . Tobacco abuse    Past Surgical History  Procedure Laterality Date  . Bilateral knee replacements    . Vesicovaginal fistula closure w/ tah    . Corneal transplant    . Abdominal hysterectomy     Family History  Problem Relation Age of Onset  . Hypertension Father   . Hypertension Brother    History  Substance Use Topics  . Smoking status: Current Some Day Smoker    Types: Cigarettes  . Smokeless tobacco: Never Used     Comment: 1/2 ppd   . Alcohol Use: Yes     Comment: glass of wine 3 times per week   OB History   Grav Para Term Preterm Abortions TAB SAB Ect Mult Living                 Review of Systems  Constitutional: Negative for fever and chills.  Respiratory: Negative for cough, chest tightness and shortness of breath.   Cardiovascular: Negative for chest pain, palpitations and leg swelling.  Gastrointestinal: Positive for nausea, vomiting and abdominal  pain. Negative for diarrhea.  Genitourinary: Positive for flank pain. Negative for dysuria and pelvic pain.  Musculoskeletal: Negative for arthralgias, myalgias, neck pain and neck stiffness.  Skin: Negative for rash.  Neurological: Negative for dizziness, weakness and headaches.  All other systems reviewed and are negative.     Allergies  Bee venom  Home Medications   Prior to Admission medications   Medication Sig Start Date End Date Taking? Authorizing Provider  amLODipine (NORVASC) 5 MG tablet Take 5 mg by mouth daily.   Yes Historical Provider, MD  aspirin 81 MG EC tablet Take 81 mg by mouth daily.      Historical Provider, MD  atorvastatin (LIPITOR) 40 MG tablet Take 1 tablet (40 mg total) by mouth daily. 04/13/13   Pricilla Riffle, MD  Calcium Citrate (CAL-CITRATE PO) Take by mouth.      Historical Provider, MD  cephALEXin (KEFLEX) 500 MG capsule Take 1 capsule (500 mg total) by mouth 4 (four) times daily. 12/06/13   Elson Areas, PA-C  cyanocobalamin 1000 MCG tablet Take 1,000 mcg by mouth daily.     Historical Provider, MD  EPINEPHrine (EPIPEN JR) 0.15 MG/0.3ML injection Inject 0.15 mg into the muscle as needed.    Historical Provider, MD  ergocalciferol (VITAMIN D2) 50000 UNITS capsule Take 50,000 Units by mouth 2 (two) times a week. Monday and Thursday  Historical Provider, MD  fish oil-omega-3 fatty acids 1000 MG capsule Take 2 g by mouth daily.    Historical Provider, MD  metoprolol (LOPRESSOR) 50 MG tablet Take 50 mg by mouth 2 (two) times daily.    Historical Provider, MD  naproxen (NAPROSYN) 500 MG tablet Take 1 tablet (500 mg total) by mouth daily before breakfast. 10/06/13   Ranelle OysterZachary T Swartz, MD  nitroGLYCERIN (NITROSTAT) 0.4 MG SL tablet Place 1 tablet (0.4 mg total) under the tongue every 5 (five) minutes x 3 doses as needed for chest pain. 04/01/12   Ok Anishristopher R Berge, NP  potassium chloride SA (K-DUR,KLOR-CON) 20 MEQ tablet Take two tablets now then one tablet once  daily 09/23/13   Pricilla RifflePaula Ross V, MD  prednisoLONE sodium phosphate (INFLAMASE FORTE) 1 % ophthalmic solution Place 1 drop into the left eye 2 (two) times daily. UAD    Historical Provider, MD   BP 183/84  Pulse 72  Temp(Src) 98.2 F (36.8 C) (Oral)  Resp 18  SpO2 100% Physical Exam  Nursing note and vitals reviewed. Constitutional: She appears well-developed and well-nourished. No distress.  HENT:  Head: Normocephalic.  Eyes: Conjunctivae are normal.  Neck: Neck supple.  Cardiovascular: Normal rate, regular rhythm and normal heart sounds.   Pulmonary/Chest: Effort normal and breath sounds normal. No respiratory distress. She has no wheezes. She has no rales.  Abdominal: Soft. Bowel sounds are normal. She exhibits no distension. There is tenderness. There is no rebound.  Left CVA tenderness, suprapubic tenderness  Musculoskeletal: She exhibits no edema.  Neurological: She is alert.  Skin: Skin is warm and dry.  Psychiatric: She has a normal mood and affect. Her behavior is normal.    ED Course  Procedures (including critical care time) Labs Review Labs Reviewed  CBC WITH DIFFERENTIAL - Abnormal; Notable for the following:    WBC 10.7 (*)    RDW 16.2 (*)    Neutrophils Relative % 80 (*)    Neutro Abs 8.7 (*)    All other components within normal limits  COMPREHENSIVE METABOLIC PANEL - Abnormal; Notable for the following:    Glucose, Bld 116 (*)    Alkaline Phosphatase 161 (*)    GFR calc non Af Amer 87 (*)    All other components within normal limits  URINALYSIS, ROUTINE W REFLEX MICROSCOPIC - Abnormal; Notable for the following:    APPearance CLOUDY (*)    Hgb urine dipstick LARGE (*)    Urobilinogen, UA 2.0 (*)    Leukocytes, UA MODERATE (*)    All other components within normal limits  URINE CULTURE  LIPASE, BLOOD  URINE MICROSCOPIC-ADD ON    Imaging Review Ct Abdomen Pelvis Wo Contrast  12/20/2013   CLINICAL DATA:  Left flank and medial abdominal pain. Recent  treatment for urinary tract infection. Red cells and white cells in urine.  EXAM: CT ABDOMEN AND PELVIS WITHOUT CONTRAST  TECHNIQUE: Multidetector CT imaging of the abdomen and pelvis was performed following the standard protocol without IV contrast.  COMPARISON:  None.  FINDINGS: Mild dependent changes in the lung bases.  There is a 5 mm stone in the proximal left ureter at the ureteropelvic junction. There is pyelocaliectasis with perirenal and periureteral stranding. The distal left ureter is decompressed. No stones or hydroureter on the right. Bladder wall is mildly thickened which could indicate infection. No bladder stones are visualized. Small cyst in the right kidney.  The unenhanced appearance of the liver, spleen, gallbladder, pancreas, adrenal glands,  abdominal aorta, inferior vena cava, and retroperitoneal lymph nodes is unremarkable. The stomach, small bowel, and colon are decompressed. No free air or free fluid in the abdomen.  Pelvis: No pelvic mass or lymphadenopathy. No diverticulitis. Appendix is normal. No free or loculated pelvic fluid collections. No destructive bone lesions. Compression of the superior endplate of L1 probably relating to Schmorl's node. Degenerative changes in the lower thoracic spine.  IMPRESSION: 5 mm stone in the proximal left ureter with moderate proximal obstruction. Bladder wall thickening may indicate infection.   Electronically Signed   By: Burman NievesWilliam  Stevens M.D.   On: 12/20/2013 05:21     EKG Interpretation None      MDM   Final diagnoses:  Left nephrolithiasis  UTI (lower urinary tract infection)    Patient with lower abdominal pain and left flank pain. Recently diagnosed with a UTI. Still taking Keflex. She's also having nausea vomiting. She's afebrile. Abdomen is soft, no peritoneal signs. We'll get labs, urinalysis. Dilaudid and Zofran ordered for her symptoms.  4:27 AM Patient's urine continues to show infection. Will give cipro here iv. Will get  CT abd/pelvis wo contrast for further evaluation. Pt feeling better with medications.   5:55 AM Discussed UA and CT results with Dr. Mena GoesEskridge with urology, at this time, continue antibiotics, pain management, follow up in the office. Pt is feeling better. Continues to have nausea. Will try reglan. Pt is afebrile. Non toxic appearing. Tolerating PO fluids.   Filed Vitals:   12/20/13 0242 12/20/13 0442  BP: 183/84 148/73  Pulse: 72 79  Temp: 98.2 F (36.8 C) 98.2 F (36.8 C)  TempSrc: Oral Oral  Resp: 18 16  SpO2: 100% 97%        Myriam Jacobsonatyana A Tamar Miano, PA-C 12/20/13 0602

## 2013-12-20 NOTE — ED Notes (Signed)
Pt arrived to the ED with a compliant of abdominal pain.  Pt states the pain began around 2200 hrs yesterday.  Pt states pain is located lower abdomen medially.  Pt describes pain as an upset feeling.  Pt also is complaining of flank pain that is located in the left mid back distally.  Pt describes the back pain as throbbing.

## 2013-12-20 NOTE — ED Provider Notes (Signed)
Medical screening examination/treatment/procedure(s) were conducted as a shared visit with non-physician practitioner(s) and myself.  I personally evaluated the patient during the encounter.   EKG Interpretation None     Patient here with left-sided flank pain radiating to her left lower abdomen. Recently treated for urinary tract infection. Abdominal exam is without evidence of rebound or peritoneal signs. Will obtain CT scan  Toy BakerAnthony T Aalaya Yadao, MD 12/20/13 [phone]

## 2013-12-20 NOTE — Discharge Instructions (Signed)
zofran for nausea at home. Ibuprofen and percocet for pain. Cipro for antibiotic. Follow up with urologist in the office. Return if fever, persistent vomiting, worsening pain.    Kidney Stones Kidney stones (urolithiasis) are deposits that form inside your kidneys. The intense pain is caused by the stone moving through the urinary tract. When the stone moves, the ureter goes into spasm around the stone. The stone is usually passed in the urine.  CAUSES   A disorder that makes certain neck glands produce too much parathyroid hormone (primary hyperparathyroidism).  A buildup of uric acid crystals, similar to gout in your joints.  Narrowing (stricture) of the ureter.  A kidney obstruction present at birth (congenital obstruction).  Previous surgery on the kidney or ureters.  Numerous kidney infections. SYMPTOMS   Feeling sick to your stomach (nauseous).  Throwing up (vomiting).  Blood in the urine (hematuria).  Pain that usually spreads (radiates) to the groin.  Frequency or urgency of urination. DIAGNOSIS   Taking a history and physical exam.  Blood or urine tests.  CT scan.  Occasionally, an examination of the inside of the urinary bladder (cystoscopy) is performed. TREATMENT   Observation.  Increasing your fluid intake.  Extracorporeal shock wave lithotripsy--This is a noninvasive procedure that uses shock waves to break up kidney stones.  Surgery may be needed if you have severe pain or persistent obstruction. There are various surgical procedures. Most of the procedures are performed with the use of small instruments. Only small incisions are needed to accommodate these instruments, so recovery time is minimized. The size, location, and chemical composition are all important variables that will determine the proper choice of action for you. Talk to your health care provider to better understand your situation so that you will minimize the risk of injury to yourself  and your kidney.  HOME CARE INSTRUCTIONS   Drink enough water and fluids to keep your urine clear or pale yellow. This will help you to pass the stone or stone fragments.  Strain all urine through the provided strainer. Keep all particulate matter and stones for your health care provider to see. The stone causing the pain may be as small as a grain of salt. It is very important to use the strainer each and every time you pass your urine. The collection of your stone will allow your health care provider to analyze it and verify that a stone has actually passed. The stone analysis will often identify what you can do to reduce the incidence of recurrences.  Only take over-the-counter or prescription medicines for pain, discomfort, or fever as directed by your health care provider.  Make a follow-up appointment with your health care provider as directed.  Get follow-up X-rays if required. The absence of pain does not always mean that the stone has passed. It may have only stopped moving. If the urine remains completely obstructed, it can cause loss of kidney function or even complete destruction of the kidney. It is your responsibility to make sure X-rays and follow-ups are completed. Ultrasounds of the kidney can show blockages and the status of the kidney. Ultrasounds are not associated with any radiation and can be performed easily in a matter of minutes. SEEK MEDICAL CARE IF:  You experience pain that is progressive and unresponsive to any pain medicine you have been prescribed. SEEK IMMEDIATE MEDICAL CARE IF:   Pain cannot be controlled with the prescribed medicine.  You have a fever or shaking chills.  The severity or  intensity of pain increases over 18 hours and is not relieved by pain medicine.  You develop a new onset of abdominal pain.  You feel faint or pass out.  You are unable to urinate. MAKE SURE YOU:   Understand these instructions.  Will watch your condition.  Will get  help right away if you are not doing well or get worse. Document Released: 06/03/2005 Document Revised: 02/03/2013 Document Reviewed: 11/04/2012 [facility] Patient Information 2015 [place], [place]. This information is not intended to replace advice given to you by your health care provider. Make sure you discuss any questions you have with your health care provider.

## 2013-12-21 ENCOUNTER — Emergency Department (HOSPITAL_COMMUNITY)
Admission: EM | Admit: 2013-12-21 | Discharge: 2013-12-21 | Disposition: A | Discharge status: Home / Self Care | Payer: Medicare Other | Attending: Emergency Medicine | Admitting: Emergency Medicine

## 2013-12-21 ENCOUNTER — Encounter (HOSPITAL_COMMUNITY): Payer: Self-pay | Admitting: Emergency Medicine

## 2013-12-21 DIAGNOSIS — N23 Unspecified renal colic: Secondary | ICD-10-CM | POA: Insufficient documentation

## 2013-12-21 DIAGNOSIS — I10 Essential (primary) hypertension: Secondary | ICD-10-CM | POA: Insufficient documentation

## 2013-12-21 DIAGNOSIS — R011 Cardiac murmur, unspecified: Secondary | ICD-10-CM | POA: Insufficient documentation

## 2013-12-21 DIAGNOSIS — Z79899 Other long term (current) drug therapy: Secondary | ICD-10-CM | POA: Insufficient documentation

## 2013-12-21 DIAGNOSIS — Z792 Long term (current) use of antibiotics: Secondary | ICD-10-CM | POA: Insufficient documentation

## 2013-12-21 DIAGNOSIS — F17200 Nicotine dependence, unspecified, uncomplicated: Secondary | ICD-10-CM | POA: Insufficient documentation

## 2013-12-21 DIAGNOSIS — E669 Obesity, unspecified: Secondary | ICD-10-CM | POA: Insufficient documentation

## 2013-12-21 DIAGNOSIS — Z8739 Personal history of other diseases of the musculoskeletal system and connective tissue: Secondary | ICD-10-CM | POA: Insufficient documentation

## 2013-12-21 DIAGNOSIS — E785 Hyperlipidemia, unspecified: Secondary | ICD-10-CM | POA: Insufficient documentation

## 2013-12-21 DIAGNOSIS — Z7982 Long term (current) use of aspirin: Secondary | ICD-10-CM | POA: Insufficient documentation

## 2013-12-21 LAB — URINALYSIS, ROUTINE W REFLEX MICROSCOPIC
Bilirubin Urine: NEGATIVE
Glucose, UA: 100 mg/dL — AB
Ketones, ur: NEGATIVE mg/dL
NITRITE: NEGATIVE
Protein, ur: NEGATIVE mg/dL
SPECIFIC GRAVITY, URINE: 1.012 (ref 1.005–1.030)
Urobilinogen, UA: 0.2 mg/dL (ref 0.0–1.0)
pH: 6.5 (ref 5.0–8.0)

## 2013-12-21 LAB — URINE CULTURE
Colony Count: NO GROWTH
Culture: NO GROWTH

## 2013-12-21 LAB — URINE MICROSCOPIC-ADD ON

## 2013-12-21 MED ORDER — ONDANSETRON HCL 4 MG/2ML IJ SOLN
4.0000 mg | Freq: Once | INTRAMUSCULAR | Status: AC
  Administered 2013-12-21: 4 mg via INTRAVENOUS
  Filled 2013-12-21: qty 2

## 2013-12-21 MED ORDER — HYDROMORPHONE HCL PF 1 MG/ML IJ SOLN
1.0000 mg | Freq: Once | INTRAMUSCULAR | Status: AC
  Administered 2013-12-21: 1 mg via INTRAVENOUS
  Filled 2013-12-21: qty 1

## 2013-12-21 MED ORDER — SODIUM CHLORIDE 0.9 % IV SOLN
INTRAVENOUS | Status: DC
  Administered 2013-12-21: 05:00:00 via INTRAVENOUS

## 2013-12-21 NOTE — ED Notes (Signed)
Pt unable to void at this time. 

## 2013-12-21 NOTE — ED Provider Notes (Signed)
CSN: [id number]     Arrival date & time 12/21/13  [id number] History   First MD Initiated Contact with Patient 12/21/13 [phone]     Chief Complaint  Patient presents with  . Vomiting      (Consider location/radiation/quality/duration/timing/severity/associated sxs/prior Treatment) HPI This is a [age] year old female who was diagnosed here yesterday with a 5 mm left proximal ureteral stone. She was treated with IV medications and discharged home. She returns now with nausea and vomiting that began about 1 AM. This has prevented her from taking her pain medication and she is now having a return of severe pain in her left flank.  Past Medical History  Diagnosis Date  . Obesity   . HTN (hypertension)   . HLD (hyperlipidemia)   . DJD (degenerative joint disease)   . Heart murmur   . Tobacco abuse    Past Surgical History  Procedure Laterality Date  . Bilateral knee replacements    . Vesicovaginal fistula closure w/ tah    . Corneal transplant    . Abdominal hysterectomy     Family History  Problem Relation Age of Onset  . Hypertension Father   . Hypertension Brother    History  Substance Use Topics  . Smoking status: Current Some Day Smoker    Types: Cigarettes  . Smokeless tobacco: Never Used     Comment: 1/2 ppd   . Alcohol Use: Yes     Comment: glass of wine 3 times per week   OB History   Grav Para Term Preterm Abortions TAB SAB Ect Mult Living                 Review of Systems  All other systems reviewed and are negative.   Allergies  Bee venom  Home Medications   Prior to Admission medications   Medication Sig Start Date End Date Taking? Authorizing Provider  aspirin 81 MG EC tablet Take 81 mg by mouth daily.     Yes Historical Provider, MD  atorvastatin (LIPITOR) 40 MG tablet Take 1 tablet (40 mg total) by mouth daily. 04/13/13  Yes Pricilla RifflePaula Ross V, MD  Calcium Citrate (CAL-CITRATE PO) Take 1 tablet by mouth daily.    Yes Historical Provider, MD  ciprofloxacin (CIPRO)  500 MG tablet Take 1 tablet (500 mg total) by mouth 2 (two) times daily. 12/20/13  Yes Tatyana A Kirichenko, PA-C  cyanocobalamin 1000 MCG tablet Take 1,000 mcg by mouth daily.    Yes Historical Provider, MD  ergocalciferol (VITAMIN D2) 50000 UNITS capsule Take 50,000 Units by mouth 2 (two) times a week. Monday and Thursday   Yes Historical Provider, MD  metoprolol (LOPRESSOR) 50 MG tablet Take 50 mg by mouth 2 (two) times daily.   Yes Historical Provider, MD  ondansetron (ZOFRAN) 8 MG tablet Take 1 tablet (8 mg total) by mouth every 8 (eight) hours as needed for nausea or vomiting. 12/20/13  Yes Tatyana A Kirichenko, PA-C  oxyCODONE-acetaminophen (PERCOCET) 5-325 MG per tablet Take 1 tablet by mouth every 4 (four) hours as needed for severe pain. 12/20/13  Yes Tatyana A Kirichenko, PA-C  potassium chloride SA (K-DUR,KLOR-CON) 20 MEQ tablet Take 20 mEq by mouth daily.   Yes Historical Provider, MD  prednisoLONE sodium phosphate (INFLAMASE FORTE) 1 % ophthalmic solution Place 1 drop into the left eye 2 (two) times daily. UAD   Yes Historical Provider, MD  cephALEXin (KEFLEX) 500 MG capsule Take 1 capsule (500 mg total) by mouth 4 (four) times daily.  12/06/13   Elson AreasLeslie K Sofia, PA-C  EPINEPHrine [facility](EPIPEN JR) 0.15 MG/0.3ML injection Inject 0.15 mg into the muscle as needed.    Historical Provider, MD  nitroGLYCERIN (NITROSTAT) 0.4 MG SL tablet Place 1 tablet (0.4 mg total) under the tongue every 5 (five) minutes x 3 doses as needed for chest pain. 04/01/12   Ok Anishristopher R Berge, NP   BP 186/88  Pulse 65  Temp(Src) 97.9 F (36.6 C) (Oral)  Resp 20  SpO2 97%  Physical Exam General: Well-developed, well-nourished female in no acute distress; appearance consistent with age of record HENT: normocephalic; atraumatic Eyes: pupils equal, round and reactive to light; extraocular muscles intact Neck: supple Heart: regular rate and rhythm Lungs: clear to auscultation bilaterally Abdomen: soft; nondistended; mild  left upper quadrant tenderness; no masses or hepatosplenomegaly; bowel sounds present GU: No CVA tenderness Extremities: No deformity; full range of motion; pulses normal Neurologic: Awake, alert and oriented; motor function intact in all extremities and symmetric; no facial droop Skin: Warm and dry Psychiatric: Mildly agitated    ED Course  Procedures (including critical care time)  MDM  Nursing notes and vitals signs, including pulse oximetry, reviewed.  Summary of this visit's results, reviewed by myself:  Labs:  Results for orders placed during the hospital encounter of 12/21/13 (from the past 24 hour(s))  URINALYSIS, ROUTINE W REFLEX MICROSCOPIC     Status: Abnormal   Collection Time    12/21/13  6:00 AM      Result Value Ref Range   Color, Urine YELLOW  YELLOW   APPearance CLEAR  CLEAR   Specific Gravity, Urine 1.012  1.005 - 1.030   pH 6.5  5.0 - 8.0   Glucose, UA 100 (*) NEGATIVE mg/dL   Hgb urine dipstick LARGE (*) NEGATIVE   Bilirubin Urine NEGATIVE  NEGATIVE   Ketones, ur NEGATIVE  NEGATIVE mg/dL   Protein, ur NEGATIVE  NEGATIVE mg/dL   Urobilinogen, UA 0.2  0.0 - 1.0 mg/dL   Nitrite NEGATIVE  NEGATIVE   Leukocytes, UA SMALL (*) NEGATIVE  URINE MICROSCOPIC-ADD ON     Status: None   Collection Time    12/21/13  6:00 AM      Result Value Ref Range   Squamous Epithelial / LPF RARE  RARE   RBC / HPF 21-50  <3 RBC/hpf    Imaging Studies: Ct Abdomen Pelvis Wo Contrast  12/20/2013   CLINICAL DATA:  Left flank and medial abdominal pain. Recent treatment for urinary tract infection. Red cells and white cells in urine.  EXAM: CT ABDOMEN AND PELVIS WITHOUT CONTRAST  TECHNIQUE: Multidetector CT imaging of the abdomen and pelvis was performed following the standard protocol without IV contrast.  COMPARISON:  None.  FINDINGS: Mild dependent changes in the lung bases.  There is a 5 mm stone in the proximal left ureter at the ureteropelvic junction. There is pyelocaliectasis  with perirenal and periureteral stranding. The distal left ureter is decompressed. No stones or hydroureter on the right. Bladder wall is mildly thickened which could indicate infection. No bladder stones are visualized. Small cyst in the right kidney.  The unenhanced appearance of the liver, spleen, gallbladder, pancreas, adrenal glands, abdominal aorta, inferior vena cava, and retroperitoneal lymph nodes is unremarkable. The stomach, small bowel, and colon are decompressed. No free air or free fluid in the abdomen.  Pelvis: No pelvic mass or lymphadenopathy. No diverticulitis. Appendix is normal. No free or loculated pelvic fluid collections. No destructive bone lesions. Compression of  the superior endplate of L1 probably relating to Schmorl's node. Degenerative changes in the lower thoracic spine.  IMPRESSION: 5 mm stone in the proximal left ureter with moderate proximal obstruction. Bladder wall thickening may indicate infection.   Electronically Signed   By: Burman Nieves M.D.   On: 12/20/2013 05:21   5:42 AM Patient's pain and nausea significantly improved after IV Zofran and Dilaudid.  6:31 AM Patient resting comfortably. Advised to contact urology this morning for followup appointment.     Hanley Seamen, MD 12/21/13 [phone]

## 2013-12-21 NOTE — ED Provider Notes (Signed)
Medical screening examination/treatment/procedure(s) were performed by non-physician practitioner and as supervising physician I was immediately available for consultation/collaboration.   EKG Interpretation None       Toy BakerAnthony T Holle Sprick, MD 12/21/13 1006

## 2013-12-21 NOTE — Discharge Instructions (Signed)

## 2013-12-21 NOTE — ED Notes (Addendum)
Patient states she was diagnosed with a kidney stone yesterday 12/20/13. Patient c/o emesis tonight. Patient c/o R flank pain and severe nausea. States she has been taking her home pain medication but is unable to state most recent dose.

## 2013-12-22 NOTE — ED Provider Notes (Signed)
Medical screening examination/treatment/procedure(s) were performed by non-physician practitioner and as supervising physician I was immediately available for consultation/collaboration.   EKG Interpretation None        Aroldo Galli David Maridee Slape III, MD 12/22/13 1502 

## 2013-12-29 ENCOUNTER — Other Ambulatory Visit: Payer: Self-pay | Admitting: Urology

## 2014-01-03 ENCOUNTER — Encounter (HOSPITAL_BASED_OUTPATIENT_CLINIC_OR_DEPARTMENT_OTHER): Payer: Self-pay | Admitting: *Deleted

## 2014-01-03 NOTE — Progress Notes (Signed)
NPO AFTER MN. ARRIVE AT 0930. NEEDS ISTAT.  CURRENT EKG IN CHART AND EPIC. WILL TAKE METOPROLOL AM DOS AND IF NEEDED TAKE PAIN/ NAUSEA MED.

## 2014-01-07 ENCOUNTER — Encounter (HOSPITAL_BASED_OUTPATIENT_CLINIC_OR_DEPARTMENT_OTHER)
Admission: RE | Disposition: A | Discharge status: Home / Self Care | Payer: Self-pay | Source: Ambulatory Visit | Attending: Urology

## 2014-01-07 ENCOUNTER — Encounter (HOSPITAL_BASED_OUTPATIENT_CLINIC_OR_DEPARTMENT_OTHER): Payer: Medicare Other | Admitting: Anesthesiology

## 2014-01-07 ENCOUNTER — Ambulatory Visit (HOSPITAL_BASED_OUTPATIENT_CLINIC_OR_DEPARTMENT_OTHER)
Admission: RE | Admit: 2014-01-07 | Discharge: 2014-01-07 | Disposition: A | Discharge status: Home / Self Care | Payer: Medicare Other | Source: Ambulatory Visit | Attending: Urology | Admitting: Urology

## 2014-01-07 ENCOUNTER — Ambulatory Visit (HOSPITAL_BASED_OUTPATIENT_CLINIC_OR_DEPARTMENT_OTHER): Payer: Medicare Other | Admitting: Anesthesiology

## 2014-01-07 ENCOUNTER — Encounter (HOSPITAL_BASED_OUTPATIENT_CLINIC_OR_DEPARTMENT_OTHER): Payer: Self-pay | Admitting: Urology

## 2014-01-07 DIAGNOSIS — Z7982 Long term (current) use of aspirin: Secondary | ICD-10-CM | POA: Diagnosis not present

## 2014-01-07 DIAGNOSIS — E7800 Pure hypercholesterolemia, unspecified: Secondary | ICD-10-CM | POA: Insufficient documentation

## 2014-01-07 DIAGNOSIS — I10 Essential (primary) hypertension: Secondary | ICD-10-CM | POA: Diagnosis not present

## 2014-01-07 DIAGNOSIS — F17200 Nicotine dependence, unspecified, uncomplicated: Secondary | ICD-10-CM | POA: Diagnosis not present

## 2014-01-07 DIAGNOSIS — N200 Calculus of kidney: Secondary | ICD-10-CM

## 2014-01-07 DIAGNOSIS — Z79899 Other long term (current) drug therapy: Secondary | ICD-10-CM | POA: Diagnosis not present

## 2014-01-07 DIAGNOSIS — Z6838 Body mass index (BMI) 38.0-38.9, adult: Secondary | ICD-10-CM | POA: Diagnosis not present

## 2014-01-07 DIAGNOSIS — N201 Calculus of ureter: Secondary | ICD-10-CM | POA: Insufficient documentation

## 2014-01-07 HISTORY — PX: CYSTOSCOPY WITH URETEROSCOPY AND STENT PLACEMENT: SHX6377

## 2014-01-07 HISTORY — DX: Essential (primary) hypertension: I10

## 2014-01-07 HISTORY — DX: Unspecified osteoarthritis, unspecified site: M19.90

## 2014-01-07 HISTORY — PX: HOLMIUM LASER APPLICATION: SHX5852

## 2014-01-07 HISTORY — DX: Hyperlipidemia, unspecified: E78.5

## 2014-01-07 HISTORY — DX: Calculus of ureter: N20.1

## 2014-01-07 HISTORY — DX: Presence of spectacles and contact lenses: Z97.3

## 2014-01-07 HISTORY — DX: Presence of dental prosthetic device (complete) (partial): Z97.2

## 2014-01-07 LAB — POCT I-STAT 4, (NA,K, GLUC, HGB,HCT)
Glucose, Bld: 94 mg/dL (ref 70–99)
HEMATOCRIT: 44 % (ref 36.0–46.0)
Hemoglobin: 15 g/dL (ref 12.0–15.0)
Potassium: 4.1 mEq/L (ref 3.7–5.3)
Sodium: 139 mEq/L (ref 137–147)

## 2014-01-07 SURGERY — CYSTOURETEROSCOPY, WITH STENT INSERTION
Anesthesia: General | Site: Ureter | Laterality: Left

## 2014-01-07 MED ORDER — DEXAMETHASONE SODIUM PHOSPHATE 4 MG/ML IJ SOLN
INTRAMUSCULAR | Status: DC | PRN
  Administered 2014-01-07: 10 mg via INTRAVENOUS

## 2014-01-07 MED ORDER — ONDANSETRON HCL 4 MG/2ML IJ SOLN
INTRAMUSCULAR | Status: DC | PRN
  Administered 2014-01-07: 4 mg via INTRAVENOUS

## 2014-01-07 MED ORDER — IOHEXOL 350 MG/ML SOLN
INTRAVENOUS | Status: DC | PRN
Start: 2014-01-07 — End: 2014-01-07
  Administered 2014-01-07: 10 mL via INTRAVENOUS

## 2014-01-07 MED ORDER — CIPROFLOXACIN IN D5W 400 MG/200ML IV SOLN
400.0000 mg | INTRAVENOUS | Status: AC
  Administered 2014-01-07: 400 mg via INTRAVENOUS
  Filled 2014-01-07: qty 200

## 2014-01-07 MED ORDER — PHENAZOPYRIDINE HCL 200 MG PO TABS: 200.0000 mg | ORAL_TABLET | Freq: Three times a day (TID) | ORAL | Status: DC | PRN

## 2014-01-07 MED ORDER — FENTANYL CITRATE 0.05 MG/ML IJ SOLN
25.0000 ug | INTRAMUSCULAR | Status: DC | PRN
Start: 2014-01-07 — End: 2014-01-07
  Filled 2014-01-07: qty 1

## 2014-01-07 MED ORDER — PROMETHAZINE HCL 25 MG/ML IJ SOLN
6.2500 mg | INTRAMUSCULAR | Status: DC | PRN
  Filled 2014-01-07: qty 1

## 2014-01-07 MED ORDER — MIDAZOLAM HCL 2 MG/2ML IJ SOLN
INTRAMUSCULAR | Status: AC
  Filled 2014-01-07: qty 2

## 2014-01-07 MED ORDER — TROSPIUM CHLORIDE ER 60 MG PO CP24: 60.0000 mg | ORAL_CAPSULE | Freq: Every day | ORAL | Status: DC

## 2014-01-07 MED ORDER — BELLADONNA ALKALOIDS-OPIUM 16.2-60 MG RE SUPP
RECTAL | Status: DC | PRN
  Administered 2014-01-07: 1 via RECTAL

## 2014-01-07 MED ORDER — LIDOCAINE HCL (CARDIAC) 20 MG/ML IV SOLN
INTRAVENOUS | Status: DC | PRN
  Administered 2014-01-07: 100 mg via INTRAVENOUS

## 2014-01-07 MED ORDER — ACETAMINOPHEN 10 MG/ML IV SOLN
INTRAVENOUS | Status: DC | PRN
  Administered 2014-01-07: 1000 mg via INTRAVENOUS

## 2014-01-07 MED ORDER — LIDOCAINE HCL 2 % EX GEL
CUTANEOUS | Status: DC | PRN
  Administered 2014-01-07: 1 via URETHRAL

## 2014-01-07 MED ORDER — BELLADONNA ALKALOIDS-OPIUM 16.2-60 MG RE SUPP
RECTAL | Status: AC
  Filled 2014-01-07: qty 1

## 2014-01-07 MED ORDER — KETOROLAC TROMETHAMINE 30 MG/ML IJ SOLN
15.0000 mg | Freq: Once | INTRAMUSCULAR | Status: DC | PRN
  Filled 2014-01-07: qty 1

## 2014-01-07 MED ORDER — KETOROLAC TROMETHAMINE 30 MG/ML IJ SOLN
INTRAMUSCULAR | Status: DC | PRN
  Administered 2014-01-07: 30 mg via INTRAVENOUS

## 2014-01-07 MED ORDER — FENTANYL CITRATE 0.05 MG/ML IJ SOLN
INTRAMUSCULAR | Status: DC | PRN
  Administered 2014-01-07 (×2): 50 ug via INTRAVENOUS
  Administered 2014-01-07 (×2): 25 ug via INTRAVENOUS

## 2014-01-07 MED ORDER — LACTATED RINGERS IV SOLN
INTRAVENOUS | Status: DC
  Administered 2014-01-07: 10:00:00 via INTRAVENOUS
  Filled 2014-01-07: qty 1000

## 2014-01-07 MED ORDER — PROPOFOL 10 MG/ML IV BOLUS
INTRAVENOUS | Status: DC | PRN
  Administered 2014-01-07: 150 mg via INTRAVENOUS

## 2014-01-07 MED ORDER — SODIUM CHLORIDE 0.9 % IR SOLN
Status: DC | PRN
  Administered 2014-01-07: 6000 mL

## 2014-01-07 MED ORDER — FENTANYL CITRATE 0.05 MG/ML IJ SOLN
INTRAMUSCULAR | Status: AC
  Filled 2014-01-07: qty 4

## 2014-01-07 SURGICAL SUPPLY — 38 items
BAG DRAIN URO-CYSTO SKYTR STRL (DRAIN) ×3 IMPLANT
BAG DRN UROCATH (DRAIN) ×1
BASKET LASER NITINOL 1.9FR (BASKET) IMPLANT
BASKET STNLS GEMINI 4WIRE 3FR (BASKET) IMPLANT
BASKET STONE 1.7 NGAGE (UROLOGICAL SUPPLIES) ×3 IMPLANT
BASKET STONE NCOMPASS (UROLOGICAL SUPPLIES) ×3 IMPLANT
BASKET ZERO TIP NITINOL 2.4FR (BASKET) IMPLANT
BSKT STON RTRVL 120 1.9FR (BASKET)
BSKT STON RTRVL GEM 120X11 3FR (BASKET)
BSKT STON RTRVL ZERO TP 2.4FR (BASKET)
CANISTER SUCT LVC 12 LTR MEDI- (MISCELLANEOUS) ×3 IMPLANT
CATH CLEAR GEL 3F BACKSTOP (CATHETERS) ×3 IMPLANT
CATH URET 5FR 28IN OPEN ENDED (CATHETERS) ×3 IMPLANT
CATH URET DUAL LUMEN 6-10FR 50 (CATHETERS) IMPLANT
CLOTH BEACON ORANGE TIMEOUT ST (SAFETY) ×3 IMPLANT
DRAPE CAMERA CLOSED 9X96 (DRAPES) ×3 IMPLANT
FIBER LASER FLEXIVA 200 (UROLOGICAL SUPPLIES) IMPLANT
FIBER LASER FLEXIVA 365 (UROLOGICAL SUPPLIES) ×3 IMPLANT
GLOVE BIO SURGEON STRL SZ7.5 (GLOVE) ×3 IMPLANT
GLOVE INDICATOR 6.0 STRL GRN (GLOVE) ×3 IMPLANT
GLOVE SURG SS PI 6.0 STRL IVOR (GLOVE) ×3 IMPLANT
GOWN STRL REIN XL XLG (GOWN DISPOSABLE) IMPLANT
GOWN STRL REUS W/TWL LRG LVL3 (GOWN DISPOSABLE) ×3 IMPLANT
GOWN STRL REUS W/TWL XL LVL3 (GOWN DISPOSABLE) ×3 IMPLANT
GUIDEWIRE 0.038 PTFE COATED (WIRE) IMPLANT
GUIDEWIRE ANG ZIPWIRE 038X150 (WIRE) IMPLANT
GUIDEWIRE STR DUAL SENSOR (WIRE) ×6 IMPLANT
IV NS IRRIG 3000ML ARTHROMATIC (IV SOLUTION) ×6 IMPLANT
KIT BALLIN UROMAX 15FX10 (LABEL) IMPLANT
KIT BALLN UROMAX 15FX4 (MISCELLANEOUS) IMPLANT
KIT BALLN UROMAX 26 75X4 (MISCELLANEOUS)
NS IRRIG 500ML POUR BTL (IV SOLUTION) IMPLANT
PACK CYSTOSCOPY (CUSTOM PROCEDURE TRAY) ×3 IMPLANT
SET HIGH PRES BAL DIL (LABEL)
SHEATH ACCESS URETERAL 38CM (SHEATH) IMPLANT
SHEATH ACCESS URETERAL 54CM (SHEATH) IMPLANT
STENT URET 6FRX24 CONTOUR (STENTS) ×3 IMPLANT
TUBE FEEDING 8FR 16IN STR KANG (MISCELLANEOUS) IMPLANT

## 2014-01-07 NOTE — Discharge Instructions (Signed)
DISCHARGE INSTRUCTIONS FOR KIDNEY STONE/URETERAL STENT   MEDICATIONS:  1.  Resume all your other meds from home - except do not take any extra narcotic pain meds that you may have at home.  2. Trospium is to prevent bladder spasms and help reduce urinary frequency. 3. Pyridium is to help with the burning/stinging when you urinate.    ACTIVITY:  1. No strenuous activity x 1week  2. No driving while on narcotic pain medications  3. Drink plenty of water  4. Continue to walk at home - you can still get blood clots when you are at home, so keep active, but don't over do it.  5. May return to work/school tomorrow or when you feel ready   BATHING:  1. You can shower and we recommend daily showers  2. You have a string coming from your urethra: The stent string is attached to your ureteral stent. Do not pull on this.   SIGNS/SYMPTOMS TO CALL:  Please call us if you have a fever greater than 101.5, uncontrolled nausea/vomiting, uncontrolled pain, dizziness, unable to urinate, bloody urine, chest pain, shortness of breath, leg swelling, leg pain, redness around wound, drainage from wound, or any other concerns or questions.   You can reach us at [phone].   FOLLOW-UP:  1. You have an appointment for stent removal in 7-10 days.     Post Anesthesia Home Care Instructions  Activity: Get plenty of rest for the remainder of the day. A responsible adult should stay with you for 24 hours following the procedure.  For the next 24 hours, DO NOT: -Drive a car -Advertising copywriterperate machinery -Drink alcoholic beverages -Take any medication unless instructed by your physician -Make any legal decisions or sign important papers.  Meals: Start with liquid foods such as gelatin or soup. Progress to regular foods as tolerated. Avoid greasy, spicy, heavy foods. If nausea and/or vomiting occur, drink only clear liquids until the nausea and/or vomiting subsides. Call your physician if vomiting  continues.  Special Instructions/Symptoms: Your throat may feel dry or sore from the anesthesia or the breathing tube placed in your throat during surgery. If this causes discomfort, gargle with warm salt water. The discomfort should disappear within 24 hours.

## 2014-01-07 NOTE — Anesthesia Postprocedure Evaluation (Signed)
  Anesthesia Post-op Note  Patient: Kelsey [name]  Procedure(s) Performed: Procedure(s) (LRB): CYSTOSCOPY WITH LEFT RETRGRADE, URETEROSCOPY , AND LASER LITHOTRIPSY STONE EXTRACTION AND LEFT STENT PLACEMENT (Left) HOLMIUM LASER APPLICATION (Left)  Patient Location: PACU  Anesthesia Type: General  Level of Consciousness: awake and alert   Airway and Oxygen Therapy: Patient Spontanous Breathing  Post-op Pain: mild  Post-op Assessment: Post-op Vital signs reviewed, Patient's Cardiovascular Status Stable, Respiratory Function Stable, Patent Airway and No signs of Nausea or vomiting  Last Vitals:  Filed Vitals:   01/07/14 1330  BP: 126/60  Pulse: 64  Temp:   Resp: 16    Post-op Vital Signs: stable   Complications: No apparent anesthesia complications

## 2014-01-07 NOTE — Anesthesia Procedure Notes (Signed)
Procedure Name: LMA Insertion Date/Time: 01/07/2014 11:47 AM Performed by: Norva PavlovALLAWAY, Verna Desrocher G Pre-anesthesia Checklist: Patient identified, Emergency Drugs available, Suction available and Patient being monitored Patient Re-evaluated:Patient Re-evaluated prior to inductionOxygen Delivery Method: Circle System Utilized Preoxygenation: Pre-oxygenation with 100% oxygen Intubation Type: IV induction Ventilation: Mask ventilation without difficulty LMA: LMA inserted LMA Size: 4.0 Number of attempts: 1 Airway Equipment and Method: bite block Placement Confirmation: positive ETCO2 Tube secured with: Tape Dental Injury: Teeth and Oropharynx as per pre-operative assessment

## 2014-01-07 NOTE — Anesthesia Preprocedure Evaluation (Addendum)
Anesthesia Evaluation  Patient identified by MRN, date of birth, ID band Patient awake    Reviewed: Allergy & Precautions, H&P , NPO status , Patient's Chart, lab work & pertinent test results  Airway       Dental  (+) Upper Dentures, Partial Lower, Dental Advisory Given   Pulmonary Current Smoker,          Cardiovascular hypertension, Pt. on home beta blockers and Pt. on medications    Findings: SPECT imaging demonstrates no reversible or irreversible defects to suggest ischemia or infarct.   Quantitative gated analysis shows normal wall motion.   The resting left ventricular ejection fraction is 69% with end-  Aortic valve:   Structurally normal valve.   Cusp separation was normal.  Doppler:  Transvalvular velocity was within the normal range. There was no stenosis.  No regurgitation. The proximal septum is moderately thickened and the LV outflow tract is very narrow with mild SAM and a mild to moderate graident which worsens with Valsalva.  diastolic volume of 89 ml and end-systolic volume of 28 ml.   IMPRESSION: No evidence of ischemia or infarction.   Ejection fraction 69%.    Neuro/Psych negative neurological ROS  negative psych ROS   GI/Hepatic negative GI ROS, Neg liver ROS,   Endo/Other  Morbid obesity  Renal/GU negative Renal ROS  negative genitourinary   Musculoskeletal negative musculoskeletal ROS (+)   Abdominal   Peds negative pediatric ROS (+)  Hematology negative hematology ROS (+)   Anesthesia Other Findings   Reproductive/Obstetrics negative OB ROS                          Anesthesia Physical Anesthesia Plan  ASA: III  Anesthesia Plan: General   Post-op Pain Management:    Induction: Intravenous  Airway Management Planned: LMA  Additional Equipment:   Intra-op Plan:   Post-operative Plan: Extubation in OR  Informed Consent: I have reviewed the  patients History and Physical, chart, labs and discussed the procedure including the risks, benefits and alternatives for the proposed anesthesia with the patient or authorized representative who has indicated his/her understanding and acceptance.   Dental advisory given  Plan Discussed with: CRNA and Surgeon  Anesthesia Plan Comments:         Anesthesia Quick Evaluation

## 2014-01-07 NOTE — Op Note (Signed)
Preoperative diagnosis: left ureteral calculus  Postoperative diagnosis: left ureteral calculus  Procedure:  1. Cystoscopy 2. left ureteroscopy and stone removal 3. Ureteroscopic laser lithotripsy 4. left 33F x 24cm ureteral stent placement  5. left retrograde pyelography with interpretation  Surgeon: Crist FatBenjamin W. Herrick, MD  Anesthesia: General  Complications: None  Intraoperative findings: left retrograde pyelography demonstrated a filling defect within the left ureter consistent with the patient's known calculus without other abnormalities.  EBL: Minimal  Specimens: 1. left ureteral calculus  Disposition of specimens: [facility] for stone analysis  Indication: Kelsey [name] is a 68 y.o.   patient with urolithiasis. After reviewing the management options for treatment, the patient elected to proceed with the above surgical procedure(s). We have discussed the potential benefits and risks of the procedure, side effects of the proposed treatment, the likelihood of the patient achieving the goals of the procedure, and any potential problems that might occur during the procedure or recuperation. Informed consent has been obtained.  Description of procedure:  The patient was taken to the operating room and general anesthesia was induced.  The patient was placed in the dorsal lithotomy position, prepped and draped in the usual sterile fashion, and preoperative antibiotics were administered. A preoperative time-out was performed.   Cystourethroscopy was performed.  The patient's urethra was examined and was normal. The bladder was then systematically examined in its entirety. There was no evidence for any bladder tumors, stones, or other mucosal pathology.    Attention then turned to the left ureteral orifice and a ureteral catheter was used to intubate the ureteral orifice.  Omnipaque contrast was injected through the ureteral catheter and a retrograde pyelogram was  performed with findings as dictated above.  A 0.38 sensor guidewire was then advanced up the left ureter into the renal pelvis under fluoroscopic guidance. The 6 Fr semirigid ureteroscope was then advanced into the ureter next to the guidewire and the calculus was identified.   The stone was then fragmented with the 365 micron holmium laser fiber on a setting of 0.6 and frequency of 6 Hz.   All stones were then removed from the ureter with an N-gage nitinol basket.  Reinspection of the ureter revealed no remaining visible stones or fragments.   The wire was then backloaded through the cystoscope and a ureteral stent was advance over the wire using Seldinger technique.  The stent was positioned appropriately under fluoroscopic and cystoscopic guidance.  The wire was then removed with an adequate stent curl noted in the renal pelvis as well as in the bladder.  The bladder was then emptied and the procedure ended.  The patient appeared to tolerate the procedure well and without complications.  The patient was able to be awakened and transferred to the recovery unit in satisfactory condition.   Disposition: The patient will be scheduled for stent removal in 5-7 days in our clinic.

## 2014-01-07 NOTE — Transfer of Care (Signed)
Immediate Anesthesia Transfer of Care Note  Patient: Kelsey [name]  Procedure(s) Performed: Procedure(s): CYSTOSCOPY WITH LEFT RETRGRADE, URETEROSCOPY , AND LASER LITHOTRIPSY STONE EXTRACTION AND LEFT STENT PLACEMENT (Left) HOLMIUM LASER APPLICATION (Left)  Patient Location: PACU  Anesthesia Type:General  Level of Consciousness: awake and oriented  Airway & Oxygen Therapy: Patient Spontanous Breathing and Patient connected to nasal cannula oxygen  Post-op Assessment: Report given to PACU RN  Post vital signs: Reviewed and stable  Complications: No apparent anesthesia complications

## 2014-01-07 NOTE — Interval H&P Note (Signed)
History and Physical Interval Note: Patient doesn't think that she's passed her stone but ahs been asymptomatic.  We will proceed as planned.  No changes to the H&P. 01/07/2014 11:37 AM  Kelsey [name]  has presented today for surgery, with the diagnosis of LEFT URETERAL STONE  The various methods of treatment have been discussed with the patient and family. After consideration of risks, benefits and other options for treatment, the patient has consented to  Procedure(s): CYSTOSCOPY WITH LEFT URETEROSCOPY  AND LASER LITHOTRIPSY STONE EXTRACTION AND LEFT STENT PLACEMENT (Left) HOLMIUM LASER APPLICATION (Left) as a surgical intervention .  The patient's history has been reviewed, patient examined, no change in status, stable for surgery.  I have reviewed the patient's chart and labs.  Questions were answered to the patient's satisfaction.     Berniece SalinesHERRICK, BENJAMIN W

## 2014-01-07 NOTE — H&P (Signed)
Reason For Visit f/u for left proximal ureteral stone   History of Present Illness 80F found to have a left 5mm left proximal ureteral stone on 12/20/13. She has been on medical explusion therapy using Flomax and pain medication. She presents today for further evaluation. The patient had several days of no pain. Today, she describes less than 24 hours of left flank discomfort. This is not as severe as her initial pain. She denies any change to her voiding symptoms including frequency or urgency. She denies any fever/chills. She denies any dysuria.   Past Medical History Problems  1. History of arthritis (V13.4) 2. History of hypercholesterolemia (V12.29) 3. History of hypertension (V12.59) 4. History of Right ureteral calculus (592.1)  Surgical History Problems  1. History of Knee Replacement  Current Meds 1. Aspirin 81 MG Oral Tablet;  Therapy: (Recorded:07Jul2015) to Recorded 2. Cyanocobalamin 1000 MCG TABS;  Therapy: (Recorded:07Jul2015) to Recorded 3. EPINEPHrine 0.3 MG/0.3ML DEVI;  Therapy: (Recorded:07Jul2015) to Recorded 4. Inflamase Forte 1 % SOLN;  Therapy: (Recorded:07Jul2015) to Recorded 5. Klor-Con 10 TBCR;  Therapy: (Recorded:07Jul2015) to Recorded 6. Lipitor 40 MG Oral Tablet;  Therapy: (Recorded:07Jul2015) to Recorded 7. Lopressor 50 MG Oral Tablet;  Therapy: (Recorded:07Jul2015) to Recorded 8. Nitrostat 0.4 MG Sublingual Tablet Sublingual;  Therapy: (Recorded:07Jul2015) to Recorded 9. Vitamin D2 TABS;  Therapy: (Recorded:07Jul2015) to Recorded  Allergies Medication  1. No Known Drug Allergies Non-Medication  2. Bee sting  Family History Problems  1. No pertinent family history : Mother  Social History Problems  1. Alcohol use (V49.89) 2. Caffeine use (V49.89) 3. Current smoker on some days (305.1) 4. Four children 5. Retired 6. Widowed  Review of Systems No changes in pts bowel habits, neurological changes, or progressive lower urinary tract  symptoms.    Vitals Vital Signs [Data Includes: Last 1 Day]  Recorded: 14Jul2015 01:57PM  Blood Pressure: 148 / 79 Temperature: 97.4 F Heart Rate: 69  Physical Exam Constitutional: Well nourished and well developed . No acute distress.  ENT:. The ears and nose are normal in appearance.  Pulmonary: No respiratory distress, normal respiratory rhythm and effort and clear bilateral breath sounds.  Cardiovascular: Heart rate and rhythm are normal . The arterial pulses are normal. No peripheral edema.  Abdomen: The abdomen is soft and nontender. No masses are palpated. No CVA tenderness. No hernias are palpable. No hepatosplenomegaly noted.  Skin: Normal skin turgor, no visible rash and no visible skin lesions.  Neuro/Psych:. Mood and affect are appropriate.    Results/Data Urine [Data Includes: Last 1 Day]   14Jul2015  COLOR AMBER   APPEARANCE CLOUDY   SPECIFIC GRAVITY 1.025   pH 5.5   GLUCOSE NEG mg/dL  BILIRUBIN SMALL   KETONE NEG mg/dL  BLOOD LARGE   PROTEIN 30 mg/dL  UROBILINOGEN 0.2 mg/dL  NITRITE NEG   LEUKOCYTE ESTERASE NEG   SQUAMOUS EPITHELIAL/HPF FEW   WBC NONE SEEN WBC/hpf  RBC 21-50 RBC/hpf  BACTERIA FEW   CRYSTALS NONE SEEN   CASTS NONE SEEN    Patient had a KUB performed in clinic today to assess for stone location. The previously seen stone on CT scan in the left mid ureter is not visible on the current KUB. This may be because the stone is a uric acid stone or because of the patient's body habitus. There is a calcification in the left pelvis that is appears to be outside the collecting syst. Em   Patient's urinalysis today reveals microscopic hematuria, and inflammation. There is no  evidence of infection.   Assessment Assessed  1. History of Right ureteral calculus (592.1) 2. Left ureteral calculus (592.1)  Persistent left lower ureteral stone   Plan Health Maintenance  1. UA With REFLEX; [Do Not Release]; Status:Complete;   Done: 14Jul2015  01:36PM Left ureteral calculus  2. Start: Ondansetron HCl - 4 MG Oral Tablet (Zofran); TAKE 1 TABLET Every 8 hours PRN 3. Start: Oxycodone-Acetaminophen 5-325 MG Oral Tablet; TAKE 1 TO 2 TABLETS EVERY 4  TO 6 HOURS AS NEEDED FOR PAIN 4. Administer: Ketorolac Tromethamine 60 MG/2ML Intramuscular Solution; INJECT 60  MG  Intramuscular; To Be Done: 14Jul2015 5. Follow-up Schedule Surgery Office  Follow-up  Status: Hold For - Appointment   Requested for: 14Jul2015  Discussion/Summary I discussed treatment options for the stone with the patient including continued medical expulsion therapy and ureteroscopy. I told her I did not think shockwave lithotripsy was a good option given that it was not visible on KUB. The patient would like to continue with medical expulsion therapy, but she would also like to be scheduled for ureteroscopy next week in the event that she is not able to pass a stone in the interim. She will call and cancel surgery if she passes her stone. I refilled the patient's prescriptions. We will give her a shot of Toradol in the clinic today.

## 2014-01-10 ENCOUNTER — Encounter (HOSPITAL_BASED_OUTPATIENT_CLINIC_OR_DEPARTMENT_OTHER): Payer: Self-pay | Admitting: Urology

## 2014-01-11 ENCOUNTER — Encounter: Payer: Self-pay | Admitting: Internal Medicine

## 2014-02-01 ENCOUNTER — Ambulatory Visit: Payer: Medicare Other | Admitting: Physical Medicine & Rehabilitation

## 2014-02-04 ENCOUNTER — Encounter: Payer: Self-pay | Admitting: Internal Medicine

## 2014-02-04 ENCOUNTER — Encounter: Payer: Medicare Other | Attending: Physical Medicine & Rehabilitation | Admitting: Physical Medicine & Rehabilitation

## 2014-02-23 ENCOUNTER — Encounter: Payer: Self-pay | Admitting: Internal Medicine

## 2014-06-22 ENCOUNTER — Other Ambulatory Visit: Payer: Self-pay | Admitting: Physical Medicine & Rehabilitation

## 2014-07-15 ENCOUNTER — Telehealth: Payer: Self-pay | Admitting: *Deleted

## 2014-07-15 NOTE — Telephone Encounter (Signed)
Called patient and let her know that we cannot refill her pain meds unless she comes in for an appt.  Scheduled her for 08/16/14 with Dr. Riley KillSwartz

## 2014-07-15 NOTE — Telephone Encounter (Signed)
Asking to get a refill on her pain meds

## 2014-08-16 ENCOUNTER — Encounter: Payer: Self-pay | Admitting: Physical Medicine & Rehabilitation

## 2014-08-16 ENCOUNTER — Encounter: Payer: Medicare Other | Attending: Physical Medicine & Rehabilitation | Admitting: Physical Medicine & Rehabilitation

## 2014-08-16 VITALS — BP 131/68 | HR 69 | Resp 14

## 2014-08-16 DIAGNOSIS — M170 Bilateral primary osteoarthritis of knee: Secondary | ICD-10-CM | POA: Insufficient documentation

## 2014-08-16 MED ORDER — NAPROXEN 500 MG PO TABS: 500.0000 mg | ORAL_TABLET | Freq: Every day | ORAL | Status: DC

## 2014-08-16 NOTE — Progress Notes (Signed)
Subjective:    Patient ID: Kelsey [name], female    DOB: Jun 09, 1946, 69 y.o.   MRN: [id number]  HPI   Kelsey [name] is here in regards to her chronic knee pain. She has been doing failry well for the most part. She is using her naproxen on a prn basis only. She takes it about 3 x per week on avg. She has experienced no GI side effects. She continues on regular exercise, aquatic therapy. She is trying to lose weight also.     Pain Inventory Average Pain 5 Pain Right Now 5 My pain is aching  In the last 24 hours, has pain interfered with the following? General activity 6 Relation with others 5 Enjoyment of life 5 What TIME of day is your pain at its worst? anytime Sleep (in general) Fair  Pain is worse with: walking, bending, standing and some activites Pain improves with: heat/ice Relief from Meds: 5  Mobility use a cane how many minutes can you walk? 5 ability to climb steps?  no do you drive?  yes  Function retired  Neuro/Psych trouble walking  Prior Studies Any changes since last visit?  no  Physicians involved in your care Any changes since last visit?  no   Family History  Problem Relation Age of Onset  . Hypertension Father   . Hypertension Brother    History   Social History  . Marital Status: Legally Separated    Spouse Name: N/A  . Number of Children: N/A  . Years of Education: N/A   Social History Main Topics  . Smoking status: Current Some Day Smoker -- 0.50 packs/day    Types: Cigarettes  . Smokeless tobacco: Never Used     Comment: per pt occasional smoker  . Alcohol Use: 1.8 oz/week    3 Glasses of wine per week  . Drug Use: No  . Sexual Activity: Not on file   Other Topics Concern  . 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 Widowed - husband died Nov 16, 2011. Occasional smoking and Etoh.   Past Surgical History  Procedure Laterality Date  . 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  Total knee arthroplasty  left 03-30-2003/  right 11-05-2004    post op left knee I & D arthroscopic lavage 04-20-2003  . Revision total knee arthroplasty  left 08-01-2003/  right 02-20-02006  . Colonoscopy with esophagogastroduodenoscopy (egd)  09-21-2003  . Transthoracic echocardiogram  08-28-2011  dr Gunnar Fusi ross    moderate lvh/  ef 60-65%/  grade 2 diastolic dysfunction/ normal lv function / hyperdynamic lv with outflow murmur / turbulance through LVOT , mild SAM/ moderate lae/ moderate tr/  trivial mv & pv  . Cardiovascular stress test  03-31-2012    normal perfusion study/  no ischemia/  ef 69%  . Vaginal hysterectomy  age 89    w/ bilateral salpingoophorectomy and vesicovaginal fistula repair  . Cystoscopy with ureteroscopy and stent placement Left 01/07/2014    Procedure: CYSTOSCOPY WITH LEFT RETRGRADE, URETEROSCOPY , AND LASER LITHOTRIPSY STONE EXTRACTION AND LEFT STENT PLACEMENT;  Surgeon: Crist Fat, MD;  Location: [facility];  Service: Urology;  Laterality: Left;  . Holmium laser application Left 01/07/2014    Procedure: HOLMIUM LASER APPLICATION;  Surgeon: Crist Fat, MD;  Location: [facility];  Service: Urology;  Laterality: Left;   Past Medical History  Diagnosis Date  . Hypertension   .  Hyperlipidemia   . Left ureteral calculus   . Heart murmur     refer to cardiologist note from dr Dietrich Patespaula ross  . Arthritis   . Wears glasses   . Wears dentures     upper  and partial lower   BP 131/68 mmHg  Pulse 69  Resp 14  SpO2 97%  Opioid Risk Score:   Fall Risk Score: High Fall Risk (>13 points) (educated and given handout on fall prevention in the home) Review of Systems  Constitutional: Positive for fever, chills and unexpected weight change.  Respiratory: Positive for shortness of breath and wheezing.   Endocrine:       High blood sugar  Musculoskeletal: Positive for gait problem.  All other systems reviewed and are  negative.      Objective:   Physical Exam  General: Alert and oriented x 3, No apparent distress, morbidly obese  HEENT: Head is normocephalic, atraumatic, PERRLA, EOMI, sclera anicteric, oral mucosa pink and moist, dentition intact, ext ear canals clear,  Neck: Supple without JVD or lymphadenopathy  Heart: Reg rate and rhythm. No murmurs rubs or gallops  Chest: CTA bilaterally without wheezes, rales, or rhonchi; no distress  Abdomen: Soft, non-tender, non-distended, bowel sounds positive.  Extremities: No clubbing, cyanosis, or edema. Pulses are 2+  Skin: Clean and intact without signs of breakdown  Neuro: Pt is cognitively appropriate with normal insight, memory, and awareness. Cranial nerves 2-12 are intact. Sensory exam is normal. Reflexes are 2+ in all 4's. Fine motor coordination is intact. No tremors. Motor function is grossly 5/5 except with left knee extension and flexion which are 4/5.  Musculoskeletal: Both knees show significant scarring from prior surgeries. She has almost full knee ROM on the right. She lacks about 5 deg flexion right and 10 deg from 90 in flexion on the left. There is mild crepitus still on both legs. . She walks with minimal antalgia today.  No knee instability is seen. Psych: Pt's affect is appropriate. Pt is cooperative. She is very pleasant   Assessment & Plan:   1. 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 She is non-operative at this point due to poor bone density.  2. HTN     Plan:  1. Continue low dose naproxen 500mg  qd prn---this is working well for her. She is only using it perhaps 3 days per week. Discussed potential SE as well today.  2. Continue with HEP 4. Weight loss/ diet are also important.  5. Follow up with me prn. 15 minutes of face to face patient care time were spent during this visit. All questions were encouraged and  answered.

## 2014-08-16 NOTE — Patient Instructions (Signed)
CONTINUE TO WORK ON WEIGHT LOSS AND REGULAR EXERCISE. YOU ARE DOING A GREAT JOB!!!  PLEASE CALL ME WITH ANY PROBLEMS OR QUESTIONS [phone]).

## 2014-08-19 ENCOUNTER — Other Ambulatory Visit: Payer: Self-pay | Admitting: Internal Medicine

## 2014-09-20 ENCOUNTER — Encounter: Payer: Self-pay | Admitting: Internal Medicine

## 2014-10-03 ENCOUNTER — Telehealth: Payer: Self-pay | Admitting: *Deleted

## 2014-10-03 DIAGNOSIS — M170 Bilateral primary osteoarthritis of knee: Secondary | ICD-10-CM

## 2014-10-03 NOTE — Telephone Encounter (Signed)
Order FCE for patient's disability determination

## 2014-10-17 ENCOUNTER — Ambulatory Visit: Payer: Medicare Other

## 2015-08-17 NOTE — Progress Notes (Signed)
Cardiology Office Note   Date:  08/18/2015   ID:  Kelsey [name], Kelsey [name] 02/21/1946, MRN [id number]  PCP:  Caffie Damme, MD  Cardiologist:   Dietrich Pates, MD   Pt presents for f/u of CP and HTN     History of Present Illness: Kelsey [name] is a 70 y.o. female with a history of Chest pain  Neg myoview  Echo with dynamic outflow obstruction.  Also ha sithro of HTN I saw her in Oct 2014    Since seen she denies CP  Does not SOB with physical activity   About the same  This is rel stable  But she does note that she is slowing down  Less energy  This is different        Outpatient Prescriptions Prior to Visit  Medication Sig Dispense Refill  . aspirin 81 MG EC tablet Take 81 mg by mouth daily.      Marland Kitchen atorvastatin (LIPITOR) 40 MG tablet Take 40 mg by mouth every evening.    . Calcium Citrate (CAL-CITRATE PO) Take 1 tablet by mouth daily.     . cyanocobalamin 1000 MCG tablet Take 1,000 mcg by mouth daily.     Marland Kitchen EPINEPHrine (EPIPEN JR) 0.15 MG/0.3ML injection Inject 0.15 mg into the muscle as needed.    . ergocalciferol (VITAMIN D2) 50000 UNITS capsule Take 50,000 Units by mouth once a week. Wednesday    . metoprolol (LOPRESSOR) 50 MG tablet Take 50 mg by mouth 2 (two) times daily.    . naproxen (NAPROSYN) 500 MG tablet Take 1 tablet (500 mg total) by mouth daily before breakfast. 30 tablet 4  . omeprazole (PRILOSEC) 40 MG capsule Take 1 capsule by mouth daily.  3  . potassium chloride SA (K-DUR,KLOR-CON) 20 MEQ tablet Take 20 mEq by mouth daily.    . potassium chloride SA (KLOR-CON M20) 20 MEQ tablet Take 1 tablet (20 mEq total) by mouth daily. 30 tablet 0  . prednisoLONE sodium phosphate (INFLAMASE FORTE) 1 % ophthalmic solution Place 1 drop into the left eye as needed. UAD    . Trospium Chloride 60 MG CP24 Take 1 capsule (60 mg total) by mouth daily. 7 each 0   No facility-administered medications prior to visit.     Allergies:   Bee venom   Past Medical History  Diagnosis  Date  . Hypertension   . Hyperlipidemia   . Left ureteral calculus   . Heart murmur     refer to cardiologist note from dr Dietrich Pates  . Arthritis   . Wears glasses   . Wears dentures     upper  and partial lower    Past Surgical History  Procedure Laterality Date  . Corneal transplant Left 2002  . Total knee arthroplasty  left 03-30-2003/  right 11-05-2004    post op left knee I & D arthroscopic lavage 04-20-2003  . Revision total knee arthroplasty  left 08-01-2003/  right 02-20-02006  . Colonoscopy with esophagogastroduodenoscopy (egd)  09-21-2003  . Transthoracic echocardiogram  08-28-2011  dr Gunnar Fusi Ameera Tigue    moderate lvh/  ef 60-65%/  grade 2 diastolic dysfunction/ normal lv function / hyperdynamic lv with outflow murmur / turbulance through LVOT , mild SAM/ moderate lae/ moderate tr/  trivial mv & pv  . Cardiovascular stress test  03-31-2012    normal perfusion study/  no ischemia/  ef 69%  . Vaginal hysterectomy  age 67    w/ bilateral salpingoophorectomy and vesicovaginal  fistula repair  . Cystoscopy with ureteroscopy and stent placement Left 01/07/2014    Procedure: CYSTOSCOPY WITH LEFT RETRGRADE, URETEROSCOPY , AND LASER LITHOTRIPSY STONE EXTRACTION AND LEFT STENT PLACEMENT;  Surgeon: Crist Fat, MD;  Location: [facility];  Service: Urology;  Laterality: Left;  . Holmium laser application Left 01/07/2014    Procedure: HOLMIUM LASER APPLICATION;  Surgeon: Crist Fat, MD;  Location: [facility];  Service: Urology;  Laterality: Left;     Social History:  The patient  reports that she has been smoking Cigarettes.  She has been smoking about 0.50 packs per day. She has never used smokeless tobacco. She reports that she drinks about 1.8 oz of alcohol per week. She reports that she does not use illicit drugs.   Family History:  The patient's family history includes Hypertension in her brother and father.    ROS:  Please see the history  of present illness. All other systems are reviewed and  Negative to the above problem except as noted.    PHYSICAL EXAM: VS:  BP 108/78 mmHg  Pulse 68  Ht  (1.626 m)  Wt 226 lb 6.4 oz (102.694 kg)  BMI 38.84 kg/m2  GEN: Well nourished, well developed, in no acute distress HEENT: normal Neck: no JVD, carotid bruits, or masses Cardiac: RRR; Gr III/VI systolic murmur LSB  No , rubs, or gallops,no edema  Respiratory:  clear to auscultation bilaterally, normal work of breathing GI: soft, nontender, nondistended, + BS  No hepatomegaly  MS: no deformity Moving all extremities   Skin: warm and dry, no rash Neuro:  Strength and sensation are intact Psych: euthymic mood, full affect   EKG:  EKG is ordered today. SR 69 bpm  LVH     Lipid Panel    Component Value Date/Time   CHOL 242* 04/12/2013 1030   TRIG 98.0 04/12/2013 1030   HDL 89.70 04/12/2013 1030   CHOLHDL 3 04/12/2013 1030   VLDL 19.6 04/12/2013 1030   LDLCALC 63 04/01/2012 0829   LDLDIRECT 141.4 04/12/2013 1030      Wt Readings from Last 3 Encounters:  08/18/15 226 lb 6.4 oz (102.694 kg)  01/07/14 224 lb (101.606 kg)  10/06/13 236 lb 6.4 oz (107.23 kg)      ASSESSMENT AND PLAN:  1  SOB  May be related to dynamic outflow obstruction   Will review labs from Dr Katrinka Blazing Get echo Compare to previous   ? myoview  Further testing will depend on echo   2  HL  Continue statin  Get labs from Dr Katrinka Blazing  3  HTN  BP is good      Disposition:   FU will be based on test results  Tentative in 1 year at latest    Signed, Dietrich Pates, MD  08/18/2015 2:09 PM    [facility] [address] [place], [place], [place]  [address] Phone: [phone]; Fax: [phone]

## 2015-08-18 ENCOUNTER — Ambulatory Visit (INDEPENDENT_AMBULATORY_CARE_PROVIDER_SITE_OTHER): Payer: Medicare HMO | Admitting: Internal Medicine

## 2015-08-18 ENCOUNTER — Encounter: Payer: Self-pay | Admitting: Internal Medicine

## 2015-08-18 VITALS — BP 108/78 | HR 68 | Ht 64.0 in | Wt 226.4 lb

## 2015-08-18 DIAGNOSIS — R011 Cardiac murmur, unspecified: Secondary | ICD-10-CM

## 2015-08-18 DIAGNOSIS — R0600 Dyspnea, unspecified: Secondary | ICD-10-CM

## 2015-08-18 NOTE — Patient Instructions (Signed)

## 2015-08-22 ENCOUNTER — Other Ambulatory Visit: Payer: Self-pay

## 2015-08-22 ENCOUNTER — Ambulatory Visit (HOSPITAL_COMMUNITY): Payer: Medicare HMO | Attending: Cardiology

## 2015-08-22 DIAGNOSIS — I358 Other nonrheumatic aortic valve disorders: Secondary | ICD-10-CM | POA: Insufficient documentation

## 2015-08-22 DIAGNOSIS — I119 Hypertensive heart disease without heart failure: Secondary | ICD-10-CM | POA: Diagnosis not present

## 2015-08-22 DIAGNOSIS — R011 Cardiac murmur, unspecified: Secondary | ICD-10-CM | POA: Insufficient documentation

## 2015-08-22 DIAGNOSIS — I071 Rheumatic tricuspid insufficiency: Secondary | ICD-10-CM | POA: Diagnosis not present

## 2015-08-22 DIAGNOSIS — E669 Obesity, unspecified: Secondary | ICD-10-CM | POA: Insufficient documentation

## 2015-08-22 DIAGNOSIS — E785 Hyperlipidemia, unspecified: Secondary | ICD-10-CM | POA: Insufficient documentation

## 2015-08-22 DIAGNOSIS — Z720 Tobacco use: Secondary | ICD-10-CM | POA: Diagnosis not present

## 2015-08-22 DIAGNOSIS — Z6838 Body mass index (BMI) 38.0-38.9, adult: Secondary | ICD-10-CM | POA: Insufficient documentation

## 2015-08-22 DIAGNOSIS — R0600 Dyspnea, unspecified: Secondary | ICD-10-CM | POA: Diagnosis not present

## 2015-08-23 ENCOUNTER — Telehealth: Payer: Self-pay | Admitting: *Deleted

## 2015-08-23 DIAGNOSIS — I119 Hypertensive heart disease without heart failure: Secondary | ICD-10-CM

## 2015-08-23 DIAGNOSIS — I10 Essential (primary) hypertension: Secondary | ICD-10-CM

## 2015-08-23 NOTE — Telephone Encounter (Signed)
-----   Message from Dietrich PatesPaula Ross V, MD sent at 08/22/2015 10:05 PM EST ----- Pumping function of the heart is normal   Flow through the heart is a little more turbulent.(through LV outflow tract) than in 2013 Would check CBC and TSH to make sure OK

## 2015-08-23 NOTE — Telephone Encounter (Signed)
Notified the pt that per Dr Tenny Crawoss her echo showed that pumping function of the heart is normal, and flow through the heart is a little more turbulent than in 2013.  Informed the pt that per Dr Tenny Crawoss, she recommends that we check a CBC and TSH to make sure this is ok.  Scheduled the pts labs for this 08/25/15 at our office, as pt requested.  Pt verbalized understanding and agrees with this plan.

## 2015-08-25 ENCOUNTER — Telehealth: Payer: Self-pay | Admitting: Internal Medicine

## 2015-08-25 ENCOUNTER — Other Ambulatory Visit: Payer: Medicare HMO

## 2015-08-25 NOTE — Telephone Encounter (Signed)
Called patient.  Let her know Monday is fine; new lab appointment made.

## 2015-08-25 NOTE — Telephone Encounter (Signed)
Pt says she can not come for lab today,she can come Monday.

## 2015-08-28 ENCOUNTER — Other Ambulatory Visit (INDEPENDENT_AMBULATORY_CARE_PROVIDER_SITE_OTHER): Payer: Medicare HMO | Admitting: *Deleted

## 2015-08-28 DIAGNOSIS — I119 Hypertensive heart disease without heart failure: Secondary | ICD-10-CM

## 2015-08-28 DIAGNOSIS — I10 Essential (primary) hypertension: Secondary | ICD-10-CM

## 2015-08-28 LAB — CBC WITH DIFFERENTIAL/PLATELET
BASOS ABS: 0 10*3/uL (ref 0.0–0.1)
Basophils Relative: 0 % (ref 0–1)
EOS ABS: 0.2 10*3/uL (ref 0.0–0.7)
Eosinophils Relative: 3 % (ref 0–5)
HCT: 37.9 % (ref 36.0–46.0)
Hemoglobin: 12.4 g/dL (ref 12.0–15.0)
LYMPHS ABS: 3.2 10*3/uL (ref 0.7–4.0)
Lymphocytes Relative: 39 % (ref 12–46)
MCH: 28.3 pg (ref 26.0–34.0)
MCHC: 32.7 g/dL (ref 30.0–36.0)
MCV: 86.5 fL (ref 78.0–100.0)
MONOS PCT: 8 % (ref 3–12)
MPV: 11 fL (ref 8.6–12.4)
Monocytes Absolute: 0.6 10*3/uL (ref 0.1–1.0)
NEUTROS PCT: 50 % (ref 43–77)
Neutro Abs: 4.1 10*3/uL (ref 1.7–7.7)
PLATELETS: 296 10*3/uL (ref 150–400)
RBC: 4.38 MIL/uL (ref 3.87–5.11)
RDW: 13.7 % (ref 11.5–15.5)
WBC: 8.1 10*3/uL (ref 4.0–10.5)

## 2015-08-28 LAB — TSH: TSH: 0.73 mIU/L

## 2015-08-30 ENCOUNTER — Encounter: Payer: Self-pay | Admitting: Internal Medicine

## 2015-09-26 ENCOUNTER — Other Ambulatory Visit: Payer: Self-pay | Admitting: Physical Medicine & Rehabilitation

## 2015-11-27 ENCOUNTER — Emergency Department (HOSPITAL_COMMUNITY): Payer: Medicare HMO

## 2015-11-27 ENCOUNTER — Encounter (HOSPITAL_COMMUNITY): Payer: Self-pay | Admitting: Emergency Medicine

## 2015-11-27 ENCOUNTER — Emergency Department (HOSPITAL_COMMUNITY)
Admission: EM | Admit: 2015-11-27 | Discharge: 2015-11-27 | Disposition: A | Discharge status: Home / Self Care | Payer: Medicare HMO | Attending: Emergency Medicine | Admitting: Emergency Medicine

## 2015-11-27 DIAGNOSIS — W010XXA Fall on same level from slipping, tripping and stumbling without subsequent striking against object, initial encounter: Secondary | ICD-10-CM | POA: Insufficient documentation

## 2015-11-27 DIAGNOSIS — M1990 Unspecified osteoarthritis, unspecified site: Secondary | ICD-10-CM | POA: Diagnosis not present

## 2015-11-27 DIAGNOSIS — F17210 Nicotine dependence, cigarettes, uncomplicated: Secondary | ICD-10-CM | POA: Diagnosis not present

## 2015-11-27 DIAGNOSIS — M25572 Pain in left ankle and joints of left foot: Secondary | ICD-10-CM | POA: Diagnosis not present

## 2015-11-27 DIAGNOSIS — Y9311 Activity, swimming: Secondary | ICD-10-CM | POA: Insufficient documentation

## 2015-11-27 DIAGNOSIS — E785 Hyperlipidemia, unspecified: Secondary | ICD-10-CM | POA: Insufficient documentation

## 2015-11-27 DIAGNOSIS — Y9239 Other specified sports and athletic area as the place of occurrence of the external cause: Secondary | ICD-10-CM | POA: Diagnosis not present

## 2015-11-27 DIAGNOSIS — Z79899 Other long term (current) drug therapy: Secondary | ICD-10-CM | POA: Insufficient documentation

## 2015-11-27 DIAGNOSIS — Y999 Unspecified external cause status: Secondary | ICD-10-CM | POA: Diagnosis not present

## 2015-11-27 DIAGNOSIS — M545 Low back pain: Secondary | ICD-10-CM | POA: Diagnosis present

## 2015-11-27 DIAGNOSIS — Z7982 Long term (current) use of aspirin: Secondary | ICD-10-CM | POA: Diagnosis not present

## 2015-11-27 DIAGNOSIS — I10 Essential (primary) hypertension: Secondary | ICD-10-CM | POA: Insufficient documentation

## 2015-11-27 DIAGNOSIS — M25562 Pain in left knee: Secondary | ICD-10-CM | POA: Diagnosis not present

## 2015-11-27 NOTE — ED Notes (Signed)
Per EMS (and pt): [facility]  Got out of the pool and was headed back to the locker room when she slipped on some water.  Pt denies LOC.  C/o lumbar back pain, lt ankle pain with swelling, lt side pain.

## 2015-11-27 NOTE — ED Notes (Signed)
Patient transported to X-ray 

## 2015-11-27 NOTE — ED Notes (Signed)
Call has been placed to Ortho tech.

## 2015-11-27 NOTE — Progress Notes (Signed)
[facility].  Patient discharged today with crutches, now requesting walker.  EDCM spoke to EDPA Tresa EndoKelly who has seen this patient today.  Tried putting order in for a walker however, patient has been discharged longer than 2 hours and unable to place order in for walker.  EDCM called and spoke to patient and advised to call her pcp to ask for prescription for walker.  [facility] or pay for a walker out of pocket.  Patient thankful for assistance.  No further EDCM needs at this time.
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
## 2016-06-27 ENCOUNTER — Ambulatory Visit: Payer: Medicare HMO | Admitting: Internal Medicine

## 2016-07-01 ENCOUNTER — Other Ambulatory Visit: Payer: Self-pay | Admitting: Physical Medicine & Rehabilitation

## 2016-07-01 ENCOUNTER — Ambulatory Visit: Payer: Medicare HMO | Admitting: Internal Medicine

## 2016-07-01 DIAGNOSIS — M170 Bilateral primary osteoarthritis of knee: Secondary | ICD-10-CM

## 2016-07-01 NOTE — Telephone Encounter (Signed)
Patient requesting naprosyn 500mg , has not been seen in clinic since march of 2016, no future appointments made with us, please advise.

## 2016-08-02 DIAGNOSIS — H2511 Age-related nuclear cataract, right eye: Secondary | ICD-10-CM | POA: Diagnosis not present

## 2016-08-02 DIAGNOSIS — Z947 Corneal transplant status: Secondary | ICD-10-CM | POA: Diagnosis not present

## 2016-08-02 DIAGNOSIS — H4032X2 Glaucoma secondary to eye trauma, left eye, moderate stage: Secondary | ICD-10-CM | POA: Diagnosis not present

## 2016-08-02 DIAGNOSIS — H2702 Aphakia, left eye: Secondary | ICD-10-CM | POA: Diagnosis not present

## 2016-08-12 ENCOUNTER — Ambulatory Visit: Payer: PPO | Admitting: Internal Medicine

## 2016-08-23 ENCOUNTER — Ambulatory Visit (INDEPENDENT_AMBULATORY_CARE_PROVIDER_SITE_OTHER): Payer: PPO | Admitting: Internal Medicine

## 2016-08-23 ENCOUNTER — Encounter: Payer: Self-pay | Admitting: Internal Medicine

## 2016-08-23 ENCOUNTER — Encounter (INDEPENDENT_AMBULATORY_CARE_PROVIDER_SITE_OTHER): Payer: Self-pay

## 2016-08-23 VITALS — BP 136/78 | HR 61 | Ht 64.0 in | Wt 239.6 lb

## 2016-08-23 DIAGNOSIS — R011 Cardiac murmur, unspecified: Secondary | ICD-10-CM | POA: Diagnosis not present

## 2016-08-23 DIAGNOSIS — R0602 Shortness of breath: Secondary | ICD-10-CM | POA: Diagnosis not present

## 2016-08-23 NOTE — Patient Instructions (Signed)
Medication Instructions:    Your physician recommends that you continue on your current medications as directed. Please refer to the Current Medication list given to you today.  --- If you need a refill on your cardiac medications before your next appointment, please call your pharmacy. ---  Labwork:  None ordered  Testing/Procedures: Your physician has requested that you have an echocardiogram. Echocardiography is a painless test that uses sound waves to create images of your heart. It provides your doctor with information about the size and shape of your heart and how well your heart's chambers and valves are working. This procedure takes approximately one hour. There are no restrictions for this procedure.  Follow-Up:  Your physician wants you to follow-up in: December 2018/January 2019 with Dr. Tenny Crawoss. You will receive a reminder letter in the mail two months in advance. If you don't receive a letter, please call our office to schedule the follow-up appointment.  Thank you for choosing [facility]!!

## 2016-08-23 NOTE — Progress Notes (Signed)
Cardiology Office Note   Date:  08/23/2016   ID:  Sherril CongCarolyn J Meaney, DOB 03-Jun-1946, MRN [id number]  PCP:  Caffie DammeSMITH, KARLA, MD  Cardiologist:   Dietrich PatesPaula Kaisey Huseby, MD   F/U of SOB      History of Present Illness: Kelsey [name] is a 71 y.o. female with a history of chest pain  Neg myovue  Echo with dynamic outflow obstruction  Pt also with a history of HTN  I saw her in March 2017   Echo showed LVEF was vigorous    Sometimes if walks too far gets sob  When she vacuums she gets SOB   NO PND   No dizziness No Syncope No CP      Current Meds  Medication Sig  . aspirin 81 MG EC tablet Take 81 mg by mouth daily.    Marland Kitchen. atorvastatin (LIPITOR) 40 MG tablet Take 40 mg by mouth every evening.  . Calcium Citrate (CAL-CITRATE PO) Take 1 tablet by mouth daily.   . cyanocobalamin 1000 MCG tablet Take 1,000 mcg by mouth daily.   Marland Kitchen. EPINEPHrine (EPIPEN JR) 0.15 MG/0.3ML injection Inject 0.15 mg into the muscle as needed.  . ergocalciferol (VITAMIN D2) 50000 UNITS capsule Take 50,000 Units by mouth once a week. Wednesday  . metoprolol (LOPRESSOR) 50 MG tablet Take 50 mg by mouth 2 (two) times daily.  . naproxen (NAPROSYN) 500 MG tablet TAKE 1 TABLET BY MOUTH DAILY BEFORE BREAKFAST  . omeprazole (PRILOSEC) 40 MG capsule Take 1 capsule by mouth daily.  . potassium chloride SA (KLOR-CON M20) 20 MEQ tablet Take 1 tablet (20 mEq total) by mouth daily.  . prednisoLONE sodium phosphate (INFLAMASE FORTE) 1 % ophthalmic solution Place 1 drop into the left eye as needed. UAD  . Trospium Chloride 60 MG CP24 Take 1 capsule (60 mg total) by mouth daily.     Allergies:   Bee venom and Oxycodone-acetaminophen   Past Medical History:  Diagnosis Date  . Arthritis   . Heart murmur    refer to cardiologist note from dr Dietrich Patespaula Ainhoa Rallo  . Hyperlipidemia   . Hypertension   . Left ureteral calculus   . Wears dentures    upper  and partial lower  . Wears glasses     Past Surgical History:  Procedure Laterality  Date  . CARDIOVASCULAR STRESS TEST  03-31-2012   normal perfusion study/  no ischemia/  ef 69%  . COLONOSCOPY WITH ESOPHAGOGASTRODUODENOSCOPY (EGD)  09-21-2003  . CORNEAL TRANSPLANT Left 2002  . CYSTOSCOPY WITH URETEROSCOPY AND STENT PLACEMENT Left 01/07/2014   Procedure: CYSTOSCOPY WITH LEFT RETRGRADE, URETEROSCOPY , AND LASER LITHOTRIPSY STONE EXTRACTION AND LEFT STENT PLACEMENT;  Surgeon: Crist FatBenjamin W Herrick, MD;  [facility] [place];  Service: Urology;  Laterality: Left;  . HOLMIUM LASER APPLICATION Left 01/07/2014   Procedure: HOLMIUM LASER APPLICATION;  Surgeon: Crist FatBenjamin W Herrick, MD;  [facility] [place];  Service: Urology;  Laterality: Left;  . REVISION TOTAL KNEE ARTHROPLASTY  left 08-01-2003/  right 02-20-02006  . TOTAL KNEE ARTHROPLASTY  left 03-30-2003/  right 11-05-2004   post op left knee I & D arthroscopic lavage 04-20-2003  . TRANSTHORACIC ECHOCARDIOGRAM  08-28-2011  dr Gunnar Fusipaula Zoriah Pulice   moderate lvh/  ef 60-65%/  grade 2 diastolic dysfunction/ normal lv function / hyperdynamic lv with outflow murmur / turbulance through LVOT , mild SAM/ moderate lae/ moderate tr/  trivial mv & pv  . VAGINAL HYSTERECTOMY  age 71   w/  bilateral salpingoophorectomy and vesicovaginal fistula repair     Social History:  The patient  reports that she has been smoking Cigarettes.  She has been smoking about 0.50 packs per day. She has never used smokeless tobacco. She reports that she drinks about 1.8 oz of alcohol per week . She reports that she does not use drugs.   Family History:  The patient's family history includes Hypertension in her brother and father.    ROS:  Please see the history of present illness. All other systems are reviewed and  Negative to the above problem except as noted.    PHYSICAL EXAM: VS:  BP 136/78   Pulse 61   Ht 5\' 4"  (1.626 m)   Wt 239 lb 9.6 oz (108.7 kg)   BMI 41.13 kg/m   GEN: Well nourished, well developed, in no acute distress    HEENT: normal  Neck: no JVD, carotid bruits, or masses Cardiac: RRR;   GrIII/VI systolic murmur base  Gr II/VI at apex  No  rubs, or gallops,no edema  Respiratory:  clear to auscultation bilaterally, normal work of breathing GI: soft, nontender, nondistended, + BS  No hepatomegaly  MS: no deformity Moving all extremities   Skin: warm and dry, no rash Neuro:  Strength and sensation are intact Psych: euthymic mood, full affect   EKG:  EKG is ordered today.  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     F/U next winter     Current medicines are reviewed at length with the patient today.  The patient does not have concerns regarding medicines.  Signed, Dietrich Pates, MD  08/23/2016 3:19 PM    [facility] [address] [place], [place], [place]  [address] Phone: [phone]; Fax: [phone]

## 2016-09-11 ENCOUNTER — Other Ambulatory Visit: Payer: Self-pay

## 2016-09-11 ENCOUNTER — Ambulatory Visit (HOSPITAL_COMMUNITY): Payer: PPO | Attending: Cardiovascular Disease

## 2016-09-11 DIAGNOSIS — I422 Other hypertrophic cardiomyopathy: Secondary | ICD-10-CM | POA: Insufficient documentation

## 2016-09-11 DIAGNOSIS — I350 Nonrheumatic aortic (valve) stenosis: Secondary | ICD-10-CM | POA: Insufficient documentation

## 2016-09-11 DIAGNOSIS — R011 Cardiac murmur, unspecified: Secondary | ICD-10-CM

## 2016-09-11 DIAGNOSIS — R0602 Shortness of breath: Secondary | ICD-10-CM | POA: Diagnosis not present

## 2016-09-17 DIAGNOSIS — Z113 Encounter for screening for infections with a predominantly sexual mode of transmission: Secondary | ICD-10-CM | POA: Diagnosis not present

## 2016-09-17 DIAGNOSIS — Z79899 Other long term (current) drug therapy: Secondary | ICD-10-CM | POA: Diagnosis not present

## 2016-09-17 DIAGNOSIS — Z0000 Encounter for general adult medical examination without abnormal findings: Secondary | ICD-10-CM | POA: Diagnosis not present

## 2016-09-17 DIAGNOSIS — I10 Essential (primary) hypertension: Secondary | ICD-10-CM | POA: Diagnosis not present

## 2016-09-17 DIAGNOSIS — Z87891 Personal history of nicotine dependence: Secondary | ICD-10-CM | POA: Diagnosis not present

## 2016-09-17 DIAGNOSIS — E782 Mixed hyperlipidemia: Secondary | ICD-10-CM | POA: Diagnosis not present

## 2016-09-17 DIAGNOSIS — F17200 Nicotine dependence, unspecified, uncomplicated: Secondary | ICD-10-CM | POA: Diagnosis not present

## 2016-09-17 DIAGNOSIS — D539 Nutritional anemia, unspecified: Secondary | ICD-10-CM | POA: Diagnosis not present

## 2016-09-17 DIAGNOSIS — R0602 Shortness of breath: Secondary | ICD-10-CM | POA: Diagnosis not present

## 2016-09-17 DIAGNOSIS — R7303 Prediabetes: Secondary | ICD-10-CM | POA: Diagnosis not present

## 2016-09-17 DIAGNOSIS — E559 Vitamin D deficiency, unspecified: Secondary | ICD-10-CM | POA: Diagnosis not present

## 2016-10-07 DIAGNOSIS — Z1231 Encounter for screening mammogram for malignant neoplasm of breast: Secondary | ICD-10-CM | POA: Diagnosis not present

## 2017-01-28 DIAGNOSIS — Z87891 Personal history of nicotine dependence: Secondary | ICD-10-CM | POA: Diagnosis not present

## 2017-01-28 DIAGNOSIS — R7303 Prediabetes: Secondary | ICD-10-CM | POA: Diagnosis not present

## 2017-01-28 DIAGNOSIS — I10 Essential (primary) hypertension: Secondary | ICD-10-CM | POA: Diagnosis not present

## 2017-01-28 DIAGNOSIS — E559 Vitamin D deficiency, unspecified: Secondary | ICD-10-CM | POA: Diagnosis not present

## 2017-01-28 DIAGNOSIS — Z79899 Other long term (current) drug therapy: Secondary | ICD-10-CM | POA: Diagnosis not present

## 2017-01-28 DIAGNOSIS — F17200 Nicotine dependence, unspecified, uncomplicated: Secondary | ICD-10-CM | POA: Diagnosis not present

## 2017-01-28 DIAGNOSIS — D539 Nutritional anemia, unspecified: Secondary | ICD-10-CM | POA: Diagnosis not present

## 2017-01-28 DIAGNOSIS — E782 Mixed hyperlipidemia: Secondary | ICD-10-CM | POA: Diagnosis not present

## 2017-03-28 DIAGNOSIS — N201 Calculus of ureter: Secondary | ICD-10-CM | POA: Diagnosis not present

## 2017-04-03 DIAGNOSIS — R7303 Prediabetes: Secondary | ICD-10-CM | POA: Diagnosis not present

## 2017-04-03 DIAGNOSIS — E782 Mixed hyperlipidemia: Secondary | ICD-10-CM | POA: Diagnosis not present

## 2017-04-03 DIAGNOSIS — I10 Essential (primary) hypertension: Secondary | ICD-10-CM | POA: Diagnosis not present

## 2017-04-03 DIAGNOSIS — Z79899 Other long term (current) drug therapy: Secondary | ICD-10-CM | POA: Diagnosis not present

## 2017-04-03 DIAGNOSIS — Z23 Encounter for immunization: Secondary | ICD-10-CM | POA: Diagnosis not present

## 2017-04-03 DIAGNOSIS — Z716 Tobacco abuse counseling: Secondary | ICD-10-CM | POA: Diagnosis not present

## 2017-04-03 DIAGNOSIS — K760 Fatty (change of) liver, not elsewhere classified: Secondary | ICD-10-CM | POA: Diagnosis not present

## 2017-04-07 DIAGNOSIS — H4032X2 Glaucoma secondary to eye trauma, left eye, moderate stage: Secondary | ICD-10-CM | POA: Diagnosis not present

## 2017-04-21 DIAGNOSIS — Z947 Corneal transplant status: Secondary | ICD-10-CM | POA: Diagnosis not present

## 2017-04-21 DIAGNOSIS — S0522XA Ocular laceration and rupture with prolapse or loss of intraocular tissue, left eye, initial encounter: Secondary | ICD-10-CM | POA: Diagnosis not present

## 2017-04-21 DIAGNOSIS — H5712 Ocular pain, left eye: Secondary | ICD-10-CM | POA: Diagnosis not present

## 2017-04-21 DIAGNOSIS — S0592XA Unspecified injury of left eye and orbit, initial encounter: Secondary | ICD-10-CM | POA: Diagnosis not present

## 2017-04-21 DIAGNOSIS — H5442A4 Blindness left eye category 4, normal vision right eye: Secondary | ICD-10-CM | POA: Diagnosis not present

## 2017-04-21 DIAGNOSIS — H53142 Visual discomfort, left eye: Secondary | ICD-10-CM | POA: Diagnosis not present

## 2017-04-21 DIAGNOSIS — Z8679 Personal history of other diseases of the circulatory system: Secondary | ICD-10-CM | POA: Diagnosis not present

## 2017-04-21 DIAGNOSIS — W228XXA Striking against or struck by other objects, initial encounter: Secondary | ICD-10-CM | POA: Diagnosis not present

## 2017-04-21 DIAGNOSIS — S0532XA Ocular laceration without prolapse or loss of intraocular tissue, left eye, initial encounter: Secondary | ICD-10-CM | POA: Diagnosis not present

## 2017-04-21 DIAGNOSIS — Z862 Personal history of diseases of the blood and blood-forming organs and certain disorders involving the immune mechanism: Secondary | ICD-10-CM | POA: Diagnosis not present

## 2017-04-22 DIAGNOSIS — S0522XA Ocular laceration and rupture with prolapse or loss of intraocular tissue, left eye, initial encounter: Secondary | ICD-10-CM | POA: Diagnosis not present

## 2017-04-30 DIAGNOSIS — S0532XD Ocular laceration without prolapse or loss of intraocular tissue, left eye, subsequent encounter: Secondary | ICD-10-CM | POA: Diagnosis not present

## 2017-05-14 DIAGNOSIS — Z947 Corneal transplant status: Secondary | ICD-10-CM | POA: Diagnosis not present

## 2017-05-14 DIAGNOSIS — H4052X2 Glaucoma secondary to other eye disorders, left eye, moderate stage: Secondary | ICD-10-CM | POA: Diagnosis not present

## 2017-05-14 DIAGNOSIS — H16103 Unspecified superficial keratitis, bilateral: Secondary | ICD-10-CM | POA: Diagnosis not present

## 2017-05-14 DIAGNOSIS — H04123 Dry eye syndrome of bilateral lacrimal glands: Secondary | ICD-10-CM | POA: Diagnosis not present

## 2017-05-15 DIAGNOSIS — I10 Essential (primary) hypertension: Secondary | ICD-10-CM | POA: Diagnosis not present

## 2017-05-15 DIAGNOSIS — E782 Mixed hyperlipidemia: Secondary | ICD-10-CM | POA: Diagnosis not present

## 2017-05-15 DIAGNOSIS — Z716 Tobacco abuse counseling: Secondary | ICD-10-CM | POA: Diagnosis not present

## 2017-05-15 DIAGNOSIS — D539 Nutritional anemia, unspecified: Secondary | ICD-10-CM | POA: Diagnosis not present

## 2017-05-15 DIAGNOSIS — R7303 Prediabetes: Secondary | ICD-10-CM | POA: Diagnosis not present

## 2017-05-15 DIAGNOSIS — E559 Vitamin D deficiency, unspecified: Secondary | ICD-10-CM | POA: Diagnosis not present

## 2017-05-15 DIAGNOSIS — Z79899 Other long term (current) drug therapy: Secondary | ICD-10-CM | POA: Diagnosis not present

## 2017-05-15 DIAGNOSIS — M47816 Spondylosis without myelopathy or radiculopathy, lumbar region: Secondary | ICD-10-CM | POA: Diagnosis not present

## 2017-06-02 DIAGNOSIS — K760 Fatty (change of) liver, not elsewhere classified: Secondary | ICD-10-CM | POA: Diagnosis not present

## 2017-06-13 DIAGNOSIS — I10 Essential (primary) hypertension: Secondary | ICD-10-CM | POA: Diagnosis not present

## 2017-06-13 DIAGNOSIS — E782 Mixed hyperlipidemia: Secondary | ICD-10-CM | POA: Diagnosis not present

## 2017-06-13 DIAGNOSIS — F17200 Nicotine dependence, unspecified, uncomplicated: Secondary | ICD-10-CM | POA: Diagnosis not present

## 2017-06-13 DIAGNOSIS — M1990 Unspecified osteoarthritis, unspecified site: Secondary | ICD-10-CM | POA: Diagnosis not present

## 2017-06-13 DIAGNOSIS — Z79899 Other long term (current) drug therapy: Secondary | ICD-10-CM | POA: Diagnosis not present

## 2017-06-13 DIAGNOSIS — M47816 Spondylosis without myelopathy or radiculopathy, lumbar region: Secondary | ICD-10-CM | POA: Diagnosis not present

## 2017-06-18 DIAGNOSIS — R635 Abnormal weight gain: Secondary | ICD-10-CM | POA: Diagnosis not present

## 2017-06-18 DIAGNOSIS — N951 Menopausal and female climacteric states: Secondary | ICD-10-CM | POA: Diagnosis not present

## 2017-06-19 ENCOUNTER — Other Ambulatory Visit: Payer: Self-pay

## 2017-06-19 ENCOUNTER — Encounter (HOSPITAL_COMMUNITY): Payer: Self-pay | Admitting: Emergency Medicine

## 2017-06-19 ENCOUNTER — Ambulatory Visit (INDEPENDENT_AMBULATORY_CARE_PROVIDER_SITE_OTHER): Payer: Self-pay

## 2017-06-19 ENCOUNTER — Ambulatory Visit (HOSPITAL_COMMUNITY)
Admission: EM | Admit: 2017-06-19 | Discharge: 2017-06-19 | Disposition: A | Discharge status: Home / Self Care | Payer: Self-pay | Attending: Family Medicine | Admitting: Family Medicine

## 2017-06-19 DIAGNOSIS — M25262 Flail joint, left knee: Secondary | ICD-10-CM

## 2017-06-19 DIAGNOSIS — M25562 Pain in left knee: Secondary | ICD-10-CM | POA: Diagnosis not present

## 2017-06-19 DIAGNOSIS — M25512 Pain in left shoulder: Secondary | ICD-10-CM

## 2017-06-19 DIAGNOSIS — S4992XA Unspecified injury of left shoulder and upper arm, initial encounter: Secondary | ICD-10-CM | POA: Diagnosis not present

## 2017-06-19 DIAGNOSIS — S8992XA Unspecified injury of left lower leg, initial encounter: Secondary | ICD-10-CM | POA: Diagnosis not present

## 2017-06-19 MED ORDER — NAPROXEN 500 MG PO TABS: 500.0000 mg | ORAL_TABLET | Freq: Two times a day (BID) | ORAL | 0 refills | Status: AC
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
## 2017-06-24 DIAGNOSIS — R52 Pain, unspecified: Secondary | ICD-10-CM | POA: Diagnosis not present

## 2017-06-24 DIAGNOSIS — M7552 Bursitis of left shoulder: Secondary | ICD-10-CM | POA: Diagnosis not present

## 2017-06-26 DIAGNOSIS — R7301 Impaired fasting glucose: Secondary | ICD-10-CM | POA: Diagnosis not present

## 2017-06-26 DIAGNOSIS — R5383 Other fatigue: Secondary | ICD-10-CM | POA: Diagnosis not present

## 2017-06-26 DIAGNOSIS — R232 Flushing: Secondary | ICD-10-CM | POA: Diagnosis not present

## 2017-06-26 DIAGNOSIS — N898 Other specified noninflammatory disorders of vagina: Secondary | ICD-10-CM | POA: Diagnosis not present

## 2017-06-26 DIAGNOSIS — I10 Essential (primary) hypertension: Secondary | ICD-10-CM | POA: Diagnosis not present

## 2017-06-26 DIAGNOSIS — Z6841 Body Mass Index (BMI) 40.0 and over, adult: Secondary | ICD-10-CM | POA: Diagnosis not present

## 2017-06-26 DIAGNOSIS — G479 Sleep disorder, unspecified: Secondary | ICD-10-CM | POA: Diagnosis not present

## 2017-06-26 DIAGNOSIS — R6882 Decreased libido: Secondary | ICD-10-CM | POA: Diagnosis not present

## 2017-06-30 DIAGNOSIS — I10 Essential (primary) hypertension: Secondary | ICD-10-CM | POA: Diagnosis not present

## 2017-06-30 DIAGNOSIS — R7301 Impaired fasting glucose: Secondary | ICD-10-CM | POA: Diagnosis not present

## 2017-06-30 DIAGNOSIS — Z6841 Body Mass Index (BMI) 40.0 and over, adult: Secondary | ICD-10-CM | POA: Diagnosis not present

## 2017-07-03 DIAGNOSIS — Z79899 Other long term (current) drug therapy: Secondary | ICD-10-CM | POA: Diagnosis not present

## 2017-07-07 DIAGNOSIS — Z6830 Body mass index (BMI) 30.0-30.9, adult: Secondary | ICD-10-CM | POA: Diagnosis not present

## 2017-07-07 DIAGNOSIS — R7301 Impaired fasting glucose: Secondary | ICD-10-CM | POA: Diagnosis not present

## 2017-07-07 DIAGNOSIS — I10 Essential (primary) hypertension: Secondary | ICD-10-CM | POA: Diagnosis not present

## 2017-07-07 DIAGNOSIS — E669 Obesity, unspecified: Secondary | ICD-10-CM | POA: Diagnosis not present

## 2017-07-10 DIAGNOSIS — Z947 Corneal transplant status: Secondary | ICD-10-CM | POA: Diagnosis not present

## 2017-07-10 DIAGNOSIS — H04122 Dry eye syndrome of left lacrimal gland: Secondary | ICD-10-CM | POA: Diagnosis not present

## 2017-07-10 DIAGNOSIS — H2702 Aphakia, left eye: Secondary | ICD-10-CM | POA: Diagnosis not present

## 2017-07-14 DIAGNOSIS — M1990 Unspecified osteoarthritis, unspecified site: Secondary | ICD-10-CM | POA: Diagnosis not present

## 2017-07-14 DIAGNOSIS — Z79899 Other long term (current) drug therapy: Secondary | ICD-10-CM | POA: Diagnosis not present

## 2017-07-14 DIAGNOSIS — I10 Essential (primary) hypertension: Secondary | ICD-10-CM | POA: Diagnosis not present

## 2017-07-14 DIAGNOSIS — R7301 Impaired fasting glucose: Secondary | ICD-10-CM | POA: Diagnosis not present

## 2017-07-14 DIAGNOSIS — M47816 Spondylosis without myelopathy or radiculopathy, lumbar region: Secondary | ICD-10-CM | POA: Diagnosis not present

## 2017-07-14 DIAGNOSIS — Z6839 Body mass index (BMI) 39.0-39.9, adult: Secondary | ICD-10-CM | POA: Diagnosis not present

## 2017-07-14 DIAGNOSIS — R072 Precordial pain: Secondary | ICD-10-CM | POA: Diagnosis not present

## 2017-07-14 DIAGNOSIS — F17200 Nicotine dependence, unspecified, uncomplicated: Secondary | ICD-10-CM | POA: Diagnosis not present

## 2017-07-17 DIAGNOSIS — R1900 Intra-abdominal and pelvic swelling, mass and lump, unspecified site: Secondary | ICD-10-CM | POA: Diagnosis not present

## 2017-07-21 DIAGNOSIS — Z6837 Body mass index (BMI) 37.0-37.9, adult: Secondary | ICD-10-CM | POA: Diagnosis not present

## 2017-07-21 DIAGNOSIS — I10 Essential (primary) hypertension: Secondary | ICD-10-CM | POA: Diagnosis not present

## 2017-07-24 ENCOUNTER — Encounter: Payer: Self-pay | Admitting: Internal Medicine

## 2017-07-24 ENCOUNTER — Encounter (INDEPENDENT_AMBULATORY_CARE_PROVIDER_SITE_OTHER): Payer: Self-pay

## 2017-07-24 ENCOUNTER — Ambulatory Visit (INDEPENDENT_AMBULATORY_CARE_PROVIDER_SITE_OTHER): Payer: PPO | Admitting: Internal Medicine

## 2017-07-24 VITALS — BP 124/84 | HR 55 | Ht 64.0 in | Wt 221.4 lb

## 2017-07-24 DIAGNOSIS — R0789 Other chest pain: Secondary | ICD-10-CM | POA: Diagnosis not present

## 2017-07-24 DIAGNOSIS — R0602 Shortness of breath: Secondary | ICD-10-CM | POA: Diagnosis not present

## 2017-07-24 DIAGNOSIS — R5383 Other fatigue: Secondary | ICD-10-CM | POA: Diagnosis not present

## 2017-07-24 DIAGNOSIS — I350 Nonrheumatic aortic (valve) stenosis: Secondary | ICD-10-CM | POA: Diagnosis not present

## 2017-07-24 DIAGNOSIS — I10 Essential (primary) hypertension: Secondary | ICD-10-CM

## 2017-07-24 DIAGNOSIS — E782 Mixed hyperlipidemia: Secondary | ICD-10-CM

## 2017-07-24 NOTE — Patient Instructions (Signed)
Your physician recommends that you continue on your current medications as directed. Please refer to the Current Medication list given to you today.  Your physician has requested that you have an echocardiogram. Echocardiography is a painless test that uses sound waves to create images of your heart. It provides your doctor with information about the size and shape of your heart and how well your heart's chambers and valves are working. This procedure takes approximately one hour. There are no restrictions for this procedure.  Your physician has requested that you have a lexiscan myoview. For further information please visit [email or web address]. Please follow instruction sheet, as given.  Your physician recommends that you return for lab work (cbc, tsh, bmet, lipids)

## 2017-07-24 NOTE — Progress Notes (Signed)
Cardiology Office Note   Date:  07/24/2017   ID:  Kelsey [name], Kelsey [name] [birth year], MRN [id number]  PCP:  Caffie Damme, MD  Cardiologist:   Dietrich Pates, MD   F/U of SOB  And AS    History of Present Illness: Kelsey [name] is a 72 y.o. female with a history of chest pain  Neg myovue  Echo with dynamic outflow obstruction  Pt also with a history of HTN   Echo showed LVEF was vigorous   Repeat echo showed mild AS  Last week a few episode of chest discomfort   NOt doing anything at the time  Lasted about 1 min   None this week   Tired all the time  Walking, going up steps she  gives out    Goes to gym  Does water aerobics  Keeps up May have to stop a little   Current Meds  Medication Sig  . aspirin 81 MG EC tablet Take 81 mg by mouth daily.    Marland Kitchen atorvastatin (LIPITOR) 40 MG tablet Take 40 mg by mouth every evening.  . Calcium Citrate (CAL-CITRATE PO) Take 1 tablet by mouth daily.   . chlorthalidone (HYGROTON) 25 MG tablet Take 25 mg by mouth daily.  . cyanocobalamin 1000 MCG tablet Take 1,000 mcg by mouth daily.   Marland Kitchen EPINEPHrine (EPIPEN JR) 0.15 MG/0.3ML injection Inject 0.15 mg into the muscle as needed.  . ergocalciferol (VITAMIN D2) 50000 UNITS capsule Take 50,000 Units by mouth once a week. Wednesday  . HYDROcodone-acetaminophen (NORCO) 7.5-325 MG tablet Take 1 tablet by mouth 2 (two) times daily.  . metoprolol (LOPRESSOR) 50 MG tablet Take 50 mg by mouth 2 (two) times daily.  . Multiple Vitamins-Minerals (MULTIVITAMIN WITH MINERALS) tablet Take 1 tablet by mouth daily.  . potassium chloride SA (KLOR-CON M20) 20 MEQ tablet Take 1 tablet (20 mEq total) by mouth daily.     Allergies:   Bee venom and Oxycodone-acetaminophen   Past Medical History:  Diagnosis Date  . Arthritis   . Heart murmur    refer to cardiologist note from dr Dietrich Pates  . Hyperlipidemia   . Hypertension   . Left ureteral calculus   . Wears dentures    upper  and partial lower  . Wears  glasses     Past Surgical History:  Procedure Laterality Date  . CARDIOVASCULAR STRESS TEST  03-31-2012   normal perfusion study/  no ischemia/  ef 69%  . COLONOSCOPY WITH ESOPHAGOGASTRODUODENOSCOPY (EGD)  09-21-2003  . CORNEAL TRANSPLANT Left 2002  . CYSTOSCOPY WITH URETEROSCOPY AND STENT PLACEMENT Left 01/07/2014   Procedure: CYSTOSCOPY WITH LEFT RETRGRADE, URETEROSCOPY , AND LASER LITHOTRIPSY STONE EXTRACTION AND LEFT STENT PLACEMENT;  Surgeon: Crist Fat, MD;  Location: [facility];  Service: Urology;  Laterality: Left;  . HOLMIUM LASER APPLICATION Left 01/07/2014   Procedure: HOLMIUM LASER APPLICATION;  Surgeon: Crist Fat, MD;  Location: [facility];  Service: Urology;  Laterality: Left;  . REVISION TOTAL KNEE ARTHROPLASTY  left 08-01-2003/  right 02-20-02006  . TOTAL KNEE ARTHROPLASTY  left 03-30-2003/  right 11-05-2004   post op left knee I & D arthroscopic lavage 04-20-2003  . TRANSTHORACIC ECHOCARDIOGRAM  08-28-2011  dr Gunnar Fusi Tyishia Aune   moderate lvh/  ef 60-65%/  grade 2 diastolic dysfunction/ normal lv function / hyperdynamic lv with outflow murmur / turbulance through LVOT , mild SAM/ moderate lae/ moderate tr/  trivial mv & pv  .  VAGINAL HYSTERECTOMY  age 72   w/ bilateral salpingoophorectomy and vesicovaginal fistula repair     Social History:  The patient  reports that she has been smoking cigarettes.  She has been smoking about 0.50 packs per day. she has never used smokeless tobacco. She reports that she drinks about 1.8 oz of alcohol per week. She reports that she does not use drugs.   Family History:  The patient's family history includes Hypertension in her brother and father.    ROS:  Please see the history of present illness. All other systems are reviewed and  Negative to the above problem except as noted.    PHYSICAL EXAM: VS:  BP 124/84   Pulse (!) 55   Ht 5\' 4"  (1.626 m)   Wt 221 lb 6.4 oz (100.4 kg)   SpO2 98%    BMI 38.00 kg/m   GEN: Well nourished, well developed, in no acute distress  HEENT: normal  Neck: no JVD, carotid bruits, or masses Cardiac: RRR;   GrIII/VI systolic murmur base  Gr II/VI at apex  No  rubs, or gallops,no edema  Respiratory:  clear to auscultation bilaterally, normal work of breathing GI: soft, nontender, nondistended, + BS  No hepatomegaly  MS: no deformity Moving all extremities   Skin: warm and dry, no rash Neuro:  Strength and sensation are intact Psych: euthymic mood, full affect   EKG:  EKG is ordered today.  SR 61  PAC     Lipid Panel    Component Value Date/Time   CHOL 242 (H) 04/12/2013 1030   TRIG 98.0 04/12/2013 1030   HDL 89.70 04/12/2013 1030   CHOLHDL 3 04/12/2013 1030   VLDL 19.6 04/12/2013 1030   LDLCALC 63 04/01/2012 0829   LDLDIRECT 141.4 04/12/2013 1030      Wt Readings from Last 3 Encounters:  07/24/17 221 lb 6.4 oz (100.4 kg)  08/23/16 239 lb 9.6 oz (108.7 kg)  08/18/15 226 lb 6.4 oz (102.7 kg)      ASSESSMENT AND PLAN:  1 Chest discomfort  Set up for myovue  2 Dyspnea  She gets SOB at times but is able to do water aerobics  Volume status is OK     Will order Lexiscan myovue to r/o ischema  Also set up for echo    3  AV dz  Repeat echo to reassess AV    4  HL  Repeat lipids     5  HTN  Good control    Check CBC, TSH, BMET    Current medicines are reviewed at length with the patient today.  The patient does not have concerns regarding medicines.  Signed, Dietrich PatesPaula Seana Underwood, MD  07/24/2017 12:33 PM    [facility] [address], [place], [place]  [address] Phone: [phone]; Fax: [phone]

## 2017-07-25 LAB — CBC
HEMOGLOBIN: 14.2 g/dL (ref 11.1–15.9)
Hematocrit: 43.1 % (ref 34.0–46.6)
MCH: 28.5 pg (ref 26.6–33.0)
MCHC: 32.9 g/dL (ref 31.5–35.7)
MCV: 87 fL (ref 79–97)
Platelets: 319 10*3/uL (ref 150–379)
RBC: 4.98 x10E6/uL (ref 3.77–5.28)
RDW: 14.8 % (ref 12.3–15.4)
WBC: 8.3 10*3/uL (ref 3.4–10.8)

## 2017-07-25 LAB — BASIC METABOLIC PANEL
BUN / CREAT RATIO: 17 (ref 12–28)
BUN: 15 mg/dL (ref 8–27)
CO2: 23 mmol/L (ref 20–29)
CREATININE: 0.89 mg/dL (ref 0.57–1.00)
Calcium: 10.6 mg/dL — ABNORMAL HIGH (ref 8.7–10.3)
Chloride: 98 mmol/L (ref 96–106)
GFR calc Af Amer: 76 mL/min/{1.73_m2} (ref >59)
GFR, EST NON AFRICAN AMERICAN: 66 mL/min/{1.73_m2} (ref >59)
Glucose: 92 mg/dL (ref 65–99)
Potassium: 3.6 mmol/L (ref 3.5–5.2)
SODIUM: 140 mmol/L (ref 134–144)

## 2017-07-25 LAB — LIPID PANEL
CHOLESTEROL TOTAL: 140 mg/dL (ref 100–199)
Chol/HDL Ratio: 2.2 ratio (ref 0.0–4.4)
HDL: 63 mg/dL (ref >39)
LDL Calculated: 62 mg/dL (ref 0–99)
Triglycerides: 73 mg/dL (ref 0–149)
VLDL CHOLESTEROL CAL: 15 mg/dL (ref 5–40)

## 2017-07-25 LAB — TSH: TSH: 0.702 u[IU]/mL (ref 0.450–4.500)

## 2017-07-28 DIAGNOSIS — Z6837 Body mass index (BMI) 37.0-37.9, adult: Secondary | ICD-10-CM | POA: Diagnosis not present

## 2017-07-28 DIAGNOSIS — R1900 Intra-abdominal and pelvic swelling, mass and lump, unspecified site: Secondary | ICD-10-CM | POA: Diagnosis not present

## 2017-07-28 DIAGNOSIS — R7301 Impaired fasting glucose: Secondary | ICD-10-CM | POA: Diagnosis not present

## 2017-07-28 DIAGNOSIS — I10 Essential (primary) hypertension: Secondary | ICD-10-CM | POA: Diagnosis not present

## 2017-07-29 ENCOUNTER — Telehealth (HOSPITAL_COMMUNITY): Payer: Self-pay | Admitting: *Deleted

## 2017-07-29 NOTE — Telephone Encounter (Signed)
Patient given detailed instructions per Myocardial Perfusion Study Information Sheet for the test on 08/01/17 at 0815. Patient notified to arrive 15 minutes early and that it is imperative to arrive on time for appointment to keep from having the test rescheduled.  If you need to cancel or reschedule your appointment, please call the office within 24 hours of your appointment. . Patient verbalized understanding.Maryah Marinaro, Adelene IdlerCynthia W

## 2017-08-01 ENCOUNTER — Ambulatory Visit (HOSPITAL_BASED_OUTPATIENT_CLINIC_OR_DEPARTMENT_OTHER): Payer: PPO

## 2017-08-01 ENCOUNTER — Encounter (INDEPENDENT_AMBULATORY_CARE_PROVIDER_SITE_OTHER): Payer: Self-pay

## 2017-08-01 ENCOUNTER — Other Ambulatory Visit: Payer: Self-pay

## 2017-08-01 ENCOUNTER — Ambulatory Visit (HOSPITAL_COMMUNITY): Payer: PPO | Attending: Internal Medicine

## 2017-08-01 DIAGNOSIS — R5383 Other fatigue: Secondary | ICD-10-CM | POA: Insufficient documentation

## 2017-08-01 DIAGNOSIS — I350 Nonrheumatic aortic (valve) stenosis: Secondary | ICD-10-CM | POA: Diagnosis not present

## 2017-08-01 DIAGNOSIS — R0602 Shortness of breath: Secondary | ICD-10-CM

## 2017-08-01 LAB — MYOCARDIAL PERFUSION IMAGING
CHL CUP NUCLEAR SSS: 7
LVDIAVOL: 85 mL (ref 46–106)
LVSYSVOL: 39 mL
NUC STRESS TID: 0.9
Peak HR: 93 {beats}/min
RATE: 0.29
Rest HR: 62 {beats}/min
SDS: 2
SRS: 5

## 2017-08-01 MED ORDER — TECHNETIUM TC 99M TETROFOSMIN IV KIT
31.7000 | PACK | Freq: Once | INTRAVENOUS | Status: AC | PRN
  Administered 2017-08-01: 31.7 via INTRAVENOUS
  Filled 2017-08-01: qty 32

## 2017-08-01 MED ORDER — TECHNETIUM TC 99M TETROFOSMIN IV KIT
10.9000 | PACK | Freq: Once | INTRAVENOUS | Status: AC | PRN
  Administered 2017-08-01: 10.9 via INTRAVENOUS
  Filled 2017-08-01: qty 11

## 2017-08-01 MED ORDER — REGADENOSON 0.4 MG/5ML IV SOLN
0.4000 mg | Freq: Once | INTRAVENOUS | Status: AC
  Administered 2017-08-01: 0.4 mg via INTRAVENOUS

## 2017-08-04 DIAGNOSIS — E785 Hyperlipidemia, unspecified: Secondary | ICD-10-CM | POA: Diagnosis not present

## 2017-08-04 DIAGNOSIS — R7301 Impaired fasting glucose: Secondary | ICD-10-CM | POA: Diagnosis not present

## 2017-08-04 DIAGNOSIS — Z713 Dietary counseling and surveillance: Secondary | ICD-10-CM | POA: Diagnosis not present

## 2017-08-04 DIAGNOSIS — I10 Essential (primary) hypertension: Secondary | ICD-10-CM | POA: Diagnosis not present

## 2017-08-04 DIAGNOSIS — Z6836 Body mass index (BMI) 36.0-36.9, adult: Secondary | ICD-10-CM | POA: Diagnosis not present

## 2017-08-05 DIAGNOSIS — M75112 Incomplete rotator cuff tear or rupture of left shoulder, not specified as traumatic: Secondary | ICD-10-CM | POA: Diagnosis not present

## 2017-08-05 DIAGNOSIS — M7552 Bursitis of left shoulder: Secondary | ICD-10-CM | POA: Diagnosis not present

## 2017-08-11 DIAGNOSIS — I10 Essential (primary) hypertension: Secondary | ICD-10-CM | POA: Diagnosis not present

## 2017-08-11 DIAGNOSIS — Z6836 Body mass index (BMI) 36.0-36.9, adult: Secondary | ICD-10-CM | POA: Diagnosis not present

## 2017-08-11 DIAGNOSIS — E785 Hyperlipidemia, unspecified: Secondary | ICD-10-CM | POA: Diagnosis not present

## 2017-08-11 DIAGNOSIS — Z713 Dietary counseling and surveillance: Secondary | ICD-10-CM | POA: Diagnosis not present

## 2017-08-14 DIAGNOSIS — M47816 Spondylosis without myelopathy or radiculopathy, lumbar region: Secondary | ICD-10-CM | POA: Diagnosis not present

## 2017-08-14 DIAGNOSIS — D539 Nutritional anemia, unspecified: Secondary | ICD-10-CM | POA: Diagnosis not present

## 2017-08-14 DIAGNOSIS — M1990 Unspecified osteoarthritis, unspecified site: Secondary | ICD-10-CM | POA: Diagnosis not present

## 2017-08-14 DIAGNOSIS — E782 Mixed hyperlipidemia: Secondary | ICD-10-CM | POA: Diagnosis not present

## 2017-08-14 DIAGNOSIS — F17200 Nicotine dependence, unspecified, uncomplicated: Secondary | ICD-10-CM | POA: Diagnosis not present

## 2017-08-14 DIAGNOSIS — E559 Vitamin D deficiency, unspecified: Secondary | ICD-10-CM | POA: Diagnosis not present

## 2017-08-14 DIAGNOSIS — I10 Essential (primary) hypertension: Secondary | ICD-10-CM | POA: Diagnosis not present

## 2017-08-14 DIAGNOSIS — Z79899 Other long term (current) drug therapy: Secondary | ICD-10-CM | POA: Diagnosis not present

## 2017-08-14 DIAGNOSIS — R7303 Prediabetes: Secondary | ICD-10-CM | POA: Diagnosis not present

## 2017-08-18 DIAGNOSIS — I10 Essential (primary) hypertension: Secondary | ICD-10-CM | POA: Diagnosis not present

## 2017-08-18 DIAGNOSIS — R7301 Impaired fasting glucose: Secondary | ICD-10-CM | POA: Diagnosis not present

## 2017-08-18 DIAGNOSIS — Z713 Dietary counseling and surveillance: Secondary | ICD-10-CM | POA: Diagnosis not present

## 2017-08-18 DIAGNOSIS — Z6836 Body mass index (BMI) 36.0-36.9, adult: Secondary | ICD-10-CM | POA: Diagnosis not present

## 2017-08-21 DIAGNOSIS — H4032X2 Glaucoma secondary to eye trauma, left eye, moderate stage: Secondary | ICD-10-CM | POA: Diagnosis not present

## 2017-08-21 DIAGNOSIS — H2702 Aphakia, left eye: Secondary | ICD-10-CM | POA: Diagnosis not present

## 2017-08-21 DIAGNOSIS — Z947 Corneal transplant status: Secondary | ICD-10-CM | POA: Diagnosis not present

## 2017-08-25 DIAGNOSIS — Z6835 Body mass index (BMI) 35.0-35.9, adult: Secondary | ICD-10-CM | POA: Diagnosis not present

## 2017-08-25 DIAGNOSIS — Z713 Dietary counseling and surveillance: Secondary | ICD-10-CM | POA: Diagnosis not present

## 2017-08-25 DIAGNOSIS — I10 Essential (primary) hypertension: Secondary | ICD-10-CM | POA: Diagnosis not present

## 2017-08-28 DIAGNOSIS — M24112 Other articular cartilage disorders, left shoulder: Secondary | ICD-10-CM | POA: Diagnosis not present

## 2017-08-28 DIAGNOSIS — M25412 Effusion, left shoulder: Secondary | ICD-10-CM | POA: Diagnosis not present

## 2017-08-28 DIAGNOSIS — M19012 Primary osteoarthritis, left shoulder: Secondary | ICD-10-CM | POA: Diagnosis not present

## 2017-08-28 DIAGNOSIS — M7552 Bursitis of left shoulder: Secondary | ICD-10-CM | POA: Diagnosis not present

## 2017-08-28 DIAGNOSIS — M7522 Bicipital tendinitis, left shoulder: Secondary | ICD-10-CM | POA: Diagnosis not present

## 2017-08-28 DIAGNOSIS — M67912 Unspecified disorder of synovium and tendon, left shoulder: Secondary | ICD-10-CM | POA: Diagnosis not present

## 2017-08-28 DIAGNOSIS — M75112 Incomplete rotator cuff tear or rupture of left shoulder, not specified as traumatic: Secondary | ICD-10-CM | POA: Diagnosis not present

## 2017-09-01 DIAGNOSIS — E669 Obesity, unspecified: Secondary | ICD-10-CM | POA: Diagnosis not present

## 2017-09-01 DIAGNOSIS — R7301 Impaired fasting glucose: Secondary | ICD-10-CM | POA: Diagnosis not present

## 2017-09-01 DIAGNOSIS — I10 Essential (primary) hypertension: Secondary | ICD-10-CM | POA: Diagnosis not present

## 2017-09-01 DIAGNOSIS — Z713 Dietary counseling and surveillance: Secondary | ICD-10-CM | POA: Diagnosis not present

## 2017-09-08 DIAGNOSIS — Z6835 Body mass index (BMI) 35.0-35.9, adult: Secondary | ICD-10-CM | POA: Diagnosis not present

## 2017-09-08 DIAGNOSIS — Z713 Dietary counseling and surveillance: Secondary | ICD-10-CM | POA: Diagnosis not present

## 2017-09-08 DIAGNOSIS — R7301 Impaired fasting glucose: Secondary | ICD-10-CM | POA: Diagnosis not present

## 2017-09-08 DIAGNOSIS — E669 Obesity, unspecified: Secondary | ICD-10-CM | POA: Diagnosis not present

## 2017-09-08 DIAGNOSIS — I10 Essential (primary) hypertension: Secondary | ICD-10-CM | POA: Diagnosis not present

## 2017-09-08 DIAGNOSIS — Z6837 Body mass index (BMI) 37.0-37.9, adult: Secondary | ICD-10-CM | POA: Diagnosis not present

## 2017-09-08 DIAGNOSIS — R5383 Other fatigue: Secondary | ICD-10-CM | POA: Diagnosis not present

## 2017-09-08 DIAGNOSIS — E785 Hyperlipidemia, unspecified: Secondary | ICD-10-CM | POA: Diagnosis not present

## 2017-09-11 DIAGNOSIS — M8580 Other specified disorders of bone density and structure, unspecified site: Secondary | ICD-10-CM | POA: Diagnosis not present

## 2017-09-11 DIAGNOSIS — I10 Essential (primary) hypertension: Secondary | ICD-10-CM | POA: Diagnosis not present

## 2017-09-11 DIAGNOSIS — Z79899 Other long term (current) drug therapy: Secondary | ICD-10-CM | POA: Diagnosis not present

## 2017-09-11 DIAGNOSIS — Z0000 Encounter for general adult medical examination without abnormal findings: Secondary | ICD-10-CM | POA: Diagnosis not present

## 2017-09-11 DIAGNOSIS — E782 Mixed hyperlipidemia: Secondary | ICD-10-CM | POA: Diagnosis not present

## 2017-09-11 DIAGNOSIS — F17200 Nicotine dependence, unspecified, uncomplicated: Secondary | ICD-10-CM | POA: Diagnosis not present

## 2017-09-11 DIAGNOSIS — M1990 Unspecified osteoarthritis, unspecified site: Secondary | ICD-10-CM | POA: Diagnosis not present

## 2017-09-11 DIAGNOSIS — M47816 Spondylosis without myelopathy or radiculopathy, lumbar region: Secondary | ICD-10-CM | POA: Diagnosis not present

## 2017-09-15 DIAGNOSIS — Z713 Dietary counseling and surveillance: Secondary | ICD-10-CM | POA: Diagnosis not present

## 2017-09-15 DIAGNOSIS — Z6835 Body mass index (BMI) 35.0-35.9, adult: Secondary | ICD-10-CM | POA: Diagnosis not present

## 2017-09-15 DIAGNOSIS — E785 Hyperlipidemia, unspecified: Secondary | ICD-10-CM | POA: Diagnosis not present

## 2017-09-15 DIAGNOSIS — I10 Essential (primary) hypertension: Secondary | ICD-10-CM | POA: Diagnosis not present

## 2017-09-15 DIAGNOSIS — M19012 Primary osteoarthritis, left shoulder: Secondary | ICD-10-CM | POA: Diagnosis not present

## 2017-09-15 DIAGNOSIS — R7301 Impaired fasting glucose: Secondary | ICD-10-CM | POA: Diagnosis not present

## 2017-09-22 DIAGNOSIS — R7301 Impaired fasting glucose: Secondary | ICD-10-CM | POA: Diagnosis not present

## 2017-09-22 DIAGNOSIS — I10 Essential (primary) hypertension: Secondary | ICD-10-CM | POA: Diagnosis not present

## 2017-09-22 DIAGNOSIS — Z713 Dietary counseling and surveillance: Secondary | ICD-10-CM | POA: Diagnosis not present

## 2017-09-22 DIAGNOSIS — Z6835 Body mass index (BMI) 35.0-35.9, adult: Secondary | ICD-10-CM | POA: Diagnosis not present

## 2017-09-29 DIAGNOSIS — I10 Essential (primary) hypertension: Secondary | ICD-10-CM | POA: Diagnosis not present

## 2017-09-29 DIAGNOSIS — E669 Obesity, unspecified: Secondary | ICD-10-CM | POA: Diagnosis not present

## 2017-09-29 DIAGNOSIS — Z713 Dietary counseling and surveillance: Secondary | ICD-10-CM | POA: Diagnosis not present

## 2017-10-06 DIAGNOSIS — Z6835 Body mass index (BMI) 35.0-35.9, adult: Secondary | ICD-10-CM | POA: Diagnosis not present

## 2017-10-06 DIAGNOSIS — Z713 Dietary counseling and surveillance: Secondary | ICD-10-CM | POA: Diagnosis not present

## 2017-10-06 DIAGNOSIS — I10 Essential (primary) hypertension: Secondary | ICD-10-CM | POA: Diagnosis not present

## 2017-10-10 DIAGNOSIS — Z79899 Other long term (current) drug therapy: Secondary | ICD-10-CM | POA: Diagnosis not present

## 2017-10-10 DIAGNOSIS — Z1231 Encounter for screening mammogram for malignant neoplasm of breast: Secondary | ICD-10-CM | POA: Diagnosis not present

## 2017-10-10 DIAGNOSIS — M810 Age-related osteoporosis without current pathological fracture: Secondary | ICD-10-CM | POA: Diagnosis not present

## 2017-10-10 DIAGNOSIS — M47816 Spondylosis without myelopathy or radiculopathy, lumbar region: Secondary | ICD-10-CM | POA: Diagnosis not present

## 2017-10-10 DIAGNOSIS — F17200 Nicotine dependence, unspecified, uncomplicated: Secondary | ICD-10-CM | POA: Diagnosis not present

## 2017-10-10 DIAGNOSIS — M1990 Unspecified osteoarthritis, unspecified site: Secondary | ICD-10-CM | POA: Diagnosis not present

## 2017-10-10 DIAGNOSIS — I10 Essential (primary) hypertension: Secondary | ICD-10-CM | POA: Diagnosis not present

## 2017-10-13 DIAGNOSIS — E669 Obesity, unspecified: Secondary | ICD-10-CM | POA: Diagnosis not present

## 2017-10-13 DIAGNOSIS — Z713 Dietary counseling and surveillance: Secondary | ICD-10-CM | POA: Diagnosis not present

## 2017-10-13 DIAGNOSIS — I10 Essential (primary) hypertension: Secondary | ICD-10-CM | POA: Diagnosis not present

## 2017-11-18 DIAGNOSIS — I10 Essential (primary) hypertension: Secondary | ICD-10-CM | POA: Diagnosis not present

## 2017-11-18 DIAGNOSIS — R634 Abnormal weight loss: Secondary | ICD-10-CM | POA: Diagnosis not present

## 2017-12-02 DIAGNOSIS — M47816 Spondylosis without myelopathy or radiculopathy, lumbar region: Secondary | ICD-10-CM | POA: Diagnosis not present

## 2017-12-02 DIAGNOSIS — E559 Vitamin D deficiency, unspecified: Secondary | ICD-10-CM | POA: Diagnosis not present

## 2017-12-02 DIAGNOSIS — I10 Essential (primary) hypertension: Secondary | ICD-10-CM | POA: Diagnosis not present

## 2017-12-02 DIAGNOSIS — R0602 Shortness of breath: Secondary | ICD-10-CM | POA: Diagnosis not present

## 2017-12-02 DIAGNOSIS — R7303 Prediabetes: Secondary | ICD-10-CM | POA: Diagnosis not present

## 2017-12-02 DIAGNOSIS — M1990 Unspecified osteoarthritis, unspecified site: Secondary | ICD-10-CM | POA: Diagnosis not present

## 2017-12-02 DIAGNOSIS — E782 Mixed hyperlipidemia: Secondary | ICD-10-CM | POA: Diagnosis not present

## 2017-12-02 DIAGNOSIS — D539 Nutritional anemia, unspecified: Secondary | ICD-10-CM | POA: Diagnosis not present

## 2017-12-02 DIAGNOSIS — Z79899 Other long term (current) drug therapy: Secondary | ICD-10-CM | POA: Diagnosis not present

## 2017-12-02 DIAGNOSIS — Z1331 Encounter for screening for depression: Secondary | ICD-10-CM | POA: Diagnosis not present

## 2017-12-02 DIAGNOSIS — Z1339 Encounter for screening examination for other mental health and behavioral disorders: Secondary | ICD-10-CM | POA: Diagnosis not present

## 2018-01-13 DIAGNOSIS — I10 Essential (primary) hypertension: Secondary | ICD-10-CM | POA: Diagnosis not present

## 2018-01-13 DIAGNOSIS — K219 Gastro-esophageal reflux disease without esophagitis: Secondary | ICD-10-CM | POA: Diagnosis not present

## 2018-01-13 DIAGNOSIS — Z87891 Personal history of nicotine dependence: Secondary | ICD-10-CM | POA: Diagnosis not present

## 2018-01-13 DIAGNOSIS — R942 Abnormal results of pulmonary function studies: Secondary | ICD-10-CM | POA: Diagnosis not present

## 2018-01-13 DIAGNOSIS — R0602 Shortness of breath: Secondary | ICD-10-CM | POA: Diagnosis not present

## 2018-01-13 DIAGNOSIS — F17200 Nicotine dependence, unspecified, uncomplicated: Secondary | ICD-10-CM | POA: Diagnosis not present

## 2018-01-13 DIAGNOSIS — Z6833 Body mass index (BMI) 33.0-33.9, adult: Secondary | ICD-10-CM | POA: Diagnosis not present

## 2018-01-14 DIAGNOSIS — Z6834 Body mass index (BMI) 34.0-34.9, adult: Secondary | ICD-10-CM | POA: Diagnosis not present

## 2018-01-14 DIAGNOSIS — E6609 Other obesity due to excess calories: Secondary | ICD-10-CM | POA: Diagnosis not present

## 2018-01-14 DIAGNOSIS — M793 Panniculitis, unspecified: Secondary | ICD-10-CM | POA: Diagnosis not present

## 2018-02-04 DIAGNOSIS — Z79899 Other long term (current) drug therapy: Secondary | ICD-10-CM | POA: Diagnosis not present

## 2018-02-04 DIAGNOSIS — M1990 Unspecified osteoarthritis, unspecified site: Secondary | ICD-10-CM | POA: Diagnosis not present

## 2018-02-04 DIAGNOSIS — M810 Age-related osteoporosis without current pathological fracture: Secondary | ICD-10-CM | POA: Diagnosis not present

## 2018-02-04 DIAGNOSIS — I10 Essential (primary) hypertension: Secondary | ICD-10-CM | POA: Diagnosis not present

## 2018-02-04 DIAGNOSIS — M47816 Spondylosis without myelopathy or radiculopathy, lumbar region: Secondary | ICD-10-CM | POA: Diagnosis not present

## 2018-02-28 DIAGNOSIS — H538 Other visual disturbances: Secondary | ICD-10-CM | POA: Diagnosis not present

## 2018-02-28 DIAGNOSIS — W51XXXA Accidental striking against or bumped into by another person, initial encounter: Secondary | ICD-10-CM | POA: Diagnosis not present

## 2018-02-28 DIAGNOSIS — H2702 Aphakia, left eye: Secondary | ICD-10-CM | POA: Diagnosis not present

## 2018-02-28 DIAGNOSIS — S0532XA Ocular laceration without prolapse or loss of intraocular tissue, left eye, initial encounter: Secondary | ICD-10-CM | POA: Diagnosis not present

## 2018-02-28 DIAGNOSIS — T86848 Other complications of corneal transplant: Secondary | ICD-10-CM | POA: Diagnosis not present

## 2018-02-28 DIAGNOSIS — H5712 Ocular pain, left eye: Secondary | ICD-10-CM | POA: Diagnosis not present

## 2018-02-28 DIAGNOSIS — S0522XA Ocular laceration and rupture with prolapse or loss of intraocular tissue, left eye, initial encounter: Secondary | ICD-10-CM | POA: Diagnosis not present

## 2018-02-28 DIAGNOSIS — Z947 Corneal transplant status: Secondary | ICD-10-CM | POA: Diagnosis not present

## 2018-02-28 DIAGNOSIS — Y998 Other external cause status: Secondary | ICD-10-CM | POA: Diagnosis not present

## 2018-03-03 ENCOUNTER — Other Ambulatory Visit: Payer: Self-pay

## 2018-03-03 NOTE — Patient Outreach (Signed)
[facility] Care Management  03/03/2018  Kelsey [name] [birth year] [id number]   Referral Date: 03/03/18 Referral Source: [facility] Referral Reason: Having problems paying for predforte, timolol, and hydrocodone   Outreach Attempt: Spoke with patient.  She is able to verify HIPAA.  Discussed reason for referral.  Patient acknowledges having problems paying for medication.    Patient lives alone and is independent with all care. Patient sees physician regularly and has transportation to appointments.  Patient takes her medications as prescribed but has problems affording medications.    Discussed [facility] services.  Patient agreeable to pharmacy referral for medication assistance.    Plan: RN CM will refer to pharmacy for medication assistance.     Kelsey Lericheionne J Laquincy Eastridge, RN, MSN [facility] Care Management Coordinator Direct Line [phone] Toll Free: [phone]  Fax: [phone]

## 2018-03-04 ENCOUNTER — Other Ambulatory Visit: Payer: Self-pay

## 2018-03-04 NOTE — Patient Outreach (Signed)
[facility] Care Management  03/04/2018  Wynetta EmeryCarolyn J Salido [birth year] [id number]  72 year old [facility]. [facility] requested for medication assistance for timolol, Pred Forte and hydrocodone.  PMHx includes, but not limited to, hypertension, osteoarthritis and hyperplipidemia.  Successful outreach attempt to Ms. Darin EngelsAbraham.  HIPAA identifiers verified.  Medication Assistance: Ms. Darin Engelsbraham states that she can afford her prescriptions, but it would be nice to have help if it was available. She states that she pays $10-$15 for her prescriptions for timolol, hydrocodone and Pred Forte.   Informed Ms. Darin Engelsbraham that she is getting inexpensive generics and that there is no patient assistance for these prescriptions.    She states that she has no further medication questions or concerns at this time. Informed her that I will [facility].  She is aware that she can call me in the future should medication issues arise.   Plan[facility].   Route case closure letter to PCP, Dr. Katrinka BlazingSmith.   Berlin HunJennifer Catina Nuss, PharmD Clinical Pharmacist [facility] [phone]

## 2018-03-12 DIAGNOSIS — H2702 Aphakia, left eye: Secondary | ICD-10-CM | POA: Diagnosis not present

## 2018-03-12 DIAGNOSIS — T1502XA Foreign body in cornea, left eye, initial encounter: Secondary | ICD-10-CM | POA: Diagnosis not present

## 2018-03-12 DIAGNOSIS — Z947 Corneal transplant status: Secondary | ICD-10-CM | POA: Diagnosis not present

## 2018-03-25 DIAGNOSIS — E782 Mixed hyperlipidemia: Secondary | ICD-10-CM | POA: Diagnosis not present

## 2018-03-25 DIAGNOSIS — R7303 Prediabetes: Secondary | ICD-10-CM | POA: Diagnosis not present

## 2018-03-25 DIAGNOSIS — Z23 Encounter for immunization: Secondary | ICD-10-CM | POA: Diagnosis not present

## 2018-03-25 DIAGNOSIS — Z79899 Other long term (current) drug therapy: Secondary | ICD-10-CM | POA: Diagnosis not present

## 2018-03-25 DIAGNOSIS — R5383 Other fatigue: Secondary | ICD-10-CM | POA: Diagnosis not present

## 2018-03-25 DIAGNOSIS — M1990 Unspecified osteoarthritis, unspecified site: Secondary | ICD-10-CM | POA: Diagnosis not present

## 2018-03-25 DIAGNOSIS — E559 Vitamin D deficiency, unspecified: Secondary | ICD-10-CM | POA: Diagnosis not present

## 2018-03-25 DIAGNOSIS — Z947 Corneal transplant status: Secondary | ICD-10-CM | POA: Diagnosis not present

## 2018-03-25 DIAGNOSIS — I10 Essential (primary) hypertension: Secondary | ICD-10-CM | POA: Diagnosis not present

## 2018-03-25 DIAGNOSIS — D539 Nutritional anemia, unspecified: Secondary | ICD-10-CM | POA: Diagnosis not present

## 2018-03-26 DIAGNOSIS — M793 Panniculitis, unspecified: Secondary | ICD-10-CM | POA: Diagnosis not present

## 2018-03-26 DIAGNOSIS — Z6834 Body mass index (BMI) 34.0-34.9, adult: Secondary | ICD-10-CM | POA: Diagnosis not present

## 2018-03-26 DIAGNOSIS — E6609 Other obesity due to excess calories: Secondary | ICD-10-CM | POA: Diagnosis not present

## 2018-03-29 IMAGING — NM NM MISC PROCEDURE
6 series · 36 of 36 positions shown · non-contrast
Comparison: none

[Series 1: stress-sum-em · 6.40mm/px · 6 of 64 frames shown]
[frame 6/64]
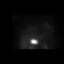
[frame 16/64]
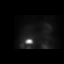
[frame 27/64]
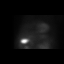
[frame 38/64]
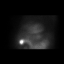
[frame 48/64]
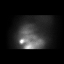
[frame 59/64]
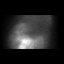

[Series 1: rest · 6.40mm/px · 6 of 64 frames shown]
[frame 6/64]
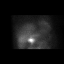
[frame 16/64]
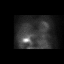
[frame 27/64]
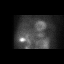
[frame 38/64]
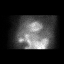
[frame 48/64]
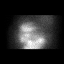
[frame 59/64]
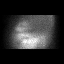

[Series 1: stress-gsp · 6.40mm/px · 6 of 512 frames shown]
[frame 43/512]
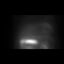
[frame 128/512]
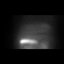
[frame 214/512]
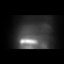
[frame 299/512]
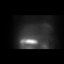
[frame 384/512]
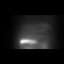
[frame 470/512]
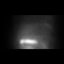

[Series 1: wbr_r-proj_st rest · 6.40mm/px · 6 of 64 frames shown]
[frame 6/64]
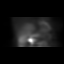
[frame 16/64]
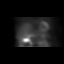
[frame 27/64]
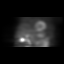
[frame 38/64]
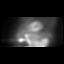
[frame 48/64]
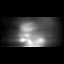
[frame 59/64]
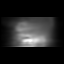

[Series 1: wbr_s-proj_st stress-gsp · 6.40mm/px · 6 of 512 frames shown]
[frame 43/512]
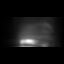
[frame 128/512]
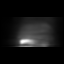
[frame 214/512]
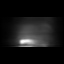
[frame 299/512]
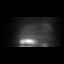
[frame 384/512]
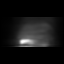
[frame 470/512]
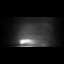

[Series 1: wbr_s-proj_st stress-sum-em · 6.40mm/px · 6 of 64 frames shown]
[frame 6/64]
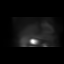
[frame 16/64]
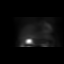
[frame 27/64]
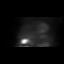
[frame 38/64]
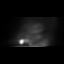
[frame 48/64]
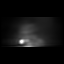
[frame 59/64]
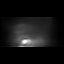

[36 of 36 positions shown; findings below may reference images not displayed]

Canned report from images found in remote index.

Refer to host system for actual result text.

## 2018-04-15 DIAGNOSIS — H4052X2 Glaucoma secondary to other eye disorders, left eye, moderate stage: Secondary | ICD-10-CM | POA: Diagnosis not present

## 2018-04-15 DIAGNOSIS — Z961 Presence of intraocular lens: Secondary | ICD-10-CM | POA: Diagnosis not present

## 2018-05-26 DIAGNOSIS — I350 Nonrheumatic aortic (valve) stenosis: Secondary | ICD-10-CM | POA: Diagnosis not present

## 2018-05-26 DIAGNOSIS — Z79899 Other long term (current) drug therapy: Secondary | ICD-10-CM | POA: Diagnosis not present

## 2018-05-26 DIAGNOSIS — M47816 Spondylosis without myelopathy or radiculopathy, lumbar region: Secondary | ICD-10-CM | POA: Diagnosis not present

## 2018-05-26 DIAGNOSIS — R001 Bradycardia, unspecified: Secondary | ICD-10-CM | POA: Diagnosis not present

## 2018-05-26 DIAGNOSIS — M1990 Unspecified osteoarthritis, unspecified site: Secondary | ICD-10-CM | POA: Diagnosis not present

## 2018-05-27 ENCOUNTER — Encounter: Payer: Self-pay | Admitting: Physician Assistant

## 2018-05-27 ENCOUNTER — Encounter (INDEPENDENT_AMBULATORY_CARE_PROVIDER_SITE_OTHER): Payer: PPO

## 2018-05-27 ENCOUNTER — Telehealth: Payer: Self-pay | Admitting: Internal Medicine

## 2018-05-27 ENCOUNTER — Ambulatory Visit: Payer: PPO | Admitting: Physician Assistant

## 2018-05-27 VITALS — BP 196/92 | HR 63 | Ht 64.0 in | Wt 202.8 lb

## 2018-05-27 DIAGNOSIS — R079 Chest pain, unspecified: Secondary | ICD-10-CM

## 2018-05-27 DIAGNOSIS — R42 Dizziness and giddiness: Secondary | ICD-10-CM

## 2018-05-27 DIAGNOSIS — I350 Nonrheumatic aortic (valve) stenosis: Secondary | ICD-10-CM | POA: Insufficient documentation

## 2018-05-27 DIAGNOSIS — I10 Essential (primary) hypertension: Secondary | ICD-10-CM | POA: Diagnosis not present

## 2018-05-27 DIAGNOSIS — R001 Bradycardia, unspecified: Secondary | ICD-10-CM | POA: Diagnosis not present

## 2018-05-27 DIAGNOSIS — E785 Hyperlipidemia, unspecified: Secondary | ICD-10-CM

## 2018-05-27 MED ORDER — AMLODIPINE BESYLATE 5 MG PO TABS: 5.0000 mg | ORAL_TABLET | Freq: Every day | ORAL | 3 refills | Status: DC

## 2018-05-27 MED ORDER — METOPROLOL TARTRATE 25 MG PO TABS: 25.0000 mg | ORAL_TABLET | Freq: Two times a day (BID) | ORAL | 3 refills | Status: DC

## 2018-05-27 MED ORDER — CHLORTHALIDONE 50 MG PO TABS: 50.0000 mg | ORAL_TABLET | Freq: Every day | ORAL | 3 refills | Status: DC

## 2018-05-27 NOTE — Progress Notes (Signed)
Cardiology Office Note    Date:  05/27/2018   ID:  Keyshawna, Prouse 20-Mar-1946, MRN [id number]  PCP:  Glendon Axe, MD  Cardiologist: Dorris Carnes, MD EPS: None  Chief Complaint  Patient presents with  . Dizziness    History of Present Illness:  URA YINGLING is a 72 y.o. female with history of chest pain negative Myoview in the past and echo with dynamic outflow obstruction.  Also has history of hypertension and mild aortic stenosis.  Last saw Dr. Harrington Challenger 07/24/2017 at which time she complained of being tired all the time giving out when she goes up the stairs.  2D echo 08/01/2017 normal LVEF 60 to 65% with mild LVH, grade 1 DD, mild to moderate aortic stenosis mean gradient 20 mmHg, AVA 1.87 cm, left atrium mildly dilated.  Myoview LVEF 53% with a medium size moderate severity fixed inferior septal and septal bowel attenuation artifact.  No reversible ischemia, low risk study.  Patient added on to my schedule today because of 2-3 week history of dizziness and EKG done yesterday at primary care heart rate was 44.  Patient feels like her equilibrium is off for the past 2-3 weeks. Feels off balance when gets up but stays off balance. BP has been ok at home but doesn't check often. Has lost 19 lbs since Feb. by limiting salt and drinking more water. BP's high here today.  Denies chest pain, palpitations or edema.  Has had headaches.  Past Medical History:  Diagnosis Date  . Arthritis   . Heart murmur    refer to cardiologist note from dr Dorris Carnes  . Hyperlipidemia   . Hypertension   . Left ureteral calculus   . Wears dentures    upper  and partial lower  . Wears glasses     Past Surgical History:  Procedure Laterality Date  . CARDIOVASCULAR STRESS TEST  03-31-2012   normal perfusion study/  no ischemia/  ef 69%  . COLONOSCOPY WITH ESOPHAGOGASTRODUODENOSCOPY (EGD)  09-21-2003  . CORNEAL TRANSPLANT Left 2002  . CYSTOSCOPY WITH URETEROSCOPY AND STENT PLACEMENT Left  01/07/2014   Procedure: CYSTOSCOPY WITH LEFT RETRGRADE, URETEROSCOPY , AND LASER LITHOTRIPSY STONE EXTRACTION AND LEFT STENT PLACEMENT;  Surgeon: Ardis Hughs, MD;  Location: [facility];  Service: Urology;  Laterality: Left;  . HOLMIUM LASER APPLICATION Left 09/10/7122   Procedure: HOLMIUM LASER APPLICATION;  Surgeon: Ardis Hughs, MD;  Location: [facility];  Service: Urology;  Laterality: Left;  . REVISION TOTAL KNEE ARTHROPLASTY  left 08-01-2003/  right 02-20-02006  . TOTAL KNEE ARTHROPLASTY  left 03-30-2003/  right 11-05-2004   post op left knee I & D arthroscopic lavage 04-20-2003  . TRANSTHORACIC ECHOCARDIOGRAM  08-28-2011  dr Nevin Bloodgood ross   moderate lvh/  ef 58-09%/  grade 2 diastolic dysfunction/ normal lv function / hyperdynamic lv with outflow murmur / turbulance through LVOT , mild SAM/ moderate lae/ moderate tr/  trivial mv & pv  . VAGINAL HYSTERECTOMY  age 28   w/ bilateral salpingoophorectomy and vesicovaginal fistula repair    Current Medications: Current Meds  Medication Sig  . aspirin 81 MG EC tablet Take 81 mg by mouth daily.    Marland Kitchen atorvastatin (LIPITOR) 40 MG tablet Take 40 mg by mouth every evening.  . Calcium Citrate (CAL-CITRATE PO) Take 1 tablet by mouth daily.   . chlorthalidone (HYGROTON) 25 MG tablet Take 25 mg by mouth daily.  Marland Kitchen EPINEPHrine (EPIPEN JR) 0.15  MG/0.3ML injection Inject 0.15 mg into the muscle as needed.  . ergocalciferol (VITAMIN D2) 50000 UNITS capsule Take 50,000 Units by mouth once a week. Wednesday  . HYDROcodone-acetaminophen (NORCO) 7.5-325 MG tablet Take 1 tablet by mouth 2 (two) times daily.  . metoprolol (LOPRESSOR) 50 MG tablet Take 50 mg by mouth 2 (two) times daily.  . Multiple Vitamins-Minerals (MULTIVITAMIN WITH MINERALS) tablet Take 1 tablet by mouth daily.  . potassium chloride SA (KLOR-CON M20) 20 MEQ tablet Take 1 tablet (20 mEq total) by mouth daily.     Allergies:   Bee venom and  Oxycodone-acetaminophen   Social History   Socioeconomic History  . Marital status: Legally Separated    Spouse name: Not on file  . Number of children: Not on file  . Years of education: Not on file  . Highest education level: Not on file  Occupational History  . Not on file  Social Needs  . Financial resource strain: Not on file  . Food insecurity:    Worry: Not on file    Inability: Not on file  . Transportation needs:    Medical: Not on file    Non-medical: Not on file  Tobacco Use  . Smoking status: Current Some Day Smoker    Packs/day: 0.50    Types: Cigarettes  . Smokeless tobacco: Never Used  . Tobacco comment: per pt occasional smoker  Substance and Sexual Activity  . Alcohol use: Yes    Alcohol/week: 3.0 standard drinks    Types: 3 Glasses of wine per week  . Drug use: No  . Sexual activity: Not on file  Lifestyle  . Physical activity:    Days per week: Not on file    Minutes per session: Not on file  . Stress: Not on file  Relationships  . Social connections:    Talks on phone: Not on file    Gets together: Not on file    Attends religious service: Not on file    Active member of club or organization: Not on file    Attends meetings of clubs or organizations: Not on file    Relationship status: Not on file  Other Topics Concern  . 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 Widowed - husband died 09-14-11. Occasional smoking and Etoh.     Family History:  The patient's family history includes Hypertension in her brother and father.   ROS:   Please see the history of present illness.    Review of Systems  Constitution: Positive for weight loss.  HENT: Negative.   Eyes: Negative.   Cardiovascular: Positive for dyspnea on exertion and irregular heartbeat.  Respiratory: Negative.   Hematologic/Lymphatic: Bruises/bleeds easily.  Musculoskeletal: Negative.  Negative for joint pain.    Gastrointestinal: Negative.   Genitourinary: Negative.   Neurological: Positive for dizziness and loss of balance.   All other systems reviewed and are negative.   PHYSICAL EXAM:   VS:  BP (!) 196/92   Pulse 63   Ht '5\' 4"'  (1.626 m)   Wt 202 lb 12.8 oz (92 kg)   SpO2 97%   BMI 34.81 kg/m   Physical Exam  GEN: Obese, in no acute distress  Neck: no JVD, carotid bruits, or masses Cardiac:RRR; 2/6 to 3/6 systolic murmur at the left sternal border Respiratory:  clear to auscultation bilaterally, normal work of breathing GI: soft, nontender, nondistended, + BS Ext: without cyanosis, clubbing,  or edema, Good distal pulses bilaterally Neuro:  Alert and Oriented x 3 Psych: euthymic mood, full affect  Wt Readings from Last 3 Encounters:  05/27/18 202 lb 12.8 oz (92 kg)  07/24/17 221 lb 6.4 oz (100.4 kg)  08/23/16 239 lb 9.6 oz (108.7 kg)      Studies/Labs Reviewed:   EKG:  EKG is ordered today.  The ekg ordered today demonstrates normal sinus rhythm at 63 bpm.  EKG faxed over from PCP yesterday bradycardia at 46 bpm  Recent Labs: 07/24/2017: BUN 15; Creatinine, Ser 0.89; Hemoglobin 14.2; Platelets 319; Potassium 3.6; Sodium 140; TSH 0.702   Lipid Panel    Component Value Date/Time   CHOL 140 07/24/2017 1307   TRIG 73 07/24/2017 1307   HDL 63 07/24/2017 1307   CHOLHDL 2.2 07/24/2017 1307   CHOLHDL 3 04/12/2013 1030   VLDL 19.6 04/12/2013 1030   LDLCALC 62 07/24/2017 1307   LDLDIRECT 141.4 04/12/2013 1030    Additional studies/ records that were reviewed today include:  2D echo 08/01/2017  Study Conclusions   - Left ventricle: The cavity size was normal. Wall thickness was   increased in a pattern of mild LVH. Systolic function was normal.   The estimated ejection fraction was in the range of 60% to 65%.   Wall motion was normal; there were no regional wall motion   abnormalities. Doppler parameters are consistent with abnormal   left ventricular relaxation (grade 1  diastolic dysfunction). - Aortic valve: Trileaflet; moderately calcified leaflets. There   was mild to moderate stenosis. There was trivial regurgitation.   Mean gradient (S): 20 mm Hg. Valve area (VTI): 1.87 cm^2. - Mitral valve: There was trivial regurgitation. - Left atrium: The atrium was mildly dilated. - Right ventricle: The cavity size was normal. Systolic function   was mildly reduced. - Tricuspid valve: Peak RV-RA gradient (S): 25 mm Hg. - Pulmonary arteries: PA peak pressure: 28 mm Hg (S). - Inferior vena cava: The vessel was normal in size. The   respirophasic diameter changes were in the normal range (>= 50%),   consistent with normal central venous pressure.   Impressions:   - Normal LV size with mild LV hypertrophy. EF 60-65%. Normal RV   size and systolic function. Mild to moderate aortic stenosis   (mean gradient 20 mmHg, AVA 1.87 cm^2).    Nuclear stress test 2/15/2019Nuclear stress EF: 53%.  No T wave inversion was noted during stress.  There was no ST segment deviation noted during stress.  Defect 1: There is a medium defect of moderate severity.  This is a low risk study.   Medium size, moderate severity fixed inferoseptal/septal bowel attenuation artifact. No reversible ischemia. LVEF 53% with normal wall motion. This is a low risk study.     ASSESSMENT:    1. Chest pain, unspecified type   2. Bradycardia   3. Hyperlipidemia, unspecified hyperlipidemia type   4. Moderate aortic stenosis   5. HYPERTENSION, BENIGN      PLAN:  In order of problems listed above:  Bradycardia with heart rate down to 46 at PCP yesterday with associated dizziness blood pressure also extremely high.  Will place monitor to further status.  Decrease metoprolol to 25 mg twice daily  History of chest pain long-standing nuclear stress test 08/01/2017 low risk no ischemia  Essential hypertension blood pressure very high today orthostatics done and not orthostatic.  Will  increase chlorthalidone to 50 mg daily and add amlodipine 5 mg once  a day.  Check be met.  Follow-up with me next week for blood pressure check  Mild to moderate aortic stenosis on echo 08/01/2017 AVA 1.87 cm mean gradient 20 mmHg.  Hyperlipidemia on Lipitor  Medication Adjustments/Labs and Tests Ordered: Current medicines are reviewed at length with the patient today.  Concerns regarding medicines are outlined above.  Medication changes, Labs and Tests ordered today are listed in the Patient Instructions below. There are no Patient Instructions on file for this visit.   Signed, Ermalinda Barrios, PA-C  05/27/2018 1:02 PM    [facility] [address], [place], [place]  [address] Phone: [phone]; Fax: [phone]

## 2018-05-27 NOTE — Telephone Encounter (Signed)
Received call transferred directly from operator and spoke with pt. She reports she saw primary care yesterday for routine check up. EKG was done and heart rate was 44.  Pt states primary care told her cardiology appointment was going to be arranged. Pt is calling to schedule this appointment.   Pt reports dizziness for last 2 weeks. Worse with standing up and walking.  Feels like her equilibrium is off.  Headache comes and goes.  Neck pain is related to changes in position and feels like a "crick." Pt has already taken AM dose of lopressor.  She does not know heart rate now and does not know how to check. I scheduled pt to see Jacolyn ReedyMichele Lenze, PA today at 1:00

## 2018-05-27 NOTE — Patient Instructions (Addendum)
Medication Instructions:  Your physician has recommended you make the following change in your medication:  1.  DECREASE the Lopressor to 25 mg taking 1 tablet by mouth twice a day 2.  INCREASE the Chlorthalidone to 50 mg taking 1 tablet daily 3.  START Amlodipine 5 mg taking 1 tablet daily  If you need a refill on your cardiac medications before your next appointment, please call your pharmacy.   Lab work: TODAY:  BMET  If you have labs (blood work) drawn today and your tests are completely normal, you will receive your results only by: Marland Kitchen. MyChart Message (if you have MyChart) OR . A paper copy in the mail If you have any lab test that is abnormal or we need to change your treatment, we will call you to review the results.  Testing/Procedures: Your physician has recommended that you wear an event monitor. Event monitors are medical devices that record the heart's electrical activity. Doctors most often us these monitors to diagnose arrhythmias. Arrhythmias are problems with the speed or rhythm of the heartbeat. The monitor is a small, portable device. You can wear one while you do your normal daily activities. This is usually used to diagnose what is causing palpitations/syncope (passing out).    Follow-Up: Your physician recommends that you schedule a follow-up appointment in: 06/03/18 ARRIVE AT 1:45 TO SEE MICHELE LENZE, PA-C    Any Other Special Instructions Will Be Listed Below (If Applicable).  DASH Eating Plan DASH stands for "Dietary Approaches to Stop Hypertension." The DASH eating plan is a healthy eating plan that has been shown to reduce high blood pressure (hypertension). It may also reduce your risk for type 2 diabetes, heart disease, and stroke. The DASH eating plan may also help with weight loss. What are tips for following this plan? General guidelines  Avoid eating more than 2,300 mg (milligrams) of salt (sodium) a day. If you have hypertension, you may need to reduce  your sodium intake to 1,500 mg a day.  Limit alcohol intake to no more than 1 drink a day for nonpregnant women and 2 drinks a day for men. One drink equals 12 oz of beer, 5 oz of wine, or 1 oz of hard liquor.  Work with your health care provider to maintain a healthy body weight or to lose weight. Ask what an ideal weight is for you.  Get at least 30 minutes of exercise that causes your heart to beat faster (aerobic exercise) most days of the week. Activities may include walking, swimming, or biking.  Work with your health care provider or diet and nutrition specialist (dietitian) to adjust your eating plan to your individual calorie needs. Reading food labels  Check food labels for the amount of sodium per serving. Choose foods with less than 5 percent of the Daily Value of sodium. Generally, foods with less than 300 mg of sodium per serving fit into this eating plan.  To find whole grains, look for the word "whole" as the first word in the ingredient list. Shopping  Buy products labeled as "low-sodium" or "no salt added."  Buy fresh foods. Avoid canned foods and premade or frozen meals. Cooking  Avoid adding salt when cooking. Use salt-free seasonings or herbs instead of table salt or sea salt. Check with your health care provider or pharmacist before using salt substitutes.  Do not fry foods. Cook foods using healthy methods such as baking, boiling, grilling, and broiling instead.  Cook with heart-healthy oils, such as  olive, canola, soybean, or sunflower oil. Meal planning   Eat a balanced diet that includes: ? 5 or more servings of fruits and vegetables each day. At each meal, try to fill half of your plate with fruits and vegetables. ? Up to 6-8 servings of whole grains each day. ? Less than 6 oz of lean meat, poultry, or fish each day. A 3-oz serving of meat is about the same size as a deck of cards. One egg equals 1 oz. ? 2 servings of low-fat dairy each day. ? A serving  of nuts, seeds, or beans 5 times each week. ? Heart-healthy fats. Healthy fats called Omega-3 fatty acids are found in foods such as flaxseeds and coldwater fish, like sardines, salmon, and mackerel.  Limit how much you eat of the following: ? Canned or prepackaged foods. ? Food that is high in trans fat, such as fried foods. ? Food that is high in saturated fat, such as fatty meat. ? Sweets, desserts, sugary drinks, and other foods with added sugar. ? Full-fat dairy products.  Do not salt foods before eating.  Try to eat at least 2 vegetarian meals each week.  Eat more home-cooked food and less restaurant, buffet, and fast food.  When eating at a restaurant, ask that your food be prepared with less salt or no salt, if possible. What foods are recommended? The items listed may not be a complete list. Talk with your dietitian about what dietary choices are best for you. Grains Whole-grain or whole-wheat bread. Whole-grain or whole-wheat pasta. Brown rice. Orpah Cobb. Bulgur. Whole-grain and low-sodium cereals. Pita bread. Low-fat, low-sodium crackers. Whole-wheat flour tortillas. Vegetables Fresh or frozen vegetables (raw, steamed, roasted, or grilled). Low-sodium or reduced-sodium tomato and vegetable juice. Low-sodium or reduced-sodium tomato sauce and tomato paste. Low-sodium or reduced-sodium canned vegetables. Fruits All fresh, dried, or frozen fruit. Canned fruit in natural juice (without added sugar). Meat and other protein foods Skinless chicken or [place]. Ground chicken or [place]. Pork with fat trimmed off. Fish and seafood. Egg whites. Dried beans, peas, or lentils. Unsalted nuts, nut butters, and seeds. Unsalted canned beans. Lean cuts of beef with fat trimmed off. Low-sodium, lean deli meat. Dairy Low-fat (1%) or fat-free (skim) milk. Fat-free, low-fat, or reduced-fat cheeses. Nonfat, low-sodium ricotta or cottage cheese. Low-fat or nonfat yogurt. Low-fat, low-sodium  cheese. Fats and oils Soft margarine without trans fats. Vegetable oil. Low-fat, reduced-fat, or light mayonnaise and salad dressings (reduced-sodium). Canola, safflower, olive, soybean, and sunflower oils. Avocado. Seasoning and other foods Herbs. Spices. Seasoning mixes without salt. Unsalted popcorn and pretzels. Fat-free sweets. What foods are not recommended? The items listed may not be a complete list. Talk with your dietitian about what dietary choices are best for you. Grains Baked goods made with fat, such as croissants, muffins, or some breads. Dry pasta or rice meal packs. Vegetables Creamed or fried vegetables. Vegetables in a cheese sauce. Regular canned vegetables (not low-sodium or reduced-sodium). Regular canned tomato sauce and paste (not low-sodium or reduced-sodium). Regular tomato and vegetable juice (not low-sodium or reduced-sodium). Rosita Fire. Olives. Fruits Canned fruit in a light or heavy syrup. Fried fruit. Fruit in cream or butter sauce. Meat and other protein foods Fatty cuts of meat. Ribs. Fried meat. Tomasa Blase. Sausage. Bologna and other processed lunch meats. Salami. Fatback. Hotdogs. Bratwurst. Salted nuts and seeds. Canned beans with added salt. Canned or smoked fish. Whole eggs or egg yolks. Chicken or [place] with skin. Dairy Whole or 2% milk, cream, and  half-and-half. Whole or full-fat cream cheese. Whole-fat or sweetened yogurt. Full-fat cheese. Nondairy creamers. Whipped toppings. Processed cheese and cheese spreads. Fats and oils Butter. Stick margarine. Lard. Shortening. Ghee. Bacon fat. Tropical oils, such as coconut, palm kernel, or palm oil. Seasoning and other foods Salted popcorn and pretzels. Onion salt, garlic salt, seasoned salt, table salt, and sea salt. Worcestershire sauce. Tartar sauce. Barbecue sauce. Teriyaki sauce. Soy sauce, including reduced-sodium. Steak sauce. Canned and packaged gravies. Fish sauce. Oyster sauce. Cocktail sauce. Horseradish  that you find on the shelf. Ketchup. Mustard. Meat flavorings and tenderizers. Bouillon cubes. Hot sauce and Tabasco sauce. Premade or packaged marinades. Premade or packaged taco seasonings. Relishes. Regular salad dressings. Where to find more information:  National Heart, Lung, and Blood Institute: PopSteam.is  American Heart Association: www.heart.org Summary  The DASH eating plan is a healthy eating plan that has been shown to reduce high blood pressure (hypertension). It may also reduce your risk for type 2 diabetes, heart disease, and stroke.  With the DASH eating plan, you should limit salt (sodium) intake to 2,300 mg a day. If you have hypertension, you may need to reduce your sodium intake to 1,500 mg a day.  When on the DASH eating plan, aim to eat more fresh fruits and vegetables, whole grains, lean proteins, low-fat dairy, and heart-healthy fats.  Work with your health care provider or diet and nutrition specialist (dietitian) to adjust your eating plan to your individual calorie needs. This information is not intended to replace advice given to you by your health care provider. Make sure you discuss any questions you have with your health care provider. Document Released: 05/23/2011 Document Revised: 05/27/2016 Document Reviewed: 05/27/2016 Elsevier Interactive Patient Education  2018 [employer].       Cardiac Event Monitoring A cardiac event monitor is a small recording device that is used to detect abnormal heart rhythms (arrhythmias). The monitor is used to record your heart rhythm when you have symptoms, such as:  Fast heartbeats (palpitations), such as heart racing or fluttering.  Dizziness.  Fainting or light-headedness.  Unexplained weakness.  Some monitors are wired to electrodes placed on your chest. Electrodes are flat, sticky disks that attach to your skin. Other monitors may be hand-held or worn on the wrist. The monitor can be worn for up to 30  days. If the monitor is attached to your chest, a technician will prepare your chest for the electrode placement and show you how to work the monitor. Take time to practice using the monitor before you leave the office. Make sure you understand how to send the information from the monitor to your health care provider. In some cases, you may need to use a landline telephone instead of a cell phone. What are the risks? Generally, this device is safe to use, but it possible that the skin under the electrodes will become irritated. How to use your cardiac event monitor  Wear your monitor at all times, except when you are in water: ? Do not let the monitor get wet. ? Take the monitor off when you bathe. Do not swim or use a hot tub with it on.  Keep your skin clean. Do not put body lotion or moisturizer on your chest.  Change the electrodes as told by your health care provider or any time they stop sticking to your skin. You may need to use medical tape to keep them on.  Try to put the electrodes in slightly different places on  your chest to help prevent skin irritation. They must remain in the area under your left breast and in the upper right section of your chest.  Make sure the monitor is safely clipped to your clothing or in a location close to your body that your health care provider recommends.  Press the button to record as soon as you feel heart-related symptoms, such as: ? Dizziness. ? Weakness. ? Light-headedness. ? Palpitations. ? Thumping or pounding in your chest. ? Shortness of breath. ? Unexplained weakness.  Keep a diary of your activities, such as walking, doing chores, and taking medicine. It is very important to note what you were doing when you pushed the button to record your symptoms. This will help your health care provider determine what might be contributing to your symptoms.  Send the recorded information as recommended by your health care provider. It may take some  time for your health care provider to process the results.  Change the batteries as told by your health care provider.  Keep electronic devices away from your monitor. This includes: ? Tablets. ? MP3 players. ? Cell phones.  While wearing your monitor you should avoid: ? Electric blankets. ? Firefighter. ? Electric toothbrushes. ? Microwave ovens. ? Magnets. ? Metal detectors. Get help right away if:  You have chest pain.  You have extreme difficulty breathing or shortness of breath.  You develop a very fast heartbeat that persists.  You develop dizziness that does not go away.  You faint or constantly feel like you are about to faint. Summary  A cardiac event monitor is a small recording device that is used to help detect abnormal heart rhythms (arrhythmias).  The monitor is used to record your heart rhythm when you have heart-related symptoms.  Make sure you understand how to send the information from the monitor to your health care provider.  It is important to press the button on the monitor when you have any heart-related symptoms.  Keep a diary of your activities, such as walking, doing chores, and taking medicine. It is very important to note what you were doing when you pushed the button to record your symptoms. This will help your health care provider learn what might be causing your symptoms. This information is not intended to replace advice given to you by your health care provider. Make sure you discuss any questions you have with your health care provider. Document Released: 03/12/2008 Document Revised: 05/18/2016 Document Reviewed: 05/18/2016 Elsevier Interactive Patient Education  2017 [employer].

## 2018-05-27 NOTE — Telephone Encounter (Signed)
New message   Patient c/o Palpitations:  High priority if patient c/o lightheadedness, shortness of breath, or chest pain  1) How long have you had palpitations/irregular HR/ Afib? Are you having the symptoms now? 2 weeks, yes   Are you currently experiencing lightheadedness, SOB or CP?lightheadedness   2) Do you have a history of afib (atrial fibrillation) or irregular heart rhythm?yes   3) Have you checked your BP or HR? (document readings if available):180/80 bp  44hr on yesterday when she was at her primary doctor office , patient states that her hr is still 44 on today.   4) Are you experiencing any other symptoms? Headache, dizziness, tired and pain in neck

## 2018-05-28 LAB — BASIC METABOLIC PANEL
BUN / CREAT RATIO: 23 (ref 12–28)
BUN: 20 mg/dL (ref 8–27)
CALCIUM: 10 mg/dL (ref 8.7–10.3)
CO2: 25 mmol/L (ref 20–29)
Chloride: 105 mmol/L (ref 96–106)
Creatinine, Ser: 0.88 mg/dL (ref 0.57–1.00)
GFR calc Af Amer: 77 mL/min/{1.73_m2} (ref >59)
GFR, EST NON AFRICAN AMERICAN: 67 mL/min/{1.73_m2} (ref >59)
GLUCOSE: 87 mg/dL (ref 65–99)
POTASSIUM: 4 mmol/L (ref 3.5–5.2)
Sodium: 146 mmol/L — ABNORMAL HIGH (ref 134–144)

## 2018-05-29 ENCOUNTER — Telehealth: Payer: Self-pay | Admitting: Physician Assistant

## 2018-05-29 NOTE — Telephone Encounter (Signed)
Returned pts call and left he a detailed message letting her know that we don't have her results back as of yet, and as soon as we got them, that she will be called.  If she had further questions, she call call us back.

## 2018-05-29 NOTE — Telephone Encounter (Signed)
Per pt calling back to get her results  Please give her a call.

## 2018-05-31 ENCOUNTER — Inpatient Hospital Stay (HOSPITAL_COMMUNITY): Payer: PPO

## 2018-05-31 ENCOUNTER — Emergency Department (HOSPITAL_COMMUNITY): Payer: PPO

## 2018-05-31 ENCOUNTER — Inpatient Hospital Stay (HOSPITAL_COMMUNITY)
Admission: EM | Admit: 2018-05-31 | Discharge: 2018-06-01 | DRG: 093 | Disposition: A | Discharge status: Home / Self Care | Payer: PPO | Attending: Internal Medicine | Admitting: Internal Medicine

## 2018-05-31 ENCOUNTER — Other Ambulatory Visit: Payer: Self-pay

## 2018-05-31 ENCOUNTER — Encounter (HOSPITAL_COMMUNITY): Payer: Self-pay | Admitting: Family Medicine

## 2018-05-31 DIAGNOSIS — J432 Centrilobular emphysema: Secondary | ICD-10-CM | POA: Diagnosis not present

## 2018-05-31 DIAGNOSIS — E876 Hypokalemia: Secondary | ICD-10-CM | POA: Diagnosis not present

## 2018-05-31 DIAGNOSIS — I10 Essential (primary) hypertension: Secondary | ICD-10-CM | POA: Diagnosis present

## 2018-05-31 DIAGNOSIS — Z947 Corneal transplant status: Secondary | ICD-10-CM

## 2018-05-31 DIAGNOSIS — Z9842 Cataract extraction status, left eye: Secondary | ICD-10-CM

## 2018-05-31 DIAGNOSIS — I639 Cerebral infarction, unspecified: Secondary | ICD-10-CM | POA: Diagnosis not present

## 2018-05-31 DIAGNOSIS — I6529 Occlusion and stenosis of unspecified carotid artery: Secondary | ICD-10-CM | POA: Diagnosis not present

## 2018-05-31 DIAGNOSIS — Z7982 Long term (current) use of aspirin: Secondary | ICD-10-CM

## 2018-05-31 DIAGNOSIS — Z8249 Family history of ischemic heart disease and other diseases of the circulatory system: Secondary | ICD-10-CM | POA: Diagnosis not present

## 2018-05-31 DIAGNOSIS — I351 Nonrheumatic aortic (valve) insufficiency: Secondary | ICD-10-CM | POA: Diagnosis not present

## 2018-05-31 DIAGNOSIS — R42 Dizziness and giddiness: Secondary | ICD-10-CM | POA: Diagnosis not present

## 2018-05-31 DIAGNOSIS — R079 Chest pain, unspecified: Secondary | ICD-10-CM | POA: Diagnosis not present

## 2018-05-31 DIAGNOSIS — Z87442 Personal history of urinary calculi: Secondary | ICD-10-CM

## 2018-05-31 DIAGNOSIS — R4781 Slurred speech: Principal | ICD-10-CM | POA: Diagnosis present

## 2018-05-31 DIAGNOSIS — I739 Peripheral vascular disease, unspecified: Secondary | ICD-10-CM | POA: Diagnosis present

## 2018-05-31 DIAGNOSIS — I700 Atherosclerosis of aorta: Secondary | ICD-10-CM | POA: Diagnosis present

## 2018-05-31 DIAGNOSIS — E041 Nontoxic single thyroid nodule: Secondary | ICD-10-CM | POA: Diagnosis not present

## 2018-05-31 DIAGNOSIS — R001 Bradycardia, unspecified: Secondary | ICD-10-CM | POA: Diagnosis not present

## 2018-05-31 DIAGNOSIS — R29898 Other symptoms and signs involving the musculoskeletal system: Secondary | ICD-10-CM | POA: Diagnosis not present

## 2018-05-31 DIAGNOSIS — Z720 Tobacco use: Secondary | ICD-10-CM | POA: Diagnosis present

## 2018-05-31 DIAGNOSIS — Z79899 Other long term (current) drug therapy: Secondary | ICD-10-CM

## 2018-05-31 DIAGNOSIS — F17210 Nicotine dependence, cigarettes, uncomplicated: Secondary | ICD-10-CM | POA: Diagnosis present

## 2018-05-31 DIAGNOSIS — R531 Weakness: Secondary | ICD-10-CM | POA: Diagnosis not present

## 2018-05-31 DIAGNOSIS — R55 Syncope and collapse: Secondary | ICD-10-CM | POA: Diagnosis not present

## 2018-05-31 DIAGNOSIS — E785 Hyperlipidemia, unspecified: Secondary | ICD-10-CM | POA: Diagnosis present

## 2018-05-31 DIAGNOSIS — I2510 Atherosclerotic heart disease of native coronary artery without angina pectoris: Secondary | ICD-10-CM | POA: Diagnosis present

## 2018-05-31 DIAGNOSIS — R002 Palpitations: Secondary | ICD-10-CM

## 2018-05-31 DIAGNOSIS — S199XXA Unspecified injury of neck, initial encounter: Secondary | ICD-10-CM | POA: Diagnosis not present

## 2018-05-31 DIAGNOSIS — R4789 Other speech disturbances: Secondary | ICD-10-CM | POA: Diagnosis not present

## 2018-05-31 LAB — DIFFERENTIAL
Abs Immature Granulocytes: 0.02 10*3/uL (ref 0.00–0.07)
BASOS PCT: 0 %
Basophils Absolute: 0 10*3/uL (ref 0.0–0.1)
Eosinophils Absolute: 0.1 10*3/uL (ref 0.0–0.5)
Eosinophils Relative: 1 %
Immature Granulocytes: 0 %
Lymphocytes Relative: 33 %
Lymphs Abs: 3.4 10*3/uL (ref 0.7–4.0)
MONOS PCT: 7 %
Monocytes Absolute: 0.7 10*3/uL (ref 0.1–1.0)
Neutro Abs: 6.1 10*3/uL (ref 1.7–7.7)
Neutrophils Relative %: 59 %

## 2018-05-31 LAB — COMPREHENSIVE METABOLIC PANEL
ALT: 17 U/L (ref 0–44)
AST: 18 U/L (ref 15–41)
Albumin: 4.1 g/dL (ref 3.5–5.0)
Alkaline Phosphatase: 125 U/L (ref 38–126)
Anion gap: 16 — ABNORMAL HIGH (ref 5–15)
BUN: 43 mg/dL — ABNORMAL HIGH (ref 8–23)
CO2: 21 mmol/L — ABNORMAL LOW (ref 22–32)
Calcium: 10 mg/dL (ref 8.9–10.3)
Chloride: 100 mmol/L (ref 98–111)
Creatinine, Ser: 1.02 mg/dL — ABNORMAL HIGH (ref 0.44–1.00)
GFR calc Af Amer: >60 mL/min (ref >60)
GFR calc non Af Amer: 56 mL/min — ABNORMAL LOW (ref >60)
Glucose, Bld: 109 mg/dL — ABNORMAL HIGH (ref 70–99)
POTASSIUM: 4.2 mmol/L (ref 3.5–5.1)
Sodium: 137 mmol/L (ref 135–145)
Total Bilirubin: 1.6 mg/dL — ABNORMAL HIGH (ref 0.3–1.2)
Total Protein: 7.1 g/dL (ref 6.5–8.1)

## 2018-05-31 LAB — I-STAT CHEM 8, ED
BUN: 44 mg/dL — ABNORMAL HIGH (ref 8–23)
CHLORIDE: 104 mmol/L (ref 98–111)
Calcium, Ion: 1.11 mmol/L — ABNORMAL LOW (ref 1.15–1.40)
Creatinine, Ser: 0.9 mg/dL (ref 0.44–1.00)
Glucose, Bld: 106 mg/dL — ABNORMAL HIGH (ref 70–99)
HCT: 44 % (ref 36.0–46.0)
Hemoglobin: 15 g/dL (ref 12.0–15.0)
Potassium: 4.1 mmol/L (ref 3.5–5.1)
Sodium: 136 mmol/L (ref 135–145)
TCO2: 26 mmol/L (ref 22–32)

## 2018-05-31 LAB — CBC
HCT: 46 % (ref 36.0–46.0)
HCT: 42.8 % (ref 36.0–46.0)
Hemoglobin: 14 g/dL (ref 12.0–15.0)
Hemoglobin: 13.3 g/dL (ref 12.0–15.0)
MCH: 28.1 pg (ref 26.0–34.0)
MCH: 27.2 pg (ref 26.0–34.0)
MCHC: 30.4 g/dL (ref 30.0–36.0)
MCHC: 31.1 g/dL (ref 30.0–36.0)
MCV: 92.4 fL (ref 80.0–100.0)
MCV: 87.5 fL (ref 80.0–100.0)
Platelets: 196 10*3/uL (ref 150–400)
Platelets: 184 10*3/uL (ref 150–400)
RBC: 4.98 MIL/uL (ref 3.87–5.11)
RBC: 4.89 MIL/uL (ref 3.87–5.11)
RDW: 14 % (ref 11.5–15.5)
RDW: 13.8 % (ref 11.5–15.5)
WBC: 10.4 10*3/uL (ref 4.0–10.5)
WBC: 8.5 10*3/uL (ref 4.0–10.5)
nRBC: 0 % (ref 0.0–0.2)
nRBC: 0 % (ref 0.0–0.2)

## 2018-05-31 LAB — CREATININE, SERUM
Creatinine, Ser: 0.9 mg/dL (ref 0.44–1.00)
GFR calc Af Amer: >60 mL/min (ref >60)
GFR calc non Af Amer: >60 mL/min (ref >60)

## 2018-05-31 LAB — LIPID PANEL
CHOL/HDL RATIO: 2.3 ratio
Cholesterol: 168 mg/dL (ref 0–200)
HDL: 73 mg/dL (ref >40)
LDL Cholesterol: 81 mg/dL (ref 0–99)
Triglycerides: 69 mg/dL (ref <150)
VLDL: 14 mg/dL (ref 0–40)

## 2018-05-31 LAB — ECHOCARDIOGRAM COMPLETE
Height: 64 in
Weight: 3232 oz

## 2018-05-31 LAB — HIV ANTIBODY (ROUTINE TESTING W REFLEX): HIV Screen 4th Generation wRfx: NONREACTIVE

## 2018-05-31 LAB — I-STAT TROPONIN, ED: Troponin i, poc: 0.01 ng/mL (ref 0.00–0.08)

## 2018-05-31 LAB — HEMOGLOBIN A1C
Hgb A1c MFr Bld: 5.2 % (ref 4.8–5.6)
Mean Plasma Glucose: 102.54 mg/dL

## 2018-05-31 LAB — CBG MONITORING, ED: Glucose-Capillary: 104 mg/dL — ABNORMAL HIGH (ref 70–99)

## 2018-05-31 LAB — TSH: TSH: 0.782 u[IU]/mL (ref 0.350–4.500)

## 2018-05-31 LAB — PROTIME-INR
INR: 1.05
Prothrombin Time: 13.7 seconds (ref 11.4–15.2)

## 2018-05-31 LAB — APTT: aPTT: 27 seconds (ref 24–36)

## 2018-05-31 MED ORDER — SODIUM CHLORIDE 0.9 % IV BOLUS
1000.0000 mL | Freq: Once | INTRAVENOUS | Status: AC
  Administered 2018-05-31: 1000 mL via INTRAVENOUS

## 2018-05-31 MED ORDER — ACETAMINOPHEN 650 MG RE SUPP: 650.0000 mg | RECTAL | Status: DC | PRN

## 2018-05-31 MED ORDER — AMLODIPINE BESYLATE 5 MG PO TABS: 5.0000 mg | ORAL_TABLET | Freq: Every day | ORAL | Status: DC

## 2018-05-31 MED ORDER — ACETAMINOPHEN 325 MG PO TABS: 650.0000 mg | ORAL_TABLET | ORAL | Status: DC | PRN

## 2018-05-31 MED ORDER — IOPAMIDOL (ISOVUE-370) INJECTION 76%
100.0000 mL | Freq: Once | INTRAVENOUS | Status: AC | PRN
  Administered 2018-05-31: 100 mL via INTRAVENOUS

## 2018-05-31 MED ORDER — ATORVASTATIN CALCIUM 40 MG PO TABS
40.0000 mg | ORAL_TABLET | Freq: Every evening | ORAL | Status: DC
  Administered 2018-05-31: 40 mg via ORAL
  Filled 2018-05-31: qty 1

## 2018-05-31 MED ORDER — STROKE: EARLY STAGES OF RECOVERY BOOK
Freq: Once | Status: AC
  Administered 2018-06-01: 11:00:00
  Filled 2018-05-31: qty 1

## 2018-05-31 MED ORDER — SENNOSIDES-DOCUSATE SODIUM 8.6-50 MG PO TABS: 1.0000 | ORAL_TABLET | Freq: Every evening | ORAL | Status: DC | PRN

## 2018-05-31 MED ORDER — METOPROLOL TARTRATE 25 MG PO TABS
25.0000 mg | ORAL_TABLET | Freq: Two times a day (BID) | ORAL | Status: DC
  Administered 2018-05-31 – 2018-06-01 (×2): 25 mg via ORAL
  Filled 2018-05-31 (×3): qty 1

## 2018-05-31 MED ORDER — CHLORTHALIDONE 25 MG PO TABS: 25.0000 mg | ORAL_TABLET | Freq: Every day | ORAL | Status: DC

## 2018-05-31 MED ORDER — IOPAMIDOL (ISOVUE-370) INJECTION 76%
INTRAVENOUS | Status: AC
  Filled 2018-05-31: qty 100

## 2018-05-31 MED ORDER — ASPIRIN 300 MG RE SUPP: 300.0000 mg | Freq: Every day | RECTAL | Status: DC

## 2018-05-31 MED ORDER — ASPIRIN 325 MG PO TABS
325.0000 mg | ORAL_TABLET | Freq: Every day | ORAL | Status: DC
  Administered 2018-05-31 – 2018-06-01 (×2): 325 mg via ORAL
  Filled 2018-05-31 (×2): qty 1

## 2018-05-31 MED ORDER — ACETAMINOPHEN 160 MG/5ML PO SOLN
650.0000 mg | ORAL | Status: DC | PRN
  Filled 2018-05-31: qty 20.3

## 2018-05-31 MED ORDER — HEPARIN SODIUM (PORCINE) 5000 UNIT/ML IJ SOLN
5000.0000 [IU] | Freq: Three times a day (TID) | INTRAMUSCULAR | Status: DC
  Administered 2018-05-31 – 2018-06-01 (×3): 5000 [IU] via SUBCUTANEOUS
  Filled 2018-05-31 (×3): qty 1

## 2018-05-31 NOTE — ED Notes (Signed)
Admitting MD at bedside.

## 2018-05-31 NOTE — ED Notes (Signed)
[facility] called to activate code stroke 

## 2018-05-31 NOTE — Progress Notes (Signed)
  Echocardiogram 2D Echocardiogram has been attempted. Patient not in room.  Adylin Hankey G Terin Dierolf 05/31/2018, 11:24 AM

## 2018-05-31 NOTE — Progress Notes (Signed)
PROGRESS NOTE    KALEEN ROCHETTE  ZOX:[id number] DOB: 11-13-1945 DOA: 05/31/2018 PCP: Caffie Damme, MD   Brief Narrative:  72 year old with history of hypertension, hyperlipidemia, tobacco use came to the hospital with complaints of dizziness and fall.  Prior to that she felt weakness in her right arm and both of her legs.  She admits of having disequilibrium for the past 3 weeks.In the ER she also reported of slurred speech for about 10 minutes.  She was admitted for evaluation of CVA   Assessment & Plan:   Principal Problem:   CVA (cerebral vascular accident) [facility]) Active Problems:   Hyperlipidemia   HYPERTENSION, BENIGN   Tobacco abuse  Slurred speech and dizziness -admit forTelemetry monitoring.  Although MRI of the brain is negative, still need to rule out any cardiac causes. -Stroke protocol -Allow for permissive hypertension for the first 24-48h - only treat PRN if SBP >220 mmHg. Blood pressures can be gradually normalized to SBP<140 upon discharge. -MRI brain without contrast- neg for acute findings -Maintain Euthermia.  -ASA 325 mg ordered.  -Echocardiogram- done, results pending.  -Lipid Panel- LDL 81, TSH - WNL and A1C - 5.2 -Frequent neuro checks -Atorvastatin 40mg  po daily -Risk factor modification -Consulted Neuro -PT/OT eval, Speech consult -Patient has event monitor in place, will touch base with EP tomorrow to see if patient had any evetns.   Essential hypertension - Permissive hypertension, currently on metoprolol 25 mg twice daily  Hyperlipidemia -Continue Lipitor 40 mg daily  Palpitations -Currently has event monitor in place Will speak with EP tomorrow to find out if there was any abnormal rhymtm.   DVT prophylaxis: Heparin Code Status: Full code Family Communication: None Disposition Plan: Continue to monitor on telemetry and currently awaiting further work-up for dizziness.  Consultants:   Neurology  Procedures:    None  Antimicrobials:   None   Subjective: Feels like her dizziness is better.  Still reports of slight right-sided arm weakness.  Review of Systems Otherwise negative except as per HPI, including: General: Denies fever, chills, night sweats or unintended weight loss. Resp: Denies cough, wheezing, shortness of breath. Cardiac: Denies chest pain, palpitations, orthopnea, paroxysmal nocturnal dyspnea. GI: Denies abdominal pain, nausea, vomiting, diarrhea or constipation GU: Denies dysuria, frequency, hesitancy or incontinence MS: Denies muscle aches, joint pain or swelling Neuro: Denies headach Psych: Denies anxiety, depression, SI/HI/AVH Skin: Denies new rashes or lesions ID: Denies sick contacts, exotic exposures, travel  Objective: Vitals:   05/31/18 0548 05/31/18 0745 05/31/18 0945 05/31/18 1255  BP: 126/78 121/64 126/87 (!) 110/55  Pulse: 84     Resp: 16 16 18 18   Temp: 98.2 F (36.8 C)   98.5 F (36.9 C)  TempSrc: Oral   Oral  SpO2:  98% 98% 99%  Weight:      Height:        Intake/Output Summary (Last 24 hours) at 05/31/2018 1350 Last data filed at 05/31/2018 1045 Gross per 24 hour  Intake 1000 ml  Output 600 ml  Net 400 ml   Filed Weights   05/31/18 0234  Weight: 91.6 kg    Examination:  General exam: Appears calm and comfortable  Respiratory system: Clear to auscultation. Respiratory effort normal. Cardiovascular system: S1 & S2 heard, RRR. No JVD, murmurs, rubs, gallops or clicks. No pedal edema. Gastrointestinal system: Abdomen is nondistended, soft and nontender. No organomegaly or masses felt. Normal bowel sounds heard. Central nervous system: Alert and oriented. No focal neurological deficits. Extremities: Symmetric 4+  x 5 power. Skin: No rashes, lesions or ulcers Psychiatry: Judgement and insight appear normal. Mood & affect appropriate.     Data Reviewed:   CBC: Recent Labs  Lab 05/31/18 0239 05/31/18 0246 05/31/18 0900  WBC 10.4   --  8.5  NEUTROABS 6.1  --   --   HGB 14.0 15.0 13.3  HCT 46.0 44.0 42.8  MCV 92.4  --  87.5  PLT 196  --  184   Basic Metabolic Panel: Recent Labs  Lab 05/27/18 1316 05/31/18 0239 05/31/18 0246 05/31/18 0652  NA 146* 137 136  --   K 4.0 4.2 4.1  --   CL 105 100 104  --   CO2 25 21*  --   --   GLUCOSE 87 109* 106*  --   BUN 20 43* 44*  --   CREATININE 0.88 1.02* 0.90 0.90  CALCIUM 10.0 10.0  --   --    GFR: Estimated Creatinine Clearance: 63.8 mL/min (by C-G formula based on SCr of 0.9 mg/dL). Liver Function Tests: Recent Labs  Lab 05/31/18 0239  AST 18  ALT 17  ALKPHOS 125  BILITOT 1.6*  PROT 7.1  ALBUMIN 4.1   No results for input(s): LIPASE, AMYLASE in the last 168 hours. No results for input(s): AMMONIA in the last 168 hours. Coagulation Profile: Recent Labs  Lab 05/31/18 0239  INR 1.05   Cardiac Enzymes: No results for input(s): CKTOTAL, CKMB, CKMBINDEX, TROPONINI in the last 168 hours. BNP (last 3 results) No results for input(s): PROBNP in the last 8760 hours. HbA1C: Recent Labs    05/31/18 0900  HGBA1C 5.2   CBG: Recent Labs  Lab 05/31/18 0237  GLUCAP 104*   Lipid Profile: Recent Labs    05/31/18 0652  CHOL 168  HDL 73  LDLCALC 81  TRIG 69  CHOLHDL 2.3   Thyroid Function Tests: Recent Labs    05/31/18 0900  TSH 0.782   Anemia Panel: No results for input(s): VITAMINB12, FOLATE, FERRITIN, TIBC, IRON, RETICCTPCT in the last 72 hours. Sepsis Labs: No results for input(s): PROCALCITON, LATICACIDVEN in the last 168 hours.  No results found for this or any previous visit (from the past 240 hour(s)).       Radiology Studies: Ct Angio Head W Or Wo Contrast  Result Date: 05/31/2018 CLINICAL DATA:  Slurred speech.  Headache after fall 1 hour ago. EXAM: CT ANGIOGRAPHY HEAD AND NECK TECHNIQUE: Multidetector CT imaging of the head and neck was performed using the standard protocol during bolus administration of intravenous  contrast. Multiplanar CT image reconstructions and MIPs were obtained to evaluate the vascular anatomy. Carotid stenosis measurements (when applicable) are obtained utilizing NASCET criteria, using the distal internal carotid diameter as the denominator. CONTRAST:  ISOVUE-370 IOPAMIDOL (ISOVUE-370) INJECTION 76% COMPARISON:  CT HEAD May 31, 2018 FINDINGS: CTA NECK FINDINGS: AORTIC ARCH: Normal appearance of the thoracic arch, 2 vessel arch is a normal variant. Mild calcific atherosclerosis. The origins of the innominate, left Common carotid artery and subclavian artery are widely patent. RIGHT CAROTID SYSTEM: Common carotid artery is patent. Normal appearance of the carotid bifurcation without hemodynamically significant stenosis by NASCET criteria. Normal appearance of the internal carotid artery. LEFT CAROTID SYSTEM: Common carotid artery is patent. Normal appearance of the carotid bifurcation without hemodynamically significant stenosis by NASCET criteria. Normal appearance of the internal carotid artery. VERTEBRAL ARTERIES:Left vertebral artery is dominant. Normal appearance of the vertebral arteries, widely patent. SKELETON: No acute osseous process  though bone windows have not been submitted. Severe RIGHT C3-4, LEFT C4-5 neural foraminal narrowing. OTHER NECK: Soft tissues of the neck are nonacute though, not tailored for evaluation. 12 mm RIGHT thyroid nodule, below size follow-up recommendation. Punctate calcification LEFT thyroid. UPPER CHEST: Included lung apices are clear. Mild centrilobular emphysema. No superior mediastinal lymphadenopathy. Mild coronary artery calcifications. CTA HEAD FINDINGS: ANTERIOR CIRCULATION: Patent cervical internal carotid arteries, petrous, cavernous and supra clinoid internal carotid arteries. Patent anterior communicating artery. Patent anterior and middle cerebral arteries, mild luminal irregularity compatible with atherosclerosis. No large vessel occlusion,  significant stenosis, contrast extravasation or aneurysm. POSTERIOR CIRCULATION: Patent vertebral arteries, vertebrobasilar junction and basilar artery, as well as main branch vessels. Patent posterior cerebral arteries. No large vessel occlusion, significant stenosis, contrast extravasation or aneurysm. VENOUS SINUSES: Major dural venous sinuses are patent though not tailored for evaluation on this angiographic examination. ANATOMIC VARIANTS: None. DELAYED PHASE: No abnormal intracranial enhancement. MIP images reviewed. IMPRESSION: CTA NECK: 1. No hemodynamically significant stenosis ICA's. 2. Patent vertebral arteries. 3. Severe RIGHT C3-4 and LEFT C4-5 neural foraminal narrowing. CTA HEAD: 1. No emergent large vessel occlusion or flow-limiting stenosis. Emphysema (ICD10-J43.9).  Aortic Atherosclerosis (ICD10-I70.0). Electronically Signed   By: Awilda Metroourtnay  Bloomer M.D.   On: 05/31/2018 04:13   Ct Angio Neck W Or Wo Contrast  Result Date: 05/31/2018 CLINICAL DATA:  Slurred speech.  Headache after fall 1 hour ago. EXAM: CT ANGIOGRAPHY HEAD AND NECK TECHNIQUE: Multidetector CT imaging of the head and neck was performed using the standard protocol during bolus administration of intravenous contrast. Multiplanar CT image reconstructions and MIPs were obtained to evaluate the vascular anatomy. Carotid stenosis measurements (when applicable) are obtained utilizing NASCET criteria, using the distal internal carotid diameter as the denominator. CONTRAST:  100mL ISOVUE-370 IOPAMIDOL (ISOVUE-370) INJECTION 76% COMPARISON:  CT HEAD May 31, 2018 FINDINGS: CTA NECK FINDINGS: AORTIC ARCH: Normal appearance of the thoracic arch, 2 vessel arch is a normal variant. Mild calcific atherosclerosis. The origins of the innominate, left Common carotid artery and subclavian artery are widely patent. RIGHT CAROTID SYSTEM: Common carotid artery is patent. Normal appearance of the carotid bifurcation without hemodynamically  significant stenosis by NASCET criteria. Normal appearance of the internal carotid artery. LEFT CAROTID SYSTEM: Common carotid artery is patent. Normal appearance of the carotid bifurcation without hemodynamically significant stenosis by NASCET criteria. Normal appearance of the internal carotid artery. VERTEBRAL ARTERIES:Left vertebral artery is dominant. Normal appearance of the vertebral arteries, widely patent. SKELETON: No acute osseous process though bone windows have not been submitted. Severe RIGHT C3-4, LEFT C4-5 neural foraminal narrowing. OTHER NECK: Soft tissues of the neck are nonacute though, not tailored for evaluation. 12 mm RIGHT thyroid nodule, below size follow-up recommendation. Punctate calcification LEFT thyroid. UPPER CHEST: Included lung apices are clear. Mild centrilobular emphysema. No superior mediastinal lymphadenopathy. Mild coronary artery calcifications. CTA HEAD FINDINGS: ANTERIOR CIRCULATION: Patent cervical internal carotid arteries, petrous, cavernous and supra clinoid internal carotid arteries. Patent anterior communicating artery. Patent anterior and middle cerebral arteries, mild luminal irregularity compatible with atherosclerosis. No large vessel occlusion, significant stenosis, contrast extravasation or aneurysm. POSTERIOR CIRCULATION: Patent vertebral arteries, vertebrobasilar junction and basilar artery, as well as main branch vessels. Patent posterior cerebral arteries. No large vessel occlusion, significant stenosis, contrast extravasation or aneurysm. VENOUS SINUSES: Major dural venous sinuses are patent though not tailored for evaluation on this angiographic examination. ANATOMIC VARIANTS: None. DELAYED PHASE: No abnormal intracranial enhancement. MIP images reviewed. IMPRESSION: CTA NECK: 1. No hemodynamically significant  stenosis ICA's. 2. Patent vertebral arteries. 3. Severe RIGHT C3-4 and LEFT C4-5 neural foraminal narrowing. CTA HEAD: 1. No emergent large vessel  occlusion or flow-limiting stenosis. Emphysema (ICD10-J43.9).  Aortic Atherosclerosis (ICD10-I70.0). Electronically Signed   By: Awilda Metro M.D.   On: 05/31/2018 04:13   Mr Brain Wo Contrast  Result Date: 05/31/2018 CLINICAL DATA:  RIGHT-sided weakness, last seen normal earlier this morning. History of hypertension, and tobacco abuse. Recent fall. EXAM: MRI HEAD WITHOUT CONTRAST TECHNIQUE: Multiplanar, multiecho pulse sequences of the brain and surrounding structures were obtained without intravenous contrast. COMPARISON:  CT head and CTA head neck earlier today. FINDINGS: The patient was unable to remain motionless for the exam. Small or subtle lesions could be overlooked. Brain: No evidence for acute infarction, hemorrhage, mass lesion, hydrocephalus, or extra-axial fluid. Generalized atrophy. Mild subcortical and periventricular T2 and FLAIR hyperintensities, likely chronic microvascular ischemic change. Vascular: Flow voids are maintained throughout the carotid, basilar, and vertebral arteries. There are no areas of chronic hemorrhage. Skull and upper cervical spine: Unremarkable visualized calvarium, skullbase, and cervical vertebrae. Pituitary, pineal, cerebellar tonsils unremarkable. No upper cervical cord lesions. Sinuses/Orbits: No orbital masses or proptosis. Globes appear symmetric. Sinuses appear well aerated, without evidence for air-fluid level. LEFT cataract extraction. Other: No nasopharyngeal pathology or mastoid fluid. Scalp and other visualized extracranial soft tissues grossly unremarkable. Compared with prior CT head and CTA, good general agreement. IMPRESSION: Motion degraded exam.  Small or subtle lesions could be overlooked. Atrophy and small vessel disease. No visible acute intracranial findings. Electronically Signed   By: Elsie Stain M.D.   On: 05/31/2018 12:27   Ct Head Code Stroke Wo Contrast  Result Date: 05/31/2018 CLINICAL DATA:  Code stroke. RIGHT-sided weakness,  last seen normal at 0215 hours. History of hypertension and hyperlipidemia. EXAM: CT HEAD WITHOUT CONTRAST TECHNIQUE: Contiguous axial images were obtained from the base of the skull through the vertex without intravenous contrast. COMPARISON:  CT HEAD November 29, 2009. FINDINGS: BRAIN: No intraparenchymal hemorrhage, mass effect nor midline shift. The ventricles and sulci are normal for age. Patchy supratentorial white matter hypodensities less than expected for patient's age, though non-specific are most compatible with chronic small vessel ischemic disease. No acute large vascular territory infarcts. No abnormal extra-axial fluid collections. Basal cisterns are patent. VASCULAR: Mild calcific atherosclerosis of the carotid siphons. Equivocal LEFT dense LEFT insular dot sign. SKULL: No skull fracture. No significant scalp soft tissue swelling. LEFT frontal scalp scarring. SINUSES/ORBITS: Paranasal sinuses are well aerated. Mastoid air cells are well aerated.The included ocular globes and orbital contents are non-suspicious. Soft tissue RIGHT external auditory canal most compatible with cerumen. OTHER: None. ASPECTS [facility] Stroke Program Early CT Score) - Ganglionic level infarction (caudate, lentiform nuclei, internal capsule, insula, M1-M3 cortex): 7 - Supraganglionic infarction (M4-M6 cortex): 3 Total score (0-10 with 10 being normal): 10 IMPRESSION: 1. Equivocal LEFT insular dot sign/M2 thromboembolism. 2. ASPECTS is 10. 3. Otherwise negative CT HEAD without contrast for age. 4. Critical Value/emergent results text paged to Dr.Kirkpatrick, Neurology via AMION secure system on 05/31/2018 at 2:51 am, including interpreting physician's phone number. Electronically Signed   By: Awilda Metro M.D.   On: 05/31/2018 02:52        Scheduled Meds: .  stroke: mapping our early stages of recovery book   Does not apply Once  . aspirin  325 mg Oral Daily   Or  . aspirin  300 mg Rectal Daily  . atorvastatin  40  mg Oral QPM  .  heparin  5,000 Units Subcutaneous Q8H  . iopamidol      . metoprolol tartrate  25 mg Oral BID   Continuous Infusions:   LOS: 0 days   Time spent= 35 mins    Davidlee Jeanbaptiste Joline Maxcy, MD [facility] Pager [phone]   If 7PM-7AM, please contact night-coverage [email or web address] Password [email or web address] 05/31/2018, 1:50 PM

## 2018-05-31 NOTE — Progress Notes (Signed)
Pt in MRI... VS and NIH delayed

## 2018-05-31 NOTE — ED Triage Notes (Signed)
Patient states that she fell one hour ago. Presents with slurred speech, HA to left occipital.

## 2018-05-31 NOTE — ED Notes (Signed)
Pt taken to CT.

## 2018-05-31 NOTE — H&P (Addendum)
PCP:   Caffie DammeSmith, Karla, MD   Chief Complaint:  Dizziness and fall  HPI: This is a 72 year old female who presents   She saw her her PCP on Dec 10th. She was sent to see a cardiologist because she was having sharp head pains. Her blood pressure was elevated and her pulse was low at 45BPM. She has a loop recorder placed for the next 30 days.  Tonight she went to the bathroom. She felt dizzy and fell. She hit the back of her head. She had an episode of nausea and vomiting once. She denies any LOC.  Prior to this she has been experiencing weakness in right arm and weakness in both legs. She states her equilibrium was off all within the last 2.5 weeks. She reports headaches on and off also over 2.5 weeks. She denies any confusion.   She denies BOU, nausea, vomiting. She had an episode of diarrhea today also after she fell.  It occurred once and has not repeated.   Here in the ER she started slurring her speech for approx 10 minutes.   History provided by the patient and her son present at bedside.  Review of Systems:  The patient denies anorexia, fever, weight loss, dizziness, head ache, fall, vision loss, decreased hearing, hoarseness, chest pain, syncope, dyspnea on exertion, peripheral edema, balance deficits, hemoptysis, abdominal pain, nausea, vomiting, melena, hematochezia, severe indigestion/heartburn, hematuria, incontinence, genital sores, muscle weakness, suspicious skin lesions, transient blindness, difficulty walking, depression, unusual weight change, abnormal bleeding, enlarged lymph nodes, angioedema, and breast masses.  Past Medical History: Past Medical History:  Diagnosis Date  . Arthritis   . Heart murmur    refer to cardiologist note from dr Dietrich Patespaula ross  . Hyperlipidemia   . Hypertension   . Left ureteral calculus   . Wears dentures    upper  and partial lower  . Wears glasses    Past Surgical History:  Procedure Laterality Date  . CARDIOVASCULAR STRESS TEST   03-31-2012   normal perfusion study/  no ischemia/  ef 69%  . COLONOSCOPY WITH ESOPHAGOGASTRODUODENOSCOPY (EGD)  09-21-2003  . CORNEAL TRANSPLANT Left 2002  . CYSTOSCOPY WITH URETEROSCOPY AND STENT PLACEMENT Left 01/07/2014   Procedure: CYSTOSCOPY WITH LEFT RETRGRADE, URETEROSCOPY , AND LASER LITHOTRIPSY STONE EXTRACTION AND LEFT STENT PLACEMENT;  Surgeon: Crist FatBenjamin W Herrick, MD;  [facility];  Service: Urology;  Laterality: Left;  . HOLMIUM LASER APPLICATION Left 01/07/2014   Procedure: HOLMIUM LASER APPLICATION;  Surgeon: Crist FatBenjamin W Herrick, MD;  [facility];  Service: Urology;  Laterality: Left;  . REVISION TOTAL KNEE ARTHROPLASTY  left 08-01-2003/  right 02-20-02006  . TOTAL KNEE ARTHROPLASTY  left 03-30-2003/  right 11-05-2004   post op left knee I & D arthroscopic lavage 04-20-2003  . TRANSTHORACIC ECHOCARDIOGRAM  08-28-2011  dr Gunnar Fusipaula ross   moderate lvh/  ef 60-65%/  grade 2 diastolic dysfunction/ normal lv function / hyperdynamic lv with outflow murmur / turbulance through LVOT , mild SAM/ moderate lae/ moderate tr/  trivial mv & pv  . VAGINAL HYSTERECTOMY  age 72   w/ bilateral salpingoophorectomy and vesicovaginal fistula repair    Medications: Prior to Admission medications   Medication Sig Start Date End Date Taking? Authorizing Provider  amLODipine (NORVASC) 5 MG tablet Take 1 tablet (5 mg total) by mouth daily. 05/27/18 08/25/18  Dyann KiefLenze, Michele M, PA-C  aspirin 81 MG EC tablet Take 81 mg by mouth daily.  [provider]  atorvastatin (LIPITOR) 40 MG tablet Take 40 mg by mouth every evening. 04/13/13   Pricilla Riffle, MD  Calcium Citrate (CAL-CITRATE PO) Take 1 tablet by mouth daily.     [provider]  chlorthalidone (HYGROTON) 50 MG tablet Take 1 tablet (50 mg total) by mouth daily. 05/27/18   Dyann Kief, PA-C  EPINEPHrine (EPIPEN JR) 0.15 MG/0.3ML injection Inject 0.15 mg into the muscle as needed.     [provider]  ergocalciferol (VITAMIN D2) 50000 UNITS capsule Take 50,000 Units by mouth once a week. Wednesday    [provider]  HYDROcodone-acetaminophen (NORCO) 7.5-325 MG tablet Take 1 tablet by mouth 2 (two) times daily.    [provider]  metoprolol tartrate (LOPRESSOR) 25 MG tablet Take 1 tablet (25 mg total) by mouth 2 (two) times daily. 05/27/18   Dyann Kief, PA-C  Multiple Vitamins-Minerals (MULTIVITAMIN WITH MINERALS) tablet Take 1 tablet by mouth daily.    [provider]  potassium chloride SA (KLOR-CON M20) 20 MEQ tablet Take 1 tablet (20 mEq total) by mouth daily. 08/19/14   Pricilla Riffle, MD    Allergies:   Allergies  Allergen Reactions  . Bee Venom Anaphylaxis    ALL BEE STINGS ALL BEE STINGS ALL BEE STINGS ALL BEE STINGS ALL BEE STINGS   . Oxycodone-Acetaminophen Itching    Social History:  reports that she has been smoking cigarettes. She has been smoking about 0.50 packs per day. She has never used smokeless tobacco. She reports current alcohol use of about 3.0 standard drinks of alcohol per week. She reports that she does not use drugs.  Family History: Family History  Problem Relation Age of Onset  . Hypertension Father   . Hypertension Brother     Physical Exam: Vitals:   05/31/18 0230 05/31/18 0234 05/31/18 0245 05/31/18 0300  BP: 120/80   (!) 107/93  Pulse: 91  88 73  Resp: 18  (!) 24 17  Temp: 97.8 F (36.6 C)     TempSrc: Oral     SpO2: 100%  100% 100%  Weight:  91.6 kg    Height:  5\' 4"  (1.626 m)      General:  Alert and oriented times three, well developed and nourished, no acute distress Eyes: PERRLA, pink conjunctiva, decreased vision in left eye, cataract ENT: Moist oral mucosa, neck supple, no thyromegaly Lungs: clear to ascultation, no wheeze, no crackles, no use of accessory muscles Cardiovascular: regular rate and rhythm, no regurgitation, no gallops, no murmurs. No carotid bruits, no  JVD, patient with an external 30 day monitor left chest wall Abdomen: soft, positive BS, non-tender, non-distended, no organomegaly, not an acute abdomen GU: not examined Neuro: CN II - XII grossly intact, sensation intact Musculoskeletal: strength 5/5 all extremities, no clubbing, cyanosis or edema Skin: no rash, no subcutaneous crepitation, no decubitus Psych: appropriate patient   Labs on Admission:  Recent Labs    05/31/18 0239 05/31/18 0246  NA 137 136  K 4.2 4.1  CL 100 104  CO2 21*  --   GLUCOSE 109* 106*  BUN 43* 44*  CREATININE 1.02* 0.90  CALCIUM 10.0  --    Recent Labs    05/31/18 0239  AST 18  ALT 17  ALKPHOS 125  BILITOT 1.6*  PROT 7.1  ALBUMIN 4.1   No results for input(s): LIPASE, AMYLASE in the last 72 hours. Recent Labs    05/31/18 0239 05/31/18 0246  WBC 10.4  --   NEUTROABS 6.1  --   HGB 14.0 15.0  HCT 46.0 44.0  MCV 92.4  --   PLT 196  --    No results for input(s): CKTOTAL, CKMB, CKMBINDEX, TROPONINI in the last 72 hours. Invalid input(s): POCBNP No results for input(s): DDIMER in the last 72 hours. No results for input(s): HGBA1C in the last 72 hours. No results for input(s): CHOL, HDL, LDLCALC, TRIG, CHOLHDL, LDLDIRECT in the last 72 hours. No results for input(s): TSH, T4TOTAL, T3FREE, THYROIDAB in the last 72 hours.  Invalid input(s): FREET3 No results for input(s): VITAMINB12, FOLATE, FERRITIN, TIBC, IRON, RETICCTPCT in the last 72 hours.  Micro Results: No results found for this or any previous visit (from the past 240 hour(s)).   Radiological Exams on Admission: Ct Head Code Stroke Wo Contrast  Result Date: 05/31/2018 CLINICAL DATA:  Code stroke. RIGHT-sided weakness, last seen normal at 0215 hours. History of hypertension and hyperlipidemia. EXAM: CT HEAD WITHOUT CONTRAST TECHNIQUE: Contiguous axial images were obtained from the base of the skull through the vertex without intravenous contrast. COMPARISON:  CT HEAD November 29, 2009. FINDINGS: BRAIN: No intraparenchymal hemorrhage, mass effect nor midline shift. The ventricles and sulci are normal for age. Patchy supratentorial white matter hypodensities less than expected for patient's age, though non-specific are most compatible with chronic small vessel ischemic disease. No acute large vascular territory infarcts. No abnormal extra-axial fluid collections. Basal cisterns are patent. VASCULAR: Mild calcific atherosclerosis of the carotid siphons. Equivocal LEFT dense LEFT insular dot sign. SKULL: No skull fracture. No significant scalp soft tissue swelling. LEFT frontal scalp scarring. SINUSES/ORBITS: Paranasal sinuses are well aerated. Mastoid air cells are well aerated.The included ocular globes and orbital contents are non-suspicious. Soft tissue RIGHT external auditory canal most compatible with cerumen. OTHER: None. ASPECTS [facility] Stroke Program Early CT Score) - Ganglionic level infarction (caudate, lentiform nuclei, internal capsule, insula, M1-M3 cortex): 7 - Supraganglionic infarction (M4-M6 cortex): 3 Total score (0-10 with 10 being normal): 10 IMPRESSION: 1. Equivocal LEFT insular dot sign/M2 thromboembolism. 2. ASPECTS is 10. 3. Otherwise negative CT HEAD without contrast for age. 4. Critical Value/emergent results text paged to Dr.Kirkpatrick, Neurology via [employer] secure system on 05/31/2018 at 2:51 am, including interpreting physician's phone number. Electronically Signed   By: Awilda Metro M.D.   On: 05/31/2018 02:52    Assessment/Plan Present on Admission: . CVA (cerebral vascular accident) (HCC) -Admit to med telemetry -TIA order set initiated -MRI head, MRA head, vascular ultrasound neck, 2D echo -PT/OT/speech consulted -Aspirin daily -Neurology on board  . Hyperlipidemia -Stable, home meds resumed -Lipid panel in a.m.  . HYPERTENSION, BENIGN -Stable, home meds resumed  Bradycardia -patient with a 30 day external monitor placed by cardiology  12/11  . Tobacco abuse -last smoke nov 20th, duonebs  [place], Cianni Manny 05/31/2018, 3:56 AM

## 2018-05-31 NOTE — Progress Notes (Signed)
STROKE TEAM PROGRESS NOTE   SUBJECTIVE (INTERVAL HISTORY) Her son and daughters are at the bedside.  She recounted HPI with me. She has been having lightheadedness and imbalance intermittently for 3 weeks. She went to see PCP and was referred to Dr. Tenny Craw in cardiology due to HTN and bradycardia. She was given 30 day cardiac event monitoring. She again had lightheadedness and fell yesterday. Was also found some slight slurry speech and right hand clumsy but those all resolved.   OBJECTIVE Vitals:   05/31/18 0330 05/31/18 0548 05/31/18 0745 05/31/18 0945  BP: 113/86 126/78 121/64 126/87  Pulse:  84    Resp: 20 16 16 18   Temp:  98.2 F (36.8 C)    TempSrc:  Oral    SpO2:   98% 98%  Weight:      Height:        CBC:  Recent Labs  Lab 05/31/18 0239 05/31/18 0246 05/31/18 0900  WBC 10.4  --  8.5  NEUTROABS 6.1  --   --   HGB 14.0 15.0 13.3  HCT 46.0 44.0 42.8  MCV 92.4  --  87.5  PLT 196  --  184    Basic Metabolic Panel:  Recent Labs  Lab 05/27/18 1316 05/31/18 0239 05/31/18 0246 05/31/18 0652  NA 146* 137 136  --   K 4.0 4.2 4.1  --   CL 105 100 104  --   CO2 25 21*  --   --   GLUCOSE 87 109* 106*  --   BUN 20 43* 44*  --   CREATININE 0.88 1.02* 0.90 0.90  CALCIUM 10.0 10.0  --   --     Lipid Panel:     Component Value Date/Time   CHOL 168 05/31/2018 0652   CHOL 140 07/24/2017 1307   TRIG 69 05/31/2018 0652   HDL 73 05/31/2018 0652   HDL 63 07/24/2017 1307   CHOLHDL 2.3 05/31/2018 0652   VLDL 14 05/31/2018 0652   LDLCALC 81 05/31/2018 0652   LDLCALC 62 07/24/2017 1307   HgbA1c:  Lab Results  Component Value Date   HGBA1C 5.2 05/31/2018   Urine Drug Screen: No results found for: LABOPIA, COCAINSCRNUR, LABBENZ, AMPHETMU, THCU, LABBARB  Alcohol Level No results found for: ETH  IMAGING  Ct Angio Head W Or Wo Contrast  Result Date: 05/31/2018 CLINICAL DATA:  Slurred speech.  Headache after fall 1 hour ago. EXAM: CT ANGIOGRAPHY HEAD AND NECK TECHNIQUE:  Multidetector CT imaging of the head and neck was performed using the standard protocol during bolus administration of intravenous contrast. Multiplanar CT image reconstructions and MIPs were obtained to evaluate the vascular anatomy. Carotid stenosis measurements (when applicable) are obtained utilizing NASCET criteria, using the distal internal carotid diameter as the denominator. CONTRAST:  ISOVUE-370 IOPAMIDOL (ISOVUE-370) INJECTION 76% COMPARISON:  CT HEAD May 31, 2018 FINDINGS: CTA NECK FINDINGS: AORTIC ARCH: Normal appearance of the thoracic arch, 2 vessel arch is a normal variant. Mild calcific atherosclerosis. The origins of the innominate, left Common carotid artery and subclavian artery are widely patent. RIGHT CAROTID SYSTEM: Common carotid artery is patent. Normal appearance of the carotid bifurcation without hemodynamically significant stenosis by NASCET criteria. Normal appearance of the internal carotid artery. LEFT CAROTID SYSTEM: Common carotid artery is patent. Normal appearance of the carotid bifurcation without hemodynamically significant stenosis by NASCET criteria. Normal appearance of the internal carotid artery. VERTEBRAL ARTERIES:Left vertebral artery is dominant. Normal appearance of the vertebral arteries, widely patent. SKELETON:  No acute osseous process though bone windows have not been submitted. Severe RIGHT C3-4, LEFT C4-5 neural foraminal narrowing. OTHER NECK: Soft tissues of the neck are nonacute though, not tailored for evaluation. 12 mm RIGHT thyroid nodule, below size follow-up recommendation. Punctate calcification LEFT thyroid. UPPER CHEST: Included lung apices are clear. Mild centrilobular emphysema. No superior mediastinal lymphadenopathy. Mild coronary artery calcifications. CTA HEAD FINDINGS: ANTERIOR CIRCULATION: Patent cervical internal carotid arteries, petrous, cavernous and supra clinoid internal carotid arteries. Patent anterior communicating artery.  Patent anterior and middle cerebral arteries, mild luminal irregularity compatible with atherosclerosis. No large vessel occlusion, significant stenosis, contrast extravasation or aneurysm. POSTERIOR CIRCULATION: Patent vertebral arteries, vertebrobasilar junction and basilar artery, as well as main branch vessels. Patent posterior cerebral arteries. No large vessel occlusion, significant stenosis, contrast extravasation or aneurysm. VENOUS SINUSES: Major dural venous sinuses are patent though not tailored for evaluation on this angiographic examination. ANATOMIC VARIANTS: None. DELAYED PHASE: No abnormal intracranial enhancement. MIP images reviewed. IMPRESSION: CTA NECK: 1. No hemodynamically significant stenosis ICA's. 2. Patent vertebral arteries. 3. Severe RIGHT C3-4 and LEFT C4-5 neural foraminal narrowing. CTA HEAD: 1. No emergent large vessel occlusion or flow-limiting stenosis. Emphysema (ICD10-J43.9).  Aortic Atherosclerosis (ICD10-I70.0). Electronically Signed   By: Awilda Metro M.D.   On: 05/31/2018 04:13   Ct Angio Neck W Or Wo Contrast  Result Date: 05/31/2018 CLINICAL DATA:  Slurred speech.  Headache after fall 1 hour ago. EXAM: CT ANGIOGRAPHY HEAD AND NECK TECHNIQUE: Multidetector CT imaging of the head and neck was performed using the standard protocol during bolus administration of intravenous contrast. Multiplanar CT image reconstructions and MIPs were obtained to evaluate the vascular anatomy. Carotid stenosis measurements (when applicable) are obtained utilizing NASCET criteria, using the distal internal carotid diameter as the denominator. CONTRAST:  ISOVUE-370 IOPAMIDOL (ISOVUE-370) INJECTION 76% COMPARISON:  CT HEAD May 31, 2018 FINDINGS: CTA NECK FINDINGS: AORTIC ARCH: Normal appearance of the thoracic arch, 2 vessel arch is a normal variant. Mild calcific atherosclerosis. The origins of the innominate, left Common carotid artery and subclavian artery are widely patent.  RIGHT CAROTID SYSTEM: Common carotid artery is patent. Normal appearance of the carotid bifurcation without hemodynamically significant stenosis by NASCET criteria. Normal appearance of the internal carotid artery. LEFT CAROTID SYSTEM: Common carotid artery is patent. Normal appearance of the carotid bifurcation without hemodynamically significant stenosis by NASCET criteria. Normal appearance of the internal carotid artery. VERTEBRAL ARTERIES:Left vertebral artery is dominant. Normal appearance of the vertebral arteries, widely patent. SKELETON: No acute osseous process though bone windows have not been submitted. Severe RIGHT C3-4, LEFT C4-5 neural foraminal narrowing. OTHER NECK: Soft tissues of the neck are nonacute though, not tailored for evaluation. 12 mm RIGHT thyroid nodule, below size follow-up recommendation. Punctate calcification LEFT thyroid. UPPER CHEST: Included lung apices are clear. Mild centrilobular emphysema. No superior mediastinal lymphadenopathy. Mild coronary artery calcifications. CTA HEAD FINDINGS: ANTERIOR CIRCULATION: Patent cervical internal carotid arteries, petrous, cavernous and supra clinoid internal carotid arteries. Patent anterior communicating artery. Patent anterior and middle cerebral arteries, mild luminal irregularity compatible with atherosclerosis. No large vessel occlusion, significant stenosis, contrast extravasation or aneurysm. POSTERIOR CIRCULATION: Patent vertebral arteries, vertebrobasilar junction and basilar artery, as well as main branch vessels. Patent posterior cerebral arteries. No large vessel occlusion, significant stenosis, contrast extravasation or aneurysm. VENOUS SINUSES: Major dural venous sinuses are patent though not tailored for evaluation on this angiographic examination. ANATOMIC VARIANTS: None. DELAYED PHASE: No abnormal intracranial enhancement. MIP images reviewed. IMPRESSION: CTA NECK:  1. No hemodynamically significant stenosis ICA's. 2. Patent  vertebral arteries. 3. Severe RIGHT C3-4 and LEFT C4-5 neural foraminal narrowing. CTA HEAD: 1. No emergent large vessel occlusion or flow-limiting stenosis. Emphysema (ICD10-J43.9).  Aortic Atherosclerosis (ICD10-I70.0). Electronically Signed   By: Awilda Metroourtnay  Bloomer M.D.   On: 05/31/2018 04:13   Mr Brain Wo Contrast  Result Date: 05/31/2018 CLINICAL DATA:  RIGHT-sided weakness, last seen normal earlier this morning. History of hypertension, and tobacco abuse. Recent fall. EXAM: MRI HEAD WITHOUT CONTRAST TECHNIQUE: Multiplanar, multiecho pulse sequences of the brain and surrounding structures were obtained without intravenous contrast. COMPARISON:  CT head and CTA head neck earlier today. FINDINGS: The patient was unable to remain motionless for the exam. Small or subtle lesions could be overlooked. Brain: No evidence for acute infarction, hemorrhage, mass lesion, hydrocephalus, or extra-axial fluid. Generalized atrophy. Mild subcortical and periventricular T2 and FLAIR hyperintensities, likely chronic microvascular ischemic change. Vascular: Flow voids are maintained throughout the carotid, basilar, and vertebral arteries. There are no areas of chronic hemorrhage. Skull and upper cervical spine: Unremarkable visualized calvarium, skullbase, and cervical vertebrae. Pituitary, pineal, cerebellar tonsils unremarkable. No upper cervical cord lesions. Sinuses/Orbits: No orbital masses or proptosis. Globes appear symmetric. Sinuses appear well aerated, without evidence for air-fluid level. LEFT cataract extraction. Other: No nasopharyngeal pathology or mastoid fluid. Scalp and other visualized extracranial soft tissues grossly unremarkable. Compared with prior CT head and CTA, good general agreement. IMPRESSION: Motion degraded exam.  Small or subtle lesions could be overlooked. Atrophy and small vessel disease. No visible acute intracranial findings. Electronically Signed   By: Elsie StainJohn T Curnes M.D.   On: 05/31/2018  12:27   Ct Head Code Stroke Wo Contrast  Result Date: 05/31/2018 CLINICAL DATA:  Code stroke. RIGHT-sided weakness, last seen normal at 0215 hours. History of hypertension and hyperlipidemia. EXAM: CT HEAD WITHOUT CONTRAST TECHNIQUE: Contiguous axial images were obtained from the base of the skull through the vertex without intravenous contrast. COMPARISON:  CT HEAD November 29, 2009. FINDINGS: BRAIN: No intraparenchymal hemorrhage, mass effect nor midline shift. The ventricles and sulci are normal for age. Patchy supratentorial white matter hypodensities less than expected for patient's age, though non-specific are most compatible with chronic small vessel ischemic disease. No acute large vascular territory infarcts. No abnormal extra-axial fluid collections. Basal cisterns are patent. VASCULAR: Mild calcific atherosclerosis of the carotid siphons. Equivocal LEFT dense LEFT insular dot sign. SKULL: No skull fracture. No significant scalp soft tissue swelling. LEFT frontal scalp scarring. SINUSES/ORBITS: Paranasal sinuses are well aerated. Mastoid air cells are well aerated.The included ocular globes and orbital contents are non-suspicious. Soft tissue RIGHT external auditory canal most compatible with cerumen. OTHER: None. ASPECTS [facility]([place] Stroke Program Early CT Score) - Ganglionic level infarction (caudate, lentiform nuclei, internal capsule, insula, M1-M3 cortex): 7 - Supraganglionic infarction (M4-M6 cortex): 3 Total score (0-10 with 10 being normal): 10 IMPRESSION: 1. Equivocal LEFT insular dot sign/M2 thromboembolism. 2. ASPECTS is 10. 3. Otherwise negative CT HEAD without contrast for age. 4. Critical Value/emergent results text paged to Dr.Kirkpatrick, Neurology via [employer] secure system on 05/31/2018 at 2:51 am, including interpreting physician's phone number. Electronically Signed   By: Awilda Metroourtnay  Bloomer M.D.   On: 05/31/2018 02:52     PHYSICAL EXAM  Temp:  [97.8 F (36.6 C)-98.7 F (37.1 C)] 98.7  F (37.1 C) (12/15 1628) Pulse Rate:  [52-91] 52 (12/15 1628) Resp:  [16-24] 20 (12/15 1628) BP: (107-131)/(55-93) 131/69 (12/15 1628) SpO2:  [96 %-100 %] 96 % (12/15  1628) Weight:  [91.6 kg] 91.6 kg (12/15 0234)  General - Well nourished, well developed, in no apparent distress.  Ophthalmologic - fundi not visualized due to noncooperation.  Cardiovascular - Regular rate and rhythm.  Mental Status -  Level of arousal and orientation to time, place, and person were intact. Language including expression, naming, repetition, comprehension was assessed and found intact.  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  III, IV, VI - Extraocular movements intact. V - Facial sensation intact bilaterally. VII - Facial movement intact bilaterally. VIII - Hearing & vestibular intact bilaterally. X - Palate elevates symmetrically. XI - Chin turning & shoulder shrug intact bilaterally. XII - Tongue protrusion intact.  Motor Strength - The patient's strength was normal in all extremities and pronator drift was absent.  Bulk was normal and fasciculations were absent.   Motor Tone - Muscle tone was assessed at the neck and appendages and was normal.  Reflexes - The patient's reflexes were symmetrical in all extremities and she had no pathological reflexes.  Sensory - Light touch, temperature/pinprick were assessed and were symmetrical.    Coordination - The patient had normal movements in the hands with no ataxia or dysmetria.  Tremor was absent.  Gait and Station - deferred.    ASSESSMENT/PLAN Ms. Kelsey [name] is a 72 y.o. female with history of hypertension, tobacco use, and hyperlipidemia presenting with mild dysarthria, dizziness, disequilibrium, a clumsy right arm and a fall. She did not receive IV t-PA due to late presentation.  Lightheadedness and imbalance intermittent for 3 weeks - no stroke - need to  rule out arrhythmia vs. anxiety  CT head - no acute abnormality  MRI head - no acute infarct  CTA H&N - no significant stenosis  2D Echo EF 55-60%  Follow up with Dr. Tenny Craw for the 30 day cardiac event monitoring  LDL - 81  HgbA1c - 5.2  VTE prophylaxis - [place] Heparin  Diet  - Heart healthy with thin liquids  aspirin 81 mg daily prior to admission, now on aspirin 325 mg daily. Continue on discharge.   Patient counseled to be compliant with her antithrombotic medications  Ongoing aggressive stroke risk factor management  Therapy recommendations:  pending  Disposition:  Pending  Hypertension  Stable . Home meds including amlodipine, HCTZ and metoprolol . Resumed metoprolol this time only, BP stable . Long-term BP goal normotensive . Recommend to check BP at home due to white coat syndrome.  Hyperlipidemia  Lipid lowering medication PTA: Lipitor 40 mg daily  LDL 81, goal < 100  Current lipid lowering medication: Lipitor 40 mg daily  Continue statin at discharge  Other Stroke Risk Factors  Advanced age  Former smoker - quit one month ago  ETOH use, advised to drink no more than 1 alcoholic beverage per day.  Obesity, Body mass index is 34.67 kg/m., recommend weight loss, diet and exercise as appropriate   Other Active Problems  Severe RIGHT C3-4 and LEFT C4-5 neural foraminal narrowing.   Hospital day # 0  Neurology will sign off. Please call with questions. No neuro follow up needed at this time. Thanks for the consult.   Marvel Plan, MD PhD Stroke Neurology 05/31/2018 5:37 PM    To contact Stroke Continuity provider, please refer to [email or web address]. After hours, contact General Neurology

## 2018-05-31 NOTE — Evaluation (Signed)
Physical Therapy Evaluation Patient Details Name: Kelsey [name] MRN: [id number] DOB: [birth year] Today's Date: 05/31/2018   History of Present Illness  72 year old with history of hypertension, hyperlipidemia, tobacco use came to the hospital with complaints of dizziness and fall.  Prior to that she felt weakness in her right arm and both of her legs.  She admits of having disequilibrium for the past 3 weeks.In the ER she also reported of slurred speech for about 10 minutes.  She was admitted for evaluation of CVA. MRI negative.     Clinical Impression  Pt admitted with above diagnosis. Pt currently with functional limitations due to the deficits listed below (see PT Problem List). On eval, pt required min guard assist transfers and ambulation 200 feet without AD. She demonstrates BLE generalized weakness and deficits in balance. Pt will benefit from skilled PT to increase their independence and safety with mobility to allow discharge to the venue listed below.       Follow Up Recommendations Home health PT;Supervision for mobility/OOB    Equipment Recommendations  None recommended by PT    Recommendations for Other Services       Precautions / Restrictions Precautions Precautions: Fall      Mobility  Bed Mobility Overal bed mobility: Modified Independent             General bed mobility comments: +rail, increased time and effort  Transfers Overall transfer level: Needs assistance Equipment used: None Transfers: Sit to/from Stand;Stand Pivot Transfers Sit to Stand: Min guard Stand pivot transfers: Min guard       General transfer comment: min guard for safety, increased time to stabilize initial standing balance  Ambulation/Gait Ambulation/Gait assistance: Min guard Gait Distance (Feet): 200 Feet Assistive device: None Gait Pattern/deviations: Step-through pattern;Decreased stride length;Drifts right/left Gait velocity: decreased Gait velocity  interpretation: 1.31 - 2.62 ft/sec, indicative of limited community ambulator General Gait Details: LOB x 1 but able to self correct. Pt reports feeling like one leg is longer than the other  Stairs            Wheelchair Mobility    Modified Rankin (Stroke Patients Only)       Balance Overall balance assessment: Needs assistance Sitting-balance support: No upper extremity supported;Feet supported Sitting balance-Leahy Scale: Good     Standing balance support: No upper extremity supported;During functional activity Standing balance-Leahy Scale: Fair Standing balance comment: amb without AD                             Pertinent Vitals/Pain Pain Assessment: No/denies pain    Home Living Family/patient expects to be discharged to:: Private residence Living Arrangements: Spouse/significant other Available Help at Discharge: Family;Available 24 hours/day Type of Home: Apartment Home Access: Elevator     Home Layout: One level Home Equipment: None      Prior Function Level of Independence: Independent         Comments: Drives. Grocery shops     Hand Dominance        Extremity/Trunk Assessment   Upper Extremity Assessment Upper Extremity Assessment: Defer to OT evaluation    Lower Extremity Assessment Lower Extremity Assessment: Generalized weakness    Cervical / Trunk Assessment Cervical / Trunk Assessment: Normal  Communication   Communication: No difficulties  Cognition Arousal/Alertness: Awake/alert Behavior During Therapy: WFL for tasks assessed/performed Overall Cognitive Status: Within Functional Limits for tasks assessed  General Comments      Exercises     Assessment/Plan    PT Assessment Patient needs continued PT services  PT Problem List Decreased strength;Decreased balance;Decreased mobility;Decreased activity tolerance       PT Treatment Interventions DME  instruction;Functional mobility training;Balance training;Patient/family education;Gait training;Therapeutic activities;Therapeutic exercise    PT Goals (Current goals can be found in the Care Plan section)  Acute Rehab PT Goals Patient Stated Goal: improve balance PT Goal Formulation: With patient Time For Goal Achievement: 06/14/18 Potential to Achieve Goals: Good    Frequency Min 3X/week   Barriers to discharge        Co-evaluation               AM-PAC PT "6 Clicks" Mobility  Outcome Measure Help needed turning from your back to your side while in a flat bed without using bedrails?: None Help needed moving from lying on your back to sitting on the side of a flat bed without using bedrails?: None Help needed moving to and from a bed to a chair (including a wheelchair)?: A Little Help needed standing up from a chair using your arms (e.g., wheelchair or bedside chair)?: A Little Help needed to walk in hospital room?: A Little Help needed climbing 3-5 steps with a railing? : A Little 6 Click Score: 20    End of Session Equipment Utilized During Treatment: Gait belt Activity Tolerance: Patient tolerated treatment well Patient left: in bed;with call bell/phone within reach;with nursing/sitter in room;with family/visitor present Nurse Communication: Mobility status PT Visit Diagnosis: Unsteadiness on feet (R26.81);Muscle weakness (generalized) (M62.81)    Time: 1355-1410 PT Time Calculation (min) (ACUTE ONLY): 15 min   Charges:   PT Evaluation $PT Eval Low Complexity: 1 Low          Aida RaiderWendy Kolbi Altadonna, PT  Office # [phone] Pager [phone]   Ilda FoilGarrow, Marquitta Persichetti Rene 05/31/2018, 3:05 PM

## 2018-05-31 NOTE — ED Notes (Signed)
Dr. Kirkpatrick at bedside 

## 2018-05-31 NOTE — ED Notes (Signed)
Pt CBG was 104, notified Tina(RN)

## 2018-05-31 NOTE — Consult Note (Signed)
Neurology Consultation Reason for Consult: Dizziness Referring Physician: Rhunette CroftNanavati, A  CC: Slurred speech  History is obtained from: Patient, husband  HPI: Kelsey [name] is a 72 y.o. female with a history of hypertension, hyperlipidemia who has had dizziness and disequilibrium over the past 3 weeks.  She states that she has been feeling lightheaded as well at times and therefore has had a event monitor placed.   Tonight, she was acutely "dizzy" which she describes as a sensation of disequilibrium as well as possibly some lightheadedness.  This caused her to fall.  She has also noticed that her right arm is been clumsy for the past 3 weeks.  When she got to the emergency department, it was noticed that she was slurring her speech very slightly and neither her nor her husband had noticed this before.   LKW: 3 weeks ago tpa given?: no, outside of window  ROS: A 14 point ROS was performed and is negative except as noted in the HPI.   Past Medical History:  Diagnosis Date  . Arthritis   . Heart murmur    refer to cardiologist note from dr Dietrich Patespaula ross  . Hyperlipidemia   . Hypertension   . Left ureteral calculus   . Wears dentures    upper  and partial lower  . Wears glasses      Family History  Problem Relation Age of Onset  . Hypertension Father   . Hypertension Brother      Social History:  reports that she has been smoking cigarettes. She has been smoking about 0.50 packs per day. She has never used smokeless tobacco. She reports current alcohol use of about 3.0 standard drinks of alcohol per week. She reports that she does not use drugs.   Exam: Current vital signs: BP (!) 107/93   Pulse 73   Temp 97.8 F (36.6 C) (Oral)   Resp 17   Ht 5\' 4"  (1.626 m)   Wt 91.6 kg   SpO2 100%   BMI 34.67 kg/m  Vital signs in last 24 hours: Temp:  [97.8 F (36.6 C)] 97.8 F (36.6 C) (12/15 0230) Pulse Rate:  [73-91] 73 (12/15 0300) Resp:  [17-24] 17 (12/15 0300) BP:  (107-120)/(80-93) 107/93 (12/15 0300) SpO2:  [100 %] 100 % (12/15 0300) Weight:  [91.6 kg] 91.6 kg (12/15 0234)   Physical Exam  Constitutional: Appears well-developed and well-nourished.  Psych: Affect appropriate to situation Eyes: No scleral injection HENT: No OP obstrucion Head: Normocephalic.  Cardiovascular: Normal rate and regular rhythm.  Respiratory: Effort normal, non-labored breathing GI: Soft.  No distension. There is no tenderness.  Skin: WDI  Neuro: Mental Status: Patient is awake, alert, oriented to person, place, month, year, and situation. Patient is able to give a clear and coherent history. No signs of aphasia or neglect Cranial Nerves: II: Visual Fields are full. Pupils are equal, round, and reactive to light.   III,IV, VI: EOMI without ptosis or diploplia.  V: Facial sensation is symmetric to temperature VII: Facial movement with mild decreased nasolabial fold on the right VIII: hearing is intact to voice X: Uvula elevates symmetrically XI: Shoulder shrug is symmetric. XII: tongue is midline without atrophy or fasciculations.  Motor: She has good strength in bilateral legs as well as the left arm, and the right arm she has 4+/5 strength with impaired fine motor activity Sensory: Sensation is symmetric to light touch and temperature in the arms and legs. Cerebellar: FNF and HKS are intact on  the left arm and leg and right leg, slowed movements in the right arm  I have reviewed labs in epic and the results pertinent to this consultation are: CMP-unremarkable  I have reviewed the images obtained: No clear acute findings  Impression: 72 year old female with right arm weakness for 3 weeks with worsening disequilibrium/dysarthria.  I suspect that she may have had a small stroke 3 weeks ago with some extension tonight.  Recommendations: - HgbA1c, fasting lipid panel - MRI of the brain without contrast - Frequent neuro checks - Echocardiogram -CTA head  neck - Prophylactic therapy-Antiplatelet med: Aspirin - dose 325mg  PO or 300mg  PR - Risk factor modification - Telemetry monitoring - PT consult, OT consult, Speech consult - Stroke team to follow   Ritta Slot, MD [facility] [phone]  If 7pm- 7am, please page neurology on call as listed in AMION.

## 2018-05-31 NOTE — ED Provider Notes (Addendum)
[facility] Provider Note   CSN: [id number] Arrival date & time: 05/31/18  0217     History   Chief Complaint Chief Complaint  Patient presents with  . Code Stroke    HPI Kelsey [name] is a 72 y.o. female.  HPI   72 year old female comes in with chief complaint of chest pain and slurred speech. According to the patient about an hour and a half ago she got up to go to the bathroom, became dizzy and fell.  She struck her head due to the fall.  She decided to come to the ER because she was having some chest discomfort.  Once patient was triaged, she started having slurred speech and the nursing team alerted me to see the patient immediately.  Patient denies any new numbness, tingling, vision change and actively she has no dizziness.  She does sound slurred to her family member.  She was last normal around 11:30 PM, before she fell down.  Patient is not taking any blood thinners.  She indicates that she has never had a stroke in the past.  Review of system is negative for any neck pain, active chest pain, shortness of breath, palpitations.  Patient's past medical history significant for hypertension, hyperlipidemia.  Past Medical History:  Diagnosis Date  . Arthritis   . Heart murmur    refer to cardiologist note from dr Dietrich Patespaula ross  . Hyperlipidemia   . Hypertension   . Left ureteral calculus   . Wears dentures    upper  and partial lower  . Wears glasses     Patient Active Problem List   Diagnosis Date Noted  . TIA (transient ischemic attack) 06/03/2018  . Dizziness 06/03/2018  . CVA (cerebral vascular accident) (HCC) 05/31/2018  . Bradycardia 05/27/2018  . Moderate aortic stenosis 05/27/2018  . Osteoarthritis of both knees 07/02/2013  . Tobacco abuse 04/01/2012  . 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 Midsternal chest pain 04/01/2012  . CHEST PAIN-UNSPECIFIED 03/09/2010  .  Hyperlipidemia 10/13/2009  . HYPERTENSION, BENIGN 10/13/2009  . UNSPECIFIED TACHYCARDIA 10/13/2009  . DYSPNEA 10/13/2009    Past Surgical History:  Procedure Laterality Date  . CARDIOVASCULAR STRESS TEST  03-31-2012   normal perfusion study/  no ischemia/  ef 69%  . COLONOSCOPY WITH ESOPHAGOGASTRODUODENOSCOPY (EGD)  09-21-2003  . CORNEAL TRANSPLANT Left 2002  . CYSTOSCOPY WITH URETEROSCOPY AND STENT PLACEMENT Left 01/07/2014   Procedure: CYSTOSCOPY WITH LEFT RETRGRADE, URETEROSCOPY , AND LASER LITHOTRIPSY STONE EXTRACTION AND LEFT STENT PLACEMENT;  Surgeon: Crist FatBenjamin W Herrick, MD;  [facility];  Service: Urology;  Laterality: Left;  . HOLMIUM LASER APPLICATION Left 01/07/2014   Procedure: HOLMIUM LASER APPLICATION;  Surgeon: Crist FatBenjamin W Herrick, MD;  [facility];  Service: Urology;  Laterality: Left;  . REVISION TOTAL KNEE ARTHROPLASTY  left 08-01-2003/  right 02-20-02006  . TOTAL KNEE ARTHROPLASTY  left 03-30-2003/  right 11-05-2004   post op left knee I & D arthroscopic lavage 04-20-2003  . TRANSTHORACIC ECHOCARDIOGRAM  08-28-2011  dr Gunnar Fusipaula ross   moderate lvh/  ef 60-65%/  grade 2 diastolic dysfunction/ normal lv function / hyperdynamic lv with outflow murmur / turbulance through LVOT , mild SAM/ moderate lae/ moderate tr/  trivial mv & pv     OB History   No obstetric history on file.      Home Medications    Prior to Admission medications  Medication Sig Start Date End Date Taking? Authorizing Provider  amLODipine (NORVASC) 5 MG tablet Take 1 tablet (5 mg total) by mouth daily. 05/27/18 08/25/18 Yes Dyann Kief, PA-C  atorvastatin (LIPITOR) 40 MG tablet Take 40 mg by mouth every evening. 04/13/13  Yes Pricilla Riffle, MD  Calcium Citrate (CAL-CITRATE PO) Take 1 tablet by mouth daily.    Yes [provider]  EPINEPHrine (EPIPEN JR) 0.15 MG/0.3ML injection Inject 0.15 mg into the muscle as needed for anaphylaxis.    Yes  [provider]  ergocalciferol (VITAMIN D2) 50000 UNITS capsule Take 50,000 Units by mouth every Wednesday.    Yes [provider]  HYDROcodone-acetaminophen (NORCO) 10-325 MG tablet Take 1 tablet by mouth 2 (two) times daily.   Yes [provider]  Multiple Vitamins-Minerals (MULTIVITAMIN WITH MINERALS) tablet Take 1 tablet by mouth daily.   Yes [provider]  potassium chloride SA (KLOR-CON M20) 20 MEQ tablet Take 1 tablet (20 mEq total) by mouth daily. 08/19/14  Yes Pricilla Riffle, MD  aspirin 325 MG tablet Take 1 tablet (325 mg total) by mouth daily. 06/02/18 07/02/18  Amin, Loura Halt, MD  chlorthalidone (HYGROTON) 25 MG tablet Take 1 tablet (25 mg total) by mouth daily. 06/02/18 06/02/19  Pricilla Riffle, MD    Family History Family History  Problem Relation Age of Onset  . Hypertension Father   . Hypertension Brother     Social History Social History   Tobacco Use  . Smoking status: Current Some Day Smoker    Packs/day: 0.50    Types: Cigarettes  . Smokeless tobacco: Never Used  . Tobacco comment: per pt occasional smoker  Substance Use Topics  . Alcohol use: Yes    Alcohol/week: 3.0 standard drinks    Types: 3 Glasses of wine per week  . Drug use: No     Allergies   Bee venom and Oxycodone-acetaminophen   Review of Systems Review of Systems  Constitutional: Positive for activity change.  Eyes: Negative for visual disturbance.  Respiratory: Negative for shortness of breath.   Cardiovascular: Positive for chest pain.  Gastrointestinal: Negative for nausea and vomiting.  Neurological: Positive for dizziness, speech difficulty and light-headedness. Negative for syncope.  Hematological: Does not bruise/bleed easily.  All other systems reviewed and are negative.    Physical Exam Updated Vital Signs BP 123/86 (BP Location: Left Arm)   Pulse 80   Temp 97.8 F (36.6 C) (Oral)   Resp 20   Ht 5\' 4"  (1.626 m)   Wt 91.6 kg    SpO2 100%   BMI 34.67 kg/m   Physical Exam Vitals signs and nursing note reviewed.  Constitutional:      Appearance: She is well-developed.  HENT:     Head: Normocephalic and atraumatic.  Neck:     Musculoskeletal: Normal range of motion and neck supple.  Cardiovascular:     Rate and Rhythm: Normal rate.  Pulmonary:     Effort: Pulmonary effort is normal.  Abdominal:     General: Bowel sounds are normal.  Skin:    General: Skin is warm and dry.  Neurological:     Mental Status: She is alert and oriented to person, place, and time.     Comments: Patient is noted to have dysarthria on my evaluation. Otherwise there is no focal upper or lower extremity weakness, gross sensory deficits and the cerebellar exam did not reveal any dysmetria.      ED Treatments /  Results  Labs (all labs ordered are listed, but only abnormal results are displayed) Labs Reviewed  COMPREHENSIVE METABOLIC PANEL - Abnormal; Notable for the following components:      Result Value   CO2 21 (*)    Glucose, Bld 109 (*)    BUN 43 (*)    Creatinine, Ser 1.02 (*)    Total Bilirubin 1.6 (*)    GFR calc non Af Amer 56 (*)    Anion gap 16 (*)    All other components within normal limits  BASIC METABOLIC PANEL - Abnormal; Notable for the following components:   Potassium 3.2 (*)    Glucose, Bld 103 (*)    BUN 37 (*)    All other components within normal limits  CBG MONITORING, ED - Abnormal; Notable for the following components:   Glucose-Capillary 104 (*)    All other components within normal limits  I-STAT CHEM 8, ED - Abnormal; Notable for the following components:   BUN 44 (*)    Glucose, Bld 106 (*)    Calcium, Ion 1.11 (*)    All other components within normal limits  PROTIME-INR  APTT  CBC  DIFFERENTIAL  HIV ANTIBODY (ROUTINE TESTING W REFLEX)  CREATININE, SERUM  LIPID PANEL  TSH  CBC  HEMOGLOBIN A1C  MAGNESIUM  I-STAT TROPONIN, ED    EKG EKG Interpretation  Date/Time:  Sunday  May 31 2018 02:48:41 EST Ventricular Rate:  79 PR Interval:    QRS Duration: 88 QT Interval:  375 QTC Calculation: 430 R Axis:   19 Text Interpretation:  Sinus rhythm Abnormal R-wave progression, early transition Minimal ST elevation, anterior leads - more proniunced than earler No acute changes No significant change since last tracing Confirmed by Derwood Kaplan [phone]) on 05/31/2018 3:16:45 AM Also confirmed by Derwood Kaplan [phone]), editor Sheppard Evens ([id number])  on 05/31/2018 12:27:20 PM   Radiology No results found.  Procedures .Critical Care Performed by: Derwood Kaplan, MD Authorized by: Derwood Kaplan, MD   Critical care provider statement:    Critical care time (minutes):  35   Critical care time was exclusive of:  Separately billable procedures and treating other patients   Critical care was necessary to treat or prevent imminent or life-threatening deterioration of the following conditions:  CNS failure or compromise   Critical care was time spent personally by me on the following activities:  Discussions with consultants, evaluation of patient's response to treatment, examination of patient, ordering and performing treatments and interventions, ordering and review of laboratory studies, ordering and review of radiographic studies, pulse oximetry, re-evaluation of patient's condition, obtaining history from patient or surrogate and review of old charts   (including critical care time)  Medications Ordered in ED Medications  iopamidol (ISOVUE-370) 76 % injection (has no administration in time range)  sodium chloride 0.9 % bolus 1,000 mL (1,000 mLs Intravenous New Bag/Given 05/31/18 0450)  iopamidol (ISOVUE-370) 76 % injection 100 mL (100 mLs Intravenous Contrast Given 05/31/18 0346)   stroke: mapping our early stages of recovery book ( Does not apply Given 06/01/18 1109)  potassium chloride SA (K-DUR,KLOR-CON) CR tablet 40 mEq (40 mEq Oral Given 06/01/18 1056)      Initial Impression / Assessment and Plan / ED Course  I have reviewed the triage vital signs and the nursing notes.  Pertinent labs & imaging results that were available during my care of the patient were reviewed by me and considered in my medical decision making (see chart for details).  Clinical Course as of Jun 05 922  Wynelle Link May 31, 2018  0322 BUN/creatinine ratio is over 40.  Perhaps her dizziness is orthostatic in nature.   BUN(!): 43 [AN]  0322 Results of the CT scan reviewed.  No active bleeding.  CT HEAD CODE STROKE WO CONTRAST [AN]    Clinical Course User Index [AN] Derwood Kaplan, MD    72 year old female comes in with chief complaint of fall.  Once she was brought into the emergency room, she started having slurred speech. She has history of hypertension and hyperlipidemia.  On exam she is noted to have dysarthria without any other clear focal deficits.  Patient is not having any active chest pain, neck pain, back pain and she is not on any blood thinners.  Differential diagnosis includes hemorrhagic and ischemic stroke. Code stroke was activated. Neurology team has already assessed the patient.  On their exam there were some other subtle findings, however patient is not deemed to be a candidate for TPA for her symptoms.  Appropriate work-up has been initiated and we anticipate admission.  Final Clinical Impressions(s) / ED Diagnoses   Final diagnoses:  Acute ischemic stroke [facility])    ED Discharge Orders         Ordered    aspirin 325 MG tablet  Daily     06/01/18 0810           Derwood Kaplan, MD 05/31/18 1610    Derwood Kaplan, MD 06/05/18 [phone]

## 2018-06-01 ENCOUNTER — Telehealth: Payer: Self-pay | Admitting: Internal Medicine

## 2018-06-01 ENCOUNTER — Inpatient Hospital Stay (HOSPITAL_COMMUNITY): Payer: PPO

## 2018-06-01 DIAGNOSIS — R55 Syncope and collapse: Secondary | ICD-10-CM

## 2018-06-01 DIAGNOSIS — R29898 Other symptoms and signs involving the musculoskeletal system: Secondary | ICD-10-CM

## 2018-06-01 DIAGNOSIS — I10 Essential (primary) hypertension: Secondary | ICD-10-CM

## 2018-06-01 DIAGNOSIS — I350 Nonrheumatic aortic (valve) stenosis: Secondary | ICD-10-CM

## 2018-06-01 LAB — BASIC METABOLIC PANEL
ANION GAP: 12 (ref 5–15)
BUN: 37 mg/dL — ABNORMAL HIGH (ref 8–23)
CO2: 23 mmol/L (ref 22–32)
Calcium: 9.5 mg/dL (ref 8.9–10.3)
Chloride: 104 mmol/L (ref 98–111)
Creatinine, Ser: 0.94 mg/dL (ref 0.44–1.00)
GFR calc Af Amer: >60 mL/min (ref >60)
GFR calc non Af Amer: >60 mL/min (ref >60)
GLUCOSE: 103 mg/dL — AB (ref 70–99)
Potassium: 3.2 mmol/L — ABNORMAL LOW (ref 3.5–5.1)
Sodium: 139 mmol/L (ref 135–145)

## 2018-06-01 LAB — MAGNESIUM: Magnesium: 2 mg/dL (ref 1.7–2.4)

## 2018-06-01 MED ORDER — ASPIRIN 325 MG PO TABS: 325.0000 mg | ORAL_TABLET | Freq: Every day | ORAL | 1 refills | Status: AC

## 2018-06-01 MED ORDER — POTASSIUM CHLORIDE CRYS ER 20 MEQ PO TBCR
40.0000 meq | EXTENDED_RELEASE_TABLET | Freq: Once | ORAL | Status: AC
  Administered 2018-06-01: 40 meq via ORAL
  Filled 2018-06-01: qty 2

## 2018-06-01 NOTE — Progress Notes (Signed)
Dr Nelson ChimesAmin at bedside for assessment. Notified of potassium level.

## 2018-06-01 NOTE — Telephone Encounter (Signed)
Monitor report received from [employer] stating the patient had a 3.3 sec pause on the monitor. They will send over a fax.

## 2018-06-01 NOTE — Progress Notes (Signed)
Reviewed AVS discharge instructions with patient/caregiver. Patient/caregiver verbalizes understanding of instructions received. AVS and prescriptions received by patient/caregiver. If present, telemetry box removed and central cardiac monitoring department notified of discharge. Peripheral IV removed, site benign with tip intact. Patient discharged home with family.

## 2018-06-01 NOTE — Progress Notes (Signed)
Carotid duplex has been completed.   Preliminary results in CV Proc.   Blanch MediaMegan Kylor Valverde 06/01/2018 9:05 AM

## 2018-06-01 NOTE — Telephone Encounter (Signed)
New Message   Dot LanesKrista is calling from [employer] with abnormal EKG

## 2018-06-01 NOTE — Evaluation (Signed)
Occupational Therapy Evaluation Patient Details Name: Kelsey [name] MRN: [id number] DOB: 01-06-1946 Today's Date: 06/01/2018    History of Present Illness 72yo PMH: htn, hyperlipidemia, tobacco use came to the hospital with complaints of dizziness and fall.  Prior to that she felt weakness in her right arm and both of her legs.  She admits of having disequilibrium for the past 3 weeks.In the ER she also reported of slurred speech for about 10 minutes.  She was admitted for evaluation of CVA. MRI negative.    Clinical Impression   Patient evaluated by Occupational Therapy with no further acute OT needs identified. All education has been completed and the patient has no further questions. See below for any follow-up Occupational Therapy or equipment needs. OT to sign off. Thank you for referral.      Follow Up Recommendations  No OT follow up    Equipment Recommendations  None recommended by OT    Recommendations for Other Services       Precautions / Restrictions Precautions Precautions: Fall Precaution Comments: has a monitor on her chest that was present prior to admission per patient  Restrictions Weight Bearing Restrictions: No      Mobility Bed Mobility Overal bed mobility: Modified Independent                Transfers Overall transfer level: Modified independent                    Balance                                           ADL either performed or assessed with clinical judgement   ADL Overall ADL's : Independent                                       General ADL Comments: pt completed shower standing and used hand held shower head, pt able to complete dressing and don of LB socks.      Vision Baseline Vision/History: No visual deficits       Perception     Praxis      Pertinent Vitals/Pain Pain Assessment: No/denies pain     Hand Dominance Left   Extremity/Trunk Assessment Upper  Extremity Assessment Upper Extremity Assessment: Overall WFL for tasks assessed   Lower Extremity Assessment Lower Extremity Assessment: Defer to PT evaluation   Cervical / Trunk Assessment Cervical / Trunk Assessment: Normal   Communication Communication Communication: No difficulties   Cognition Arousal/Alertness: Awake/alert Behavior During Therapy: WFL for tasks assessed/performed Overall Cognitive Status: Within Functional Limits for tasks assessed                                     General Comments       Exercises     Shoulder Instructions      Home Living Family/patient expects to be discharged to:: Private residence Living Arrangements: Spouse/significant other Available Help at Discharge: Family;Available 24 hours/day Type of Home: Apartment Home Access: Elevator     Home Layout: One level     Bathroom Shower/Tub: Chief Strategy OfficerTub/shower unit   Bathroom Toilet: Standard     Home Equipment: None   Additional Comments: x3 family members  at eval       Prior Functioning/Environment Level of Independence: Independent        Comments: Drives. Grocery shops        OT Problem List:        OT Treatment/Interventions:      OT Goals(Current goals can be found in the care plan section) Acute Rehab OT Goals Patient Stated Goal: to go home today OT Goal Formulation: All assessment and education complete, DC therapy Potential to Achieve Goals: Good  OT Frequency:     Barriers to D/C:            Co-evaluation              AM-PAC OT "6 Clicks" Daily Activity     Outcome Measure Help from another person eating meals?: None Help from another person taking care of personal grooming?: None Help from another person toileting, which includes using toliet, bedpan, or urinal?: None Help from another person bathing (including washing, rinsing, drying)?: None Help from another person to put on and taking off regular upper body clothing?:  None Help from another person to put on and taking off regular lower body clothing?: None 6 Click Score: 24   End of Session Nurse Communication: Mobility status;Precautions  Activity Tolerance: Patient tolerated treatment well Patient left: in bed;with call bell/phone within reach;with family/visitor present  OT Visit Diagnosis: Unsteadiness on feet (R26.81)                Time: 1610-9604 OT Time Calculation (min): 19 min Charges:  OT General Charges $OT Visit: 1 Visit OT Evaluation $OT Eval Moderate Complexity: 1 Mod   Mateo Flow, OTR/L  Acute Rehabilitation Services Pager: [phone] Office: [phone] .   Mateo Flow 06/01/2018, 11:16 AM

## 2018-06-01 NOTE — Discharge Summary (Signed)
Physician Discharge Summary  Kelsey [name] ZOX:[id number] DOB: 19-Apr-1946 DOA: 05/31/2018  PCP: Caffie Damme, MD  Admit date: 05/31/2018 Discharge date: 06/01/2018  Admitted From: Home  Disposition:  Home   Recommendations for Outpatient Follow-up:  1. Follow up with PCP in 1-2 weeks 2. Please obtain BMP/CBC in one week your next doctors visit.  3. ASA 325mg  po daily  Discharge Condition: Stable CODE STATUS: Full Diet recommendation: 2g NA  Brief/Interim Summary: 72 year old with history of hypertension, hyperlipidemia, tobacco use came to the hospital with complaints of dizziness and fall.  Prior to that she felt weakness in her right arm and both of her legs.  She admits of having disequilibrium for the past 3 weeks.In the ER she also reported of slurred speech for about 10 minutes.  She was admitted for evaluation of CVA   Discharge Diagnoses:  Principal Problem:   CVA (cerebral vascular accident) [facility]) Active Problems:   Hyperlipidemia   HYPERTENSION, BENIGN   Tobacco abuse  Slurred speech and dizziness; resolved. -MRI of the brain is negative, still need to rule out any cardiac causes. -Allow for permissive hypertension for the first 24-48h - only treat PRN if SBP >220 mmHg. Blood pressures can be gradually normalized to SBP<140 upon discharge. -MRI brain without contrast- neg for acute findings -Maintain Euthermia.  -ASA 325 mg ordered.  -Echocardiogram- 55%, g1DD -Lipid Panel- LDL 81, TSH - WNL and A1C - 5.2 -Frequent neuro checks -Atorvastatin 40mg  po daily -Risk factor modification -Appreciate Neuro input.  -PT/OT eval- HH PT/OT, face-to-face evaluation done.  Arrangements made. -Follow up with outptn Dr Tenny Craw for even monitor.   Hypokalemia -replete K  Essential hypertension - Permissive hypertension, metoprolol 25mg  po bid  Hyperlipidemia -Continue Lipitor 40 mg daily  DVT PPx: Hep SQ Full Code  Discharge today   Discharge  Instructions   Allergies as of 06/01/2018      Reactions   Bee Venom Anaphylaxis   ALL BEE STINGS   Oxycodone-acetaminophen Itching      Medication List    STOP taking these medications   aspirin 81 MG EC tablet Replaced by:  aspirin 325 MG tablet     TAKE these medications   amLODipine 5 MG tablet Commonly known as:  NORVASC Take 1 tablet (5 mg total) by mouth daily.   aspirin 325 MG tablet Take 1 tablet (325 mg total) by mouth daily. Start taking on:  June 02, 2018 Replaces:  aspirin 81 MG EC tablet   atorvastatin 40 MG tablet Commonly known as:  LIPITOR Take 40 mg by mouth every evening.   CAL-CITRATE PO Take 1 tablet by mouth daily.   chlorthalidone 50 MG tablet Commonly known as:  HYGROTON Take 1 tablet (50 mg total) by mouth daily.   EPINEPHrine 0.15 MG/0.3ML injection Commonly known as:  EPIPEN JR Inject 0.15 mg into the muscle as needed for anaphylaxis.   ergocalciferol 1.25 MG (50000 UT) capsule Commonly known as:  VITAMIN D2 Take 50,000 Units by mouth every Wednesday.   HYDROcodone-acetaminophen 10-325 MG tablet Commonly known as:  NORCO Take 1 tablet by mouth 2 (two) times daily.   metoprolol tartrate 25 MG tablet Commonly known as:  LOPRESSOR Take 1 tablet (25 mg total) by mouth 2 (two) times daily.   multivitamin with minerals tablet Take 1 tablet by mouth daily.   potassium chloride SA 20 MEQ tablet Commonly known as:  KLOR-CON M20 Take 1 tablet (20 mEq total) by mouth daily.  Follow-up Information    Caffie Damme, MD. Schedule an appointment as soon as possible for a visit in 1 week(s).   Specialty:  Family Medicine Contact information: [address] [place] [address] [phone]        Pricilla Riffle, MD. Schedule an appointment as soon as possible for a visit.   Specialty:  Cardiology Why:  follow up for event monitor check up.  Contact information: [address] [place]  [address] [phone]        [facility] Follow up.   Specialty:  Home Health Services Why:  They will contact you for the first appointment Contact information: [address] [place] [address] [phone]          Allergies  Allergen Reactions  . Bee Venom Anaphylaxis    ALL BEE STINGS  . Oxycodone-Acetaminophen Itching    You were cared for by a hospitalist during your hospital stay. If you have any questions about your discharge medications or the care you received while you were in the hospital after you are discharged, you can call the unit and asked to speak with the hospitalist on call if the hospitalist that took care of you is not available. Once you are discharged, your primary care physician will handle any further medical issues. Please note that no refills for any discharge medications will be authorized once you are discharged, as it is imperative that you return to your primary care physician (or establish a relationship with a primary care physician if you do not have one) for your aftercare needs so that they can reassess your need for medications and monitor your lab values.  Consultations:  Neurology   Procedures/Studies: Ct Angio Head W Or Wo Contrast  Result Date: 05/31/2018 CLINICAL DATA:  Slurred speech.  Headache after fall 1 hour ago. EXAM: CT ANGIOGRAPHY HEAD AND NECK TECHNIQUE: Multidetector CT imaging of the head and neck was performed using the standard protocol during bolus administration of intravenous contrast. Multiplanar CT image reconstructions and MIPs were obtained to evaluate the vascular anatomy. Carotid stenosis measurements (when applicable) are obtained utilizing NASCET criteria, using the distal internal carotid diameter as the denominator. CONTRAST:  ISOVUE-370 IOPAMIDOL (ISOVUE-370) INJECTION 76% COMPARISON:  CT HEAD May 31, 2018 FINDINGS: CTA NECK FINDINGS: AORTIC ARCH: Normal appearance of the thoracic  arch, 2 vessel arch is a normal variant. Mild calcific atherosclerosis. The origins of the innominate, left Common carotid artery and subclavian artery are widely patent. RIGHT CAROTID SYSTEM: Common carotid artery is patent. Normal appearance of the carotid bifurcation without hemodynamically significant stenosis by NASCET criteria. Normal appearance of the internal carotid artery. LEFT CAROTID SYSTEM: Common carotid artery is patent. Normal appearance of the carotid bifurcation without hemodynamically significant stenosis by NASCET criteria. Normal appearance of the internal carotid artery. VERTEBRAL ARTERIES:Left vertebral artery is dominant. Normal appearance of the vertebral arteries, widely patent. SKELETON: No acute osseous process though bone windows have not been submitted. Severe RIGHT C3-4, LEFT C4-5 neural foraminal narrowing. OTHER NECK: Soft tissues of the neck are nonacute though, not tailored for evaluation. 12 mm RIGHT thyroid nodule, below size follow-up recommendation. Punctate calcification LEFT thyroid. UPPER CHEST: Included lung apices are clear. Mild centrilobular emphysema. No superior mediastinal lymphadenopathy. Mild coronary artery calcifications. CTA HEAD FINDINGS: ANTERIOR CIRCULATION: Patent cervical internal carotid arteries, petrous, cavernous and supra clinoid internal carotid arteries. Patent anterior communicating artery. Patent anterior and middle cerebral arteries, mild  luminal irregularity compatible with atherosclerosis. No large vessel occlusion, significant stenosis, contrast extravasation or aneurysm. POSTERIOR CIRCULATION: Patent vertebral arteries, vertebrobasilar junction and basilar artery, as well as main branch vessels. Patent posterior cerebral arteries. No large vessel occlusion, significant stenosis, contrast extravasation or aneurysm. VENOUS SINUSES: Major dural venous sinuses are patent though not tailored for evaluation on this angiographic examination. ANATOMIC  VARIANTS: None. DELAYED PHASE: No abnormal intracranial enhancement. MIP images reviewed. IMPRESSION: CTA NECK: 1. No hemodynamically significant stenosis ICA's. 2. Patent vertebral arteries. 3. Severe RIGHT C3-4 and LEFT C4-5 neural foraminal narrowing. CTA HEAD: 1. No emergent large vessel occlusion or flow-limiting stenosis. Emphysema (ICD10-J43.9).  Aortic Atherosclerosis (ICD10-I70.0). Electronically Signed   By: Awilda Metro M.D.   On: 05/31/2018 04:13   Ct Angio Neck W Or Wo Contrast  Result Date: 05/31/2018 CLINICAL DATA:  Slurred speech.  Headache after fall 1 hour ago. EXAM: CT ANGIOGRAPHY HEAD AND NECK TECHNIQUE: Multidetector CT imaging of the head and neck was performed using the standard protocol during bolus administration of intravenous contrast. Multiplanar CT image reconstructions and MIPs were obtained to evaluate the vascular anatomy. Carotid stenosis measurements (when applicable) are obtained utilizing NASCET criteria, using the distal internal carotid diameter as the denominator. CONTRAST:  ISOVUE-370 IOPAMIDOL (ISOVUE-370) INJECTION 76% COMPARISON:  CT HEAD May 31, 2018 FINDINGS: CTA NECK FINDINGS: AORTIC ARCH: Normal appearance of the thoracic arch, 2 vessel arch is a normal variant. Mild calcific atherosclerosis. The origins of the innominate, left Common carotid artery and subclavian artery are widely patent. RIGHT CAROTID SYSTEM: Common carotid artery is patent. Normal appearance of the carotid bifurcation without hemodynamically significant stenosis by NASCET criteria. Normal appearance of the internal carotid artery. LEFT CAROTID SYSTEM: Common carotid artery is patent. Normal appearance of the carotid bifurcation without hemodynamically significant stenosis by NASCET criteria. Normal appearance of the internal carotid artery. VERTEBRAL ARTERIES:Left vertebral artery is dominant. Normal appearance of the vertebral arteries, widely patent. SKELETON: No acute osseous  process though bone windows have not been submitted. Severe RIGHT C3-4, LEFT C4-5 neural foraminal narrowing. OTHER NECK: Soft tissues of the neck are nonacute though, not tailored for evaluation. 12 mm RIGHT thyroid nodule, below size follow-up recommendation. Punctate calcification LEFT thyroid. UPPER CHEST: Included lung apices are clear. Mild centrilobular emphysema. No superior mediastinal lymphadenopathy. Mild coronary artery calcifications. CTA HEAD FINDINGS: ANTERIOR CIRCULATION: Patent cervical internal carotid arteries, petrous, cavernous and supra clinoid internal carotid arteries. Patent anterior communicating artery. Patent anterior and middle cerebral arteries, mild luminal irregularity compatible with atherosclerosis. No large vessel occlusion, significant stenosis, contrast extravasation or aneurysm. POSTERIOR CIRCULATION: Patent vertebral arteries, vertebrobasilar junction and basilar artery, as well as main branch vessels. Patent posterior cerebral arteries. No large vessel occlusion, significant stenosis, contrast extravasation or aneurysm. VENOUS SINUSES: Major dural venous sinuses are patent though not tailored for evaluation on this angiographic examination. ANATOMIC VARIANTS: None. DELAYED PHASE: No abnormal intracranial enhancement. MIP images reviewed. IMPRESSION: CTA NECK: 1. No hemodynamically significant stenosis ICA's. 2. Patent vertebral arteries. 3. Severe RIGHT C3-4 and LEFT C4-5 neural foraminal narrowing. CTA HEAD: 1. No emergent large vessel occlusion or flow-limiting stenosis. Emphysema (ICD10-J43.9).  Aortic Atherosclerosis (ICD10-I70.0). Electronically Signed   By: Awilda Metro M.D.   On: 05/31/2018 04:13   Mr Brain Wo Contrast  Result Date: 05/31/2018 CLINICAL DATA:  RIGHT-sided weakness, last seen normal earlier this morning. History of hypertension, and tobacco abuse. Recent fall. EXAM: MRI HEAD WITHOUT CONTRAST TECHNIQUE: Multiplanar, multiecho pulse sequences of  the  brain and surrounding structures were obtained without intravenous contrast. COMPARISON:  CT head and CTA head neck earlier today. FINDINGS: The patient was unable to remain motionless for the exam. Small or subtle lesions could be overlooked. Brain: No evidence for acute infarction, hemorrhage, mass lesion, hydrocephalus, or extra-axial fluid. Generalized atrophy. Mild subcortical and periventricular T2 and FLAIR hyperintensities, likely chronic microvascular ischemic change. Vascular: Flow voids are maintained throughout the carotid, basilar, and vertebral arteries. There are no areas of chronic hemorrhage. Skull and upper cervical spine: Unremarkable visualized calvarium, skullbase, and cervical vertebrae. Pituitary, pineal, cerebellar tonsils unremarkable. No upper cervical cord lesions. Sinuses/Orbits: No orbital masses or proptosis. Globes appear symmetric. Sinuses appear well aerated, without evidence for air-fluid level. LEFT cataract extraction. Other: No nasopharyngeal pathology or mastoid fluid. Scalp and other visualized extracranial soft tissues grossly unremarkable. Compared with prior CT head and CTA, good general agreement. IMPRESSION: Motion degraded exam.  Small or subtle lesions could be overlooked. Atrophy and small vessel disease. No visible acute intracranial findings. Electronically Signed   By: Elsie StainJohn T Curnes M.D.   On: 05/31/2018 12:27   Ct Head Code Stroke Wo Contrast  Result Date: 05/31/2018 CLINICAL DATA:  Code stroke. RIGHT-sided weakness, last seen normal at 0215 hours. History of hypertension and hyperlipidemia. EXAM: CT HEAD WITHOUT CONTRAST TECHNIQUE: Contiguous axial images were obtained from the base of the skull through the vertex without intravenous contrast. COMPARISON:  CT HEAD November 29, 2009. FINDINGS: BRAIN: No intraparenchymal hemorrhage, mass effect nor midline shift. The ventricles and sulci are normal for age. Patchy supratentorial white matter hypodensities less  than expected for patient's age, though non-specific are most compatible with chronic small vessel ischemic disease. No acute large vascular territory infarcts. No abnormal extra-axial fluid collections. Basal cisterns are patent. VASCULAR: Mild calcific atherosclerosis of the carotid siphons. Equivocal LEFT dense LEFT insular dot sign. SKULL: No skull fracture. No significant scalp soft tissue swelling. LEFT frontal scalp scarring. SINUSES/ORBITS: Paranasal sinuses are well aerated. Mastoid air cells are well aerated.The included ocular globes and orbital contents are non-suspicious. Soft tissue RIGHT external auditory canal most compatible with cerumen. OTHER: None. ASPECTS [facility]([place] Stroke Program Early CT Score) - Ganglionic level infarction (caudate, lentiform nuclei, internal capsule, insula, M1-M3 cortex): 7 - Supraganglionic infarction (M4-M6 cortex): 3 Total score (0-10 with 10 being normal): 10 IMPRESSION: 1. Equivocal LEFT insular dot sign/M2 thromboembolism. 2. ASPECTS is 10. 3. Otherwise negative CT HEAD without contrast for age. 4. Critical Value/emergent results text paged to Dr.Kirkpatrick, Neurology via AMION secure system on 05/31/2018 at 2:51 am, including interpreting physician's phone number. Electronically Signed   By: Awilda Metroourtnay  Bloomer M.D.   On: 05/31/2018 02:52   Vas [place] Carotid  Result Date: 06/01/2018 Carotid Arterial Duplex Study Indications: Syncope. Performing Technologist: Blanch MediaMegan Riddle RVS  Examination Guidelines: A complete evaluation includes B-mode imaging, spectral Doppler, color Doppler, and power Doppler as needed of all accessible portions of each vessel. Bilateral testing is considered an integral part of a complete examination. Limited examinations for reoccurring indications may be performed as noted.  Right Carotid Findings: +----------+--------+--------+--------+-----------+--------+           PSV cm/sEDV cm/sStenosisDescribe   Comments  +----------+--------+--------+--------+-----------+--------+ CCA Prox  78      21              homogeneous         +----------+--------+--------+--------+-----------+--------+ CCA Distal63      21  homogeneous         +----------+--------+--------+--------+-----------+--------+ ICA Prox  68      26      1-39%   homogeneous         +----------+--------+--------+--------+-----------+--------+ ICA Distal97      26                                  +----------+--------+--------+--------+-----------+--------+ +----------+--------+-------+--------+-------------------+           PSV cm/sEDV cmsDescribeArm Pressure (mmHG) +----------+--------+-------+--------+-------------------+ [id number]                                         +----------+--------+-------+--------+-------------------+ +---------+--------+--+--------+--+---------+ VertebralPSV cm/s64EDV cm/s18Antegrade +---------+--------+--+--------+--+---------+  Left Carotid Findings: +----------+--------+--------+--------+-----------+--------+           PSV cm/sEDV cm/sStenosisDescribe   Comments +----------+--------+--------+--------+-----------+--------+ CCA Prox  93      13              homogeneous         +----------+--------+--------+--------+-----------+--------+ CCA Distal55      17              homogeneous         +----------+--------+--------+--------+-----------+--------+ ICA Prox  57      18      1-39%   homogeneous         +----------+--------+--------+--------+-----------+--------+ ICA Distal66      29                                  +----------+--------+--------+--------+-----------+--------+ ECA       61      10                                  +----------+--------+--------+--------+-----------+--------+ +----------+--------+--------+--------+-------------------+ SubclavianPSV cm/sEDV cm/sDescribeArm Pressure (mmHG)  +----------+--------+--------+--------+-------------------+           101                                         +----------+--------+--------+--------+-------------------+ +---------+--------+--+--------+--+---------+ VertebralPSV cm/s57EDV cm/s22Antegrade +---------+--------+--+--------+--+---------+  Summary: Right Carotid: Velocities in the right ICA are consistent with a 1-39% stenosis. Left Carotid: Velocities in the left ICA are consistent with a 1-39% stenosis. Vertebrals: Bilateral vertebral arteries demonstrate antegrade flow. *See table(s) above for measurements and observations.     Preliminary       Subjective: Feels better today, wishes to go home.  All the questions have been answered she does not have any further more questions.  General = no fevers, chills, dizziness, malaise, fatigue HEENT/EYES = negative for pain, redness, loss of vision, double vision, blurred vision, loss of hearing, sore throat, hoarseness, dysphagia Cardiovascular= negative for chest pain, palpitation, murmurs, lower extremity swelling Respiratory/lungs= negative for shortness of breath, cough, hemoptysis, wheezing, mucus production Gastrointestinal= negative for nausea, vomiting,, abdominal pain, melena, hematemesis Genitourinary= negative for Dysuria, Hematuria, Change in Urinary Frequency MSK = Negative for arthralgia, myalgias, Back Pain, Joint swelling  Neurology= Negative for headache, seizures, numbness, tingling  Psychiatry= Negative for anxiety, depression, suicidal and homocidal ideation Allergy/Immunology= Medication/Food allergy as listed  Skin= Negative for Rash, lesions, ulcers, itching  Discharge Exam: Vitals:   06/01/18 0426 06/01/18 0831  BP: 127/84 123/86  Pulse: 74 80  Resp: 20 20  Temp: 98.7 F (37.1 C) 97.8 F (36.6 C)  SpO2: 97% 100%   Vitals:   05/31/18 1958 05/31/18 2332 06/01/18 0426 06/01/18 0831  BP: (!) 105/53 112/82 127/84 123/86  Pulse: (!) 56  74  80  Resp: 20 20 20 20   Temp: 98.6 F (37 C) 98.7 F (37.1 C) 98.7 F (37.1 C) 97.8 F (36.6 C)  TempSrc: Oral Oral Oral Oral  SpO2: 100% 98% 97% 100%  Weight:      Height:        General: Pt is alert, awake, not in acute distress Cardiovascular: RRR, S1/S2 +, no rubs, no gallops Respiratory: CTA bilaterally, no wheezing, no rhonchi Abdominal: Soft, NT, ND, bowel sounds + Extremities: no edema, no cyanosis    The results of significant diagnostics from this hospitalization (including imaging, microbiology, ancillary and laboratory) are listed below for reference.     Microbiology: No results found for this or any previous visit (from the past 240 hour(s)).   Labs: BNP (last 3 results) No results for input(s): BNP in the last 8760 hours. Basic Metabolic Panel: Recent Labs  Lab 05/27/18 1316 05/31/18 0239 05/31/18 0246 05/31/18 0652 06/01/18 0340  NA 146* 137 136  --  139  K 4.0 4.2 4.1  --  3.2*  CL 105 100 104  --  104  CO2 25 21*  --   --  23  GLUCOSE 87 109* 106*  --  103*  BUN 20 43* 44*  --  37*  CREATININE 0.88 1.02* 0.90 0.90 0.94  CALCIUM 10.0 10.0  --   --  9.5  MG  --   --   --   --  2.0   Liver Function Tests: Recent Labs  Lab 05/31/18 0239  AST 18  ALT 17  ALKPHOS 125  BILITOT 1.6*  PROT 7.1  ALBUMIN 4.1   No results for input(s): LIPASE, AMYLASE in the last 168 hours. No results for input(s): AMMONIA in the last 168 hours. CBC: Recent Labs  Lab 05/31/18 0239 05/31/18 0246 05/31/18 0900  WBC 10.4  --  8.5  NEUTROABS 6.1  --   --   HGB 14.0 15.0 13.3  HCT 46.0 44.0 42.8  MCV 92.4  --  87.5  PLT 196  --  184   Cardiac Enzymes: No results for input(s): CKTOTAL, CKMB, CKMBINDEX, TROPONINI in the last 168 hours. BNP: Invalid input(s): POCBNP CBG: Recent Labs  Lab 05/31/18 0237  GLUCAP 104*   D-Dimer No results for input(s): DDIMER in the last 72 hours. Hgb A1c Recent Labs    05/31/18 0900  HGBA1C 5.2   Lipid  Profile Recent Labs    05/31/18 0652  CHOL 168  HDL 73  LDLCALC 81  TRIG 69  CHOLHDL 2.3   Thyroid function studies Recent Labs    05/31/18 0900  TSH 0.782   Anemia work up No results for input(s): VITAMINB12, FOLATE, FERRITIN, TIBC, IRON, RETICCTPCT in the last 72 hours. Urinalysis    Component Value Date/Time   COLORURINE YELLOW 12/21/2013 0600   APPEARANCEUR CLEAR 12/21/2013 0600   LABSPEC 1.012 12/21/2013 0600   PHURINE 6.5 12/21/2013 0600   GLUCOSEU 100 (A) 12/21/2013 0600   HGBUR LARGE (A) 12/21/2013 0600   BILIRUBINUR NEGATIVE 12/21/2013 0600   KETONESUR NEGATIVE 12/21/2013 0600   PROTEINUR NEGATIVE 12/21/2013 0600  UROBILINOGEN 0.2 12/21/2013 0600   NITRITE NEGATIVE 12/21/2013 0600   LEUKOCYTESUR SMALL (A) 12/21/2013 0600   Sepsis Labs Invalid input(s): PROCALCITONIN,  WBC,  LACTICIDVEN Microbiology No results found for this or any previous visit (from the past 240 hour(s)).   Time coordinating discharge:  I have spent 35 minutes face to face with the patient and on the ward discussing the patients care, assessment, plan and disposition with other care givers. >50% of the time was devoted counseling the patient about the risks and benefits of treatment/Discharge disposition and coordinating care.   SIGNED:   Dimple Nanas, MD  [facility] 06/01/2018, 10:55 AM Pager   If 7PM-7AM, please contact night-coverage [email or web address] Password [email or web address]

## 2018-06-01 NOTE — Care Management Note (Addendum)
Case Management Note  Patient Details  Name: Kelsey [name] MRN: [id number] Date of Birth: 17-Sep-1945  Subjective/Objective:       Pt in to r/o CVA. She is from home with spouse.  DME: cane, walker--doesn't currently use either Denies issues with obtaining her medications. Denies issues with transportation.             Action/Plan: [facility] home [facility]. CM provided choice and she selected [facility]. Alvino ChapelEllen with [facility] notified and accepted the referral.  P[place] address[place] [address] [place] Pt has transportation home.  Expected Discharge Date:  06/01/18               Expected Discharge Plan:  Home w Home Health Services  In-House Referral:     Discharge planning Services  CM Consult  Post Acute Care Choice:  Home Health Choice offered to:  Patient, Adult Children  DME Arranged:    DME Agency:     HH Arranged:  PT, OT HH Agency:  [facility]  Status of Service:  Completed, signed off  If discussed at Long Length of Stay Meetings, dates discussed:    Additional Comments:  Kermit BaloKelli F Natalynn Pedone, RN 06/01/2018, 11:39 AM

## 2018-06-01 NOTE — Telephone Encounter (Signed)
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 Patient with recent echo and carotid US. Patient also seen in the hospital yesterday for slurred speech and dizziness. MRI of the brain negative. Patient's K- 3.2 and Mg- 2.0 before being discharged today. Patient also has appointment 06/03/18 with Jacolyn ReedyMichele Lenze, PA.   Reviewed monitor with DOD, Dr. Tenny Crawoss. Dr. Tenny Crawoss reviewed with Dr. Johney FrameAllred as well. Will need to find out if patient was having Sx or if she was bearing down. We will continue to monitor for now. Per Dr. Tenny Crawoss, patient will keep appointment with ML on 12/18, patient will decrease chlorthalidone to 25 mg QD and repeat BMET on Friday.   Attempted to contact patient but there was no answer. Will route to triage to follow up with patient tomorrow.

## 2018-06-01 NOTE — Telephone Encounter (Signed)
Error hung up

## 2018-06-02 MED ORDER — CHLORTHALIDONE 25 MG PO TABS: 25.0000 mg | ORAL_TABLET | Freq: Every day | ORAL | 3 refills | Status: DC

## 2018-06-02 NOTE — Telephone Encounter (Signed)
Spoke with the patient, she expressed understanding about decreasing chlorthalidone and a BMET on 12/20. She was in the hospital during the event and she did not remember if she had symptoms during the time.

## 2018-06-03 ENCOUNTER — Ambulatory Visit (INDEPENDENT_AMBULATORY_CARE_PROVIDER_SITE_OTHER): Payer: PPO | Admitting: Physician Assistant

## 2018-06-03 ENCOUNTER — Encounter: Payer: Self-pay | Admitting: Physician Assistant

## 2018-06-03 VITALS — BP 130/78 | HR 58 | Ht 64.0 in | Wt 195.0 lb

## 2018-06-03 DIAGNOSIS — R42 Dizziness and giddiness: Secondary | ICD-10-CM | POA: Diagnosis not present

## 2018-06-03 DIAGNOSIS — I10 Essential (primary) hypertension: Secondary | ICD-10-CM | POA: Diagnosis not present

## 2018-06-03 DIAGNOSIS — R079 Chest pain, unspecified: Secondary | ICD-10-CM

## 2018-06-03 DIAGNOSIS — G459 Transient cerebral ischemic attack, unspecified: Secondary | ICD-10-CM | POA: Insufficient documentation

## 2018-06-03 DIAGNOSIS — I350 Nonrheumatic aortic (valve) stenosis: Secondary | ICD-10-CM | POA: Diagnosis not present

## 2018-06-03 NOTE — Patient Instructions (Addendum)
Medication Instructions:  Your physician has recommended you make the following change in your medication:  1.) stop metoprolol tartrate (Lopressor)  If you need a refill on your cardiac medications before your next appointment, please call your pharmacy.   Lab work: Today: BMET If you have labs (blood work) drawn today and your tests are completely normal, you will receive your results only by: Marland Kitchen. MyChart Message (if you have MyChart) OR . A paper copy in the mail If you have any lab test that is abnormal or we need to change your treatment, we will call you to review the results.  Testing/Procedures: none  Follow-Up: Your physician recommends that you schedule a follow-up appointment in: January, 2020.  We will contact you to arrange this appointment with Dr. Tenny Crawoss.   Any Other Special Instructions Will Be Listed Below (If Applicable).

## 2018-06-03 NOTE — Progress Notes (Signed)
Cardiology Office Note    Date:  06/03/2018   ID:  Kelsey [name], DOB [birth year], MRN [id number]  PCP:  Caffie DammeSmith, Karla, MD  Cardiologist: Dietrich PatesPaula Ross, MD EPS: None  Chief Complaint  Patient presents with  . PT OFFERS NO COMPLAINTS TODAY    History of Present Illness:  Kelsey [name] is a 72 y.o. female with history of chest pain negative Myoview in the past and echo with dynamic outflow obstruction. Also has history of hypertension and mild aortic stenosis.   Last saw Dr. Tenny Crawoss 07/24/2017 at which time she complained of being tired all the time giving out when she goes up the stairs.  2D echo 08/01/2017 normal LVEF 60 to 65% with mild LVH, grade 1 DD, mild to moderate aortic stenosis mean gradient 20 mmHg, AVA 1.87 cm, left atrium mildly dilated.  Myoview LVEF 53% with a medium size moderate severity fixed inferior septal and septal bowel attenuation artifact.  No reversible ischemia, low risk study.   I saw the patient 05/27/2018 because of 2 to 3-week history of dizziness and EKG at primary care showed heart rate of 44.  Also had lost 19 pounds since February.  I decrease patient's metoprolol to 25 mg twice daily.  Placed on monitor.  Patient's blood pressure was quite high and was not orthostatic.  I increase chlorthalidone to 50 mg daily and added amlodipine 5 mg daily.  Patient was then hospitalized with  what was thought to be a TIA vs arrhythmia 05/31/2018 CT and MRI was negative.  Patient went to the emergency room after getting up to go to the bathroom and became dizzy and passed out.  Echo normal LVEF 55% with grade 1 DD, permissive hypertension recommended and gradually treat to normalize BP less than 140.  Patient's monitor showed type 1 second-degree AV block and 3.3-second pause on 05/31/2018 at 10:30 AM which was the day of her hospitalization.  Dr. Tenny Crawoss reviewed with Dr. Johney FrameAllred and chlorthalidone decreased to 25 mg daily.  Patient here with husband and son. She says she  got up to go to BR and legs became weak and she fell. She denies dizziness or syncope. Patient was sleeping in the hospital at 10:33 am when she had 3.3 second pause Holter according to her son and husband.  Hospital monitor had fallen off at that time.  No documented arrhythmias on Holter at time of fall around midnight.  No symptoms since she has been home from the hospital.  Past Medical History:  Diagnosis Date  . Arthritis   . Heart murmur    refer to cardiologist note from dr Dietrich Patespaula ross  . Hyperlipidemia   . Hypertension   . Left ureteral calculus   . Wears dentures    upper  and partial lower  . Wears glasses     Past Surgical History:  Procedure Laterality Date  . CARDIOVASCULAR STRESS TEST  03-31-2012   normal perfusion study/  no ischemia/  ef 69%  . COLONOSCOPY WITH ESOPHAGOGASTRODUODENOSCOPY (EGD)  09-21-2003  . CORNEAL TRANSPLANT Left 2002  . CYSTOSCOPY WITH URETEROSCOPY AND STENT PLACEMENT Left 01/07/2014   Procedure: CYSTOSCOPY WITH LEFT RETRGRADE, URETEROSCOPY , AND LASER LITHOTRIPSY STONE EXTRACTION AND LEFT STENT PLACEMENT;  Surgeon: Crist FatBenjamin W Herrick, MD;  [facility];  Service: Urology;  Laterality: Left;  . HOLMIUM LASER APPLICATION Left 01/07/2014   Procedure: HOLMIUM LASER APPLICATION;  Surgeon: Crist FatBenjamin W Herrick, MD;  [facility];  Service:  Urology;  Laterality: Left;  . REVISION TOTAL KNEE ARTHROPLASTY  left 08-01-2003/  right 02-20-02006  . TOTAL KNEE ARTHROPLASTY  left 03-30-2003/  right 11-05-2004   post op left knee I & D arthroscopic lavage 04-20-2003  . TRANSTHORACIC ECHOCARDIOGRAM  08-28-2011  dr Gunnar Fusi ross   moderate lvh/  ef 60-65%/  grade 2 diastolic dysfunction/ normal lv function / hyperdynamic lv with outflow murmur / turbulance through LVOT , mild SAM/ moderate lae/ moderate tr/  trivial mv & pv    Current Medications: Current Meds  Medication Sig  . amLODipine (NORVASC) 5 MG tablet Take 1  tablet (5 mg total) by mouth daily.  Marland Kitchen aspirin 325 MG tablet Take 1 tablet (325 mg total) by mouth daily.  Marland Kitchen atorvastatin (LIPITOR) 40 MG tablet Take 40 mg by mouth every evening.  . Calcium Citrate (CAL-CITRATE PO) Take 1 tablet by mouth daily.   . chlorthalidone (HYGROTON) 25 MG tablet Take 1 tablet (25 mg total) by mouth daily.  Marland Kitchen EPINEPHrine (EPIPEN JR) 0.15 MG/0.3ML injection Inject 0.15 mg into the muscle as needed for anaphylaxis.   Marland Kitchen ergocalciferol (VITAMIN D2) 50000 UNITS capsule Take 50,000 Units by mouth every Nov 18, 2022.   Marland Kitchen HYDROcodone-acetaminophen (NORCO) 10-325 MG tablet Take 1 tablet by mouth 2 (two) times daily.  . Multiple Vitamins-Minerals (MULTIVITAMIN WITH MINERALS) tablet Take 1 tablet by mouth daily.  . potassium chloride SA (KLOR-CON M20) 20 MEQ tablet Take 1 tablet (20 mEq total) by mouth daily.  . [DISCONTINUED] metoprolol tartrate (LOPRESSOR) 25 MG tablet Take 1 tablet (25 mg total) by mouth 2 (two) times daily.     Allergies:   Bee venom and Oxycodone-acetaminophen   Social History   Socioeconomic History  . Marital status: Legally Separated    Spouse name: Not on file  . Number of children: Not on file  . Years of education: Not on file  . Highest education level: Not on file  Occupational History  . Not on file  Social Needs  . Financial resource strain: Not on file  . Food insecurity:    Worry: Not on file    Inability: Not on file  . Transportation needs:    Medical: Not on file    Non-medical: Not on file  Tobacco Use  . Smoking status: Current Some Day Smoker    Packs/day: 0.50    Types: Cigarettes  . Smokeless tobacco: Never Used  . Tobacco comment: per pt occasional smoker  Substance and Sexual Activity  . Alcohol use: Yes    Alcohol/week: 3.0 standard drinks    Types: 3 Glasses of wine per week  . Drug use: No  . Sexual activity: Not on file  Lifestyle  . Physical activity:    Days per week: Not on file    Minutes per session: Not  on file  . Stress: Not on file  Relationships  . Social connections:    Talks on phone: Not on file    Gets together: Not on file    Attends religious service: Not on file    Active member of club or organization: Not on file    Attends meetings of clubs or organizations: Not on file    Relationship status: Not on file  Other Topics Concern  . 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 Occasional smoking and Etoh.     Family History:  The patient's family  history includes Hypertension in her brother and father.   ROS:   Please see the history of present illness.    Review of Systems  Constitution: Negative.  HENT: Negative.   Eyes: Negative.   Cardiovascular: Negative.   Respiratory: Negative.   Hematologic/Lymphatic: Bruises/bleeds easily.  Musculoskeletal: Negative.  Negative for joint pain.  Gastrointestinal: Negative.   Genitourinary: Negative.   Neurological: Positive for dizziness.   All other systems reviewed and are negative.   PHYSICAL EXAM:   VS:  BP 130/78   Pulse (!) 58   Ht 5\' 4"  (1.626 m)   Wt 195 lb (88.5 kg)   BMI 33.47 kg/m   Physical Exam  GEN: Well nourished, well developed, in no acute distress  Neck: no JVD, carotid bruits, or masses Cardiac:RRR; 2/6 systolic murmur at left sternal border Respiratory:  clear to auscultation bilaterally, normal work of breathing GI: soft, nontender, nondistended, + BS Ext: without cyanosis, clubbing, or edema, Good distal pulses bilaterally Neuro:  Alert and Oriented x 3 Psych: euthymic mood, full affect  Wt Readings from Last 3 Encounters:  06/03/18 195 lb (88.5 kg)  05/31/18 202 lb (91.6 kg)  05/27/18 202 lb 12.8 oz (92 kg)      Studies/Labs Reviewed:   EKG:  EKG is not ordered today.    Recent Labs: 05/31/2018: ALT 17; Hemoglobin 13.3; Platelets 184; TSH 0.782 06/01/2018: BUN 37; Creatinine, Ser 0.94; Magnesium  2.0; Potassium 3.2; Sodium 139   Lipid Panel    Component Value Date/Time   CHOL 168 05/31/2018 0652   CHOL 140 07/24/2017 1307   TRIG 69 05/31/2018 0652   HDL 73 05/31/2018 0652   HDL 63 07/24/2017 1307   CHOLHDL 2.3 05/31/2018 0652   VLDL 14 05/31/2018 0652   LDLCALC 81 05/31/2018 0652   LDLCALC 62 07/24/2017 1307   LDLDIRECT 141.4 04/12/2013 1030    Additional studies/ records that were reviewed today include:  2D echo 12/15/2019Study Conclusions   - Left ventricle: The cavity size was normal. Wall thickness was   normal. Systolic function was normal. The estimated ejection   fraction was in the range of 55% to 60%. Wall motion was normal;   there were no regional wall motion abnormalities. Doppler   parameters are consistent with abnormal left ventricular   relaxation (grade 1 diastolic dysfunction). - Aortic valve: Mildly calcified annulus. There was mild   regurgitation. Valve area (VTI): 1.51 cm^2. Valve area (Vmax):   1.21 cm^2. Valve area (Vmean): 1.37 cm^2. - Mitral valve: Valve area by pressure half-time: 2.39 cm^2.       ASSESSMENT:    1. Essential hypertension   2. Dizziness   3. TIA (transient ischemic attack)   4. Moderate aortic stenosis   5. Chest pain, unspecified type      PLAN:  In order of problems listed above:  Dizziness with fall question secondary to TIA versus bradycardia arrhythmias with documented type I second-degree AV block and 3.3-second pause on Holter monitor.  This did not occur at time of the fall but while she was in the hospital sleeping.  No symptoms since she has been out of the hospital.  Will stop metoprolol and continue to monitor her.  Follow-up with Dr. Tenny Craw in a couple weeks to reassess  Essential hypertension blood pressure looks good today.  Has had elevated blood pressures in the past.  Suspect it will go up with stopping metoprolol.  She does check it every day and will call us  if it goes up before she comes back  in.  Mild to moderate aortic stenosis aortic valve area 1.37 cm on echo 05/31/2018  History of chest pain nuclear stress test 08/01/2017 low risk no ischemia.  No recent chest pain    Medication Adjustments/Labs and Tests Ordered: Current medicines are reviewed at length with the patient today.  Concerns regarding medicines are outlined above.  Medication changes, Labs and Tests ordered today are listed in the Patient Instructions below. Patient Instructions  Medication Instructions:  Your physician has recommended you make the following change in your medication:  1.) stop metoprolol tartrate (Lopressor)  If you need a refill on your cardiac medications before your next appointment, please call your pharmacy.   Lab work: Today: BMET If you have labs (blood work) drawn today and your tests are completely normal, you will receive your results only by: Marland Kitchen MyChart Message (if you have MyChart) OR . A paper copy in the mail If you have any lab test that is abnormal or we need to change your treatment, we will call you to review the results.  Testing/Procedures: none  Follow-Up: Your physician recommends that you schedule a follow-up appointment in: January, 2020.  We will contact you to arrange this appointment with Dr. Tenny Craw.   Any Other Special Instructions Will Be Listed Below (If Applicable).       Elson Clan, PA-C  06/03/2018 2:47 PM    [facility] [address] [place], [place], [place]  [address] Phone: [phone]; Fax: [phone]

## 2018-06-04 LAB — BASIC METABOLIC PANEL
BUN/Creatinine Ratio: 25 (ref 12–28)
BUN: 28 mg/dL — ABNORMAL HIGH (ref 8–27)
CO2: 25 mmol/L (ref 20–29)
Calcium: 10.2 mg/dL (ref 8.7–10.3)
Chloride: 99 mmol/L (ref 96–106)
Creatinine, Ser: 1.14 mg/dL — ABNORMAL HIGH (ref 0.57–1.00)
GFR calc Af Amer: 56 mL/min/{1.73_m2} — ABNORMAL LOW (ref >59)
GFR calc non Af Amer: 49 mL/min/{1.73_m2} — ABNORMAL LOW (ref >59)
Glucose: 94 mg/dL (ref 65–99)
Potassium: 3.9 mmol/L (ref 3.5–5.2)
SODIUM: 139 mmol/L (ref 134–144)

## 2018-06-05 ENCOUNTER — Other Ambulatory Visit: Payer: PPO

## 2018-06-09 ENCOUNTER — Telehealth: Payer: Self-pay | Admitting: Medical

## 2018-06-09 DIAGNOSIS — R001 Bradycardia, unspecified: Secondary | ICD-10-CM | POA: Diagnosis not present

## 2018-06-09 DIAGNOSIS — I350 Nonrheumatic aortic (valve) stenosis: Secondary | ICD-10-CM | POA: Diagnosis not present

## 2018-06-09 DIAGNOSIS — G459 Transient cerebral ischemic attack, unspecified: Secondary | ICD-10-CM | POA: Diagnosis not present

## 2018-06-09 DIAGNOSIS — Z96653 Presence of artificial knee joint, bilateral: Secondary | ICD-10-CM | POA: Diagnosis not present

## 2018-06-09 DIAGNOSIS — Z7982 Long term (current) use of aspirin: Secondary | ICD-10-CM | POA: Diagnosis not present

## 2018-06-09 DIAGNOSIS — M19012 Primary osteoarthritis, left shoulder: Secondary | ICD-10-CM | POA: Diagnosis not present

## 2018-06-09 DIAGNOSIS — Z9181 History of falling: Secondary | ICD-10-CM | POA: Diagnosis not present

## 2018-06-09 DIAGNOSIS — Z8673 Personal history of transient ischemic attack (TIA), and cerebral infarction without residual deficits: Secondary | ICD-10-CM | POA: Diagnosis not present

## 2018-06-09 DIAGNOSIS — M6281 Muscle weakness (generalized): Secondary | ICD-10-CM | POA: Diagnosis not present

## 2018-06-09 DIAGNOSIS — Z09 Encounter for follow-up examination after completed treatment for conditions other than malignant neoplasm: Secondary | ICD-10-CM | POA: Diagnosis not present

## 2018-06-09 DIAGNOSIS — Z947 Corneal transplant status: Secondary | ICD-10-CM | POA: Diagnosis not present

## 2018-06-09 DIAGNOSIS — E785 Hyperlipidemia, unspecified: Secondary | ICD-10-CM | POA: Diagnosis not present

## 2018-06-09 DIAGNOSIS — I119 Hypertensive heart disease without heart failure: Secondary | ICD-10-CM | POA: Diagnosis not present

## 2018-06-09 DIAGNOSIS — F17210 Nicotine dependence, cigarettes, uncomplicated: Secondary | ICD-10-CM | POA: Diagnosis not present

## 2018-06-09 DIAGNOSIS — M1990 Unspecified osteoarthritis, unspecified site: Secondary | ICD-10-CM | POA: Diagnosis not present

## 2018-06-09 NOTE — Telephone Encounter (Signed)
Notified by [employer] heart monitoring company that patient had an episode of advanced HB and 3.2 second pause. Per review of event strips, patient with episodes of 2nd degree AV block type 1. She has been noted to have this degree of AV block last week at which time her chlorthalidone was decreased. Patient reports that she was sleeping at the time and without complaints. Will continue to monitor for the duration of her 30-day event monitor period.   Beatriz StallionKrista M. Shelden Raborn, PA-C 06/09/18

## 2018-06-18 ENCOUNTER — Telehealth: Payer: Self-pay | Admitting: Internal Medicine

## 2018-06-18 NOTE — Telephone Encounter (Signed)
Call came in from [employer] that pt had a 3.5 second pause this morning at 6:28A.  Spoke with pt and she states she was awake but still lying in bed.  Denied any symptoms.  Showed to DOD, Dr. Ladona Ridgelaylor, and he said continue to monitor.

## 2018-06-20 ENCOUNTER — Telehealth: Payer: Self-pay | Admitting: Cardiology

## 2018-06-20 NOTE — Telephone Encounter (Signed)
I received a call from the monitoring company as the on-call provider to report that the patient's monitor showed second-degree AV block type II at 60 bpm, auto triggered.  I called the patient and she is having absolutely no symptoms.  No lightheadedness, chest discomfort, shortness of breath, syncope.  I reviewed symptoms that she should report.  I will route this message to Herma Carson and Dr. Tenny Craw.

## 2018-06-20 NOTE — Telephone Encounter (Signed)
Need to get copy of strips from event monitor to review

## 2018-06-23 ENCOUNTER — Encounter (HOSPITAL_BASED_OUTPATIENT_CLINIC_OR_DEPARTMENT_OTHER): Payer: Self-pay

## 2018-06-23 ENCOUNTER — Ambulatory Visit (HOSPITAL_BASED_OUTPATIENT_CLINIC_OR_DEPARTMENT_OTHER): Admit: 2018-06-23 | Payer: PPO | Admitting: Plastic Surgery

## 2018-06-23 SURGERY — PANNICULECTOMY
Anesthesia: General | Site: Abdomen

## 2018-06-29 NOTE — Telephone Encounter (Signed)
Patient is due for f/u with PR per Leda GauzeM. Lenze, PA-C. She turned monitor in on 06/26/18. Scheduled patient with PR on 07/13/18 at 1:40 pm. Pt appreciative for the call.

## 2018-07-04 ENCOUNTER — Telehealth: Payer: Self-pay | Admitting: Internal Medicine

## 2018-07-04 NOTE — Telephone Encounter (Signed)
Patient had event monitor    She had transient complete HB with 3.5 sec pause  Needs appt with Rosette Reveal this week to discuss findings and pacermaker for heart block

## 2018-07-06 ENCOUNTER — Telehealth: Payer: Self-pay

## 2018-07-06 DIAGNOSIS — I459 Conduction disorder, unspecified: Secondary | ICD-10-CM

## 2018-07-06 DIAGNOSIS — R9431 Abnormal electrocardiogram [ECG] [EKG]: Secondary | ICD-10-CM

## 2018-07-06 NOTE — Telephone Encounter (Signed)
-----   Message from Rosalio Macadamia, NP sent at 07/06/2018  7:33 AM EST ----- Reviewing for Jacolyn Reedy, PA Per prior note - patient was to be referred to Dr. Ladona Ridgel per Dr. Tenny Craw for [facility]. Please arrange.  Lawson Fiscal ----- Message ----- From: Pricilla Riffle, MD Sent: 07/04/2018  10:52 PM EST To: Dyann Kief, PA-C

## 2018-07-06 NOTE — Telephone Encounter (Signed)
Scheduler to call Pt to get in with Dr. Ladona Ridgel this Friday 07/10/2018.

## 2018-07-06 NOTE — Telephone Encounter (Signed)
Put in referral for patient to see Dr. Ladona Ridgel for PPM. Will send message to his scheduler.

## 2018-07-10 ENCOUNTER — Ambulatory Visit (INDEPENDENT_AMBULATORY_CARE_PROVIDER_SITE_OTHER): Payer: Medicare PPO | Admitting: Internal Medicine

## 2018-07-10 ENCOUNTER — Encounter (INDEPENDENT_AMBULATORY_CARE_PROVIDER_SITE_OTHER): Payer: Self-pay

## 2018-07-10 ENCOUNTER — Encounter: Payer: Self-pay | Admitting: Internal Medicine

## 2018-07-10 VITALS — BP 110/70 | HR 68 | Ht 64.0 in | Wt 205.8 lb

## 2018-07-10 DIAGNOSIS — I459 Conduction disorder, unspecified: Secondary | ICD-10-CM | POA: Diagnosis not present

## 2018-07-10 LAB — BASIC METABOLIC PANEL
BUN/Creatinine Ratio: 23 (ref 12–28)
BUN: 19 mg/dL (ref 8–27)
CALCIUM: 10.4 mg/dL — AB (ref 8.7–10.3)
CO2: 25 mmol/L (ref 20–29)
Chloride: 99 mmol/L (ref 96–106)
Creatinine, Ser: 0.84 mg/dL (ref 0.57–1.00)
GFR calc Af Amer: 81 mL/min/{1.73_m2} (ref >59)
GFR calc non Af Amer: 71 mL/min/{1.73_m2} (ref >59)
Glucose: 82 mg/dL (ref 65–99)
Potassium: 3.3 mmol/L — ABNORMAL LOW (ref 3.5–5.2)
Sodium: 141 mmol/L (ref 134–144)

## 2018-07-10 LAB — CBC WITH DIFFERENTIAL/PLATELET
Basophils Absolute: 0 10*3/uL (ref 0.0–0.2)
Basos: 0 %
EOS (ABSOLUTE): 0.2 10*3/uL (ref 0.0–0.4)
Eos: 3 %
Hematocrit: 37.2 % (ref 34.0–46.6)
Hemoglobin: 11.9 g/dL (ref 11.1–15.9)
IMMATURE GRANS (ABS): 0 10*3/uL (ref 0.0–0.1)
IMMATURE GRANULOCYTES: 0 %
LYMPHS: 43 %
Lymphocytes Absolute: 2.9 10*3/uL (ref 0.7–3.1)
MCH: 28.2 pg (ref 26.6–33.0)
MCHC: 32 g/dL (ref 31.5–35.7)
MCV: 88 fL (ref 79–97)
Monocytes Absolute: 0.4 10*3/uL (ref 0.1–0.9)
Monocytes: 6 %
Neutrophils Absolute: 3.2 10*3/uL (ref 1.4–7.0)
Neutrophils: 48 %
Platelets: 304 10*3/uL (ref 150–450)
RBC: 4.22 x10E6/uL (ref 3.77–5.28)
RDW: 13.1 % (ref 11.7–15.4)
WBC: 6.7 10*3/uL (ref 3.4–10.8)

## 2018-07-10 NOTE — Progress Notes (Signed)
HPI [name] is referred by Dr. Tenny Craw for evaluation of CHB. She is a pleasant 73 yo woman with HTN and obesity who has lost weight. She c/o dizzy spells a couple of months ago and wore a heart monitor and had transient and recurrent CHB, lasting 3.5 seconds. She has not had frank syncope. She is not on any AV nodal blocking drugs. She has dyspnea with exertion.  Allergies  Allergen Reactions  . Bee Venom Anaphylaxis    ALL BEE STINGS  . Oxycodone-Acetaminophen Itching     Current Outpatient Medications  Medication Sig Dispense Refill  . amLODipine (NORVASC) 5 MG tablet Take 1 tablet (5 mg total) by mouth daily. 180 tablet 3  . atorvastatin (LIPITOR) 40 MG tablet Take 40 mg by mouth every evening.    . Calcium Citrate (CAL-CITRATE PO) Take 1 tablet by mouth daily.     . chlorthalidone (HYGROTON) 25 MG tablet Take 1 tablet (25 mg total) by mouth daily. 90 tablet 3  . EPINEPHrine (EPIPEN JR) 0.15 MG/0.3ML injection Inject 0.15 mg into the muscle as needed for anaphylaxis.     Marland Kitchen ergocalciferol (VITAMIN D2) 50000 UNITS capsule Take 50,000 Units by mouth every Wednesday.     Marland Kitchen HYDROcodone-acetaminophen (NORCO) 10-325 MG tablet Take 1 tablet by mouth 2 (two) times daily.    . Multiple Vitamins-Minerals (MULTIVITAMIN WITH MINERALS) tablet Take 1 tablet by mouth daily.    . potassium chloride SA (KLOR-CON M20) 20 MEQ tablet Take 1 tablet (20 mEq total) by mouth daily. 30 tablet 0   No current facility-administered medications for this visit.      Past Medical History:  Diagnosis Date  . Arthritis   . Heart murmur    refer to cardiologist note from dr Dietrich Pates  . Hyperlipidemia   . Hypertension   . Left ureteral calculus   . Wears dentures    upper  and partial lower  . Wears glasses     ROS:   All systems reviewed and negative except as noted in the HPI.   Past Surgical History:  Procedure Laterality Date  . CARDIOVASCULAR STRESS TEST  03-31-2012   normal  perfusion study/  no ischemia/  ef 69%  . COLONOSCOPY WITH ESOPHAGOGASTRODUODENOSCOPY (EGD)  09-21-2003  . CORNEAL TRANSPLANT Left 2002  . CYSTOSCOPY WITH URETEROSCOPY AND STENT PLACEMENT Left 01/07/2014   Procedure: CYSTOSCOPY WITH LEFT RETRGRADE, URETEROSCOPY , AND LASER LITHOTRIPSY STONE EXTRACTION AND LEFT STENT PLACEMENT;  Surgeon: Crist Fat, MD;  Location: [facility];  Service: Urology;  Laterality: Left;  . HOLMIUM LASER APPLICATION Left 01/07/2014   Procedure: HOLMIUM LASER APPLICATION;  Surgeon: Crist Fat, MD;  Location: [facility];  Service: Urology;  Laterality: Left;  . REVISION TOTAL KNEE ARTHROPLASTY  left 08-01-2003/  right 02-20-02006  . TOTAL KNEE ARTHROPLASTY  left 03-30-2003/  right 11-05-2004   post op left knee I & D arthroscopic lavage 04-20-2003  . TRANSTHORACIC ECHOCARDIOGRAM  08-28-2011  dr Gunnar Fusi ross   moderate lvh/  ef 60-65%/  grade 2 diastolic dysfunction/ normal lv function / hyperdynamic lv with outflow murmur / turbulance through LVOT , mild SAM/ moderate lae/ moderate tr/  trivial mv & pv     Family History  Problem Relation Age of Onset  . Hypertension Father   . Hypertension Brother      Social History   Socioeconomic History  . Marital status: Legally Separated    Spouse  name: Not on file  . Number of children: Not on file  . Years of education: Not on file  . Highest education level: Not on file  Occupational History  . Not on file  Social Needs  . Financial resource strain: Not on file  . Food insecurity:    Worry: Not on file    Inability: Not on file  . Transportation needs:    Medical: Not on file    Non-medical: Not on file  Tobacco Use  . Smoking status: Current Some Day Smoker    Packs/day: 0.50    Types: Cigarettes  . Smokeless tobacco: Never Used  . Tobacco comment: per pt occasional smoker  Substance and Sexual Activity  . Alcohol use: Yes    Alcohol/week: 3.0 standard drinks     Types: 3 Glasses of wine per week  . Drug use: No  . Sexual activity: Not on file  Lifestyle  . Physical activity:    Days per week: Not on file    Minutes per session: Not on file  . Stress: Not on file  Relationships  . Social connections:    Talks on phone: Not on file    Gets together: Not on file    Attends religious service: Not on file    Active member of club or organization: Not on file    Attends meetings of clubs or organizations: Not on file    Relationship status: Not on file  . Intimate partner violence:    Fear of current or ex partner: Not on file    Emotionally abused: Not on file    Physically abused: Not on file    Forced sexual activity: Not on file  Other Topics Concern  . 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 Occasional smoking and Etoh.     BP 110/70   Pulse 68   Ht 5' 4" (1.626 m)   Wt 205 lb 12.8 oz (93.4 kg)   SpO2 97%   BMI 35.33 kg/m   Physical Exam:  Well appearing 72 yo woman, NAD HEENT: Unremarkable Neck:  6 cm JVD, no thyromegally Lymphatics:  No adenopathy Back:  No CVA tenderness Lungs:  Clear with no wheezes HEART:  Regular rate rhythm, no murmurs, no rubs, no clicks Abd:  soft, positive bowel sounds, no organomegally, no rebound, no guarding Ext:  2 plus pulses, no edema, no cyanosis, no clubbing Skin:  No rashes no nodules Neuro:  CN II through XII intact, motor grossly intact  Assess/Plan: 1. Transient symptomatic AV block - I have discussed the natural history of her condition. I have recommended she undergo insertion of a PPM. I have discussed the indications/risks/benefits/goals/expectations of PPM insertion and she wishes to proceed. 2. AS - her AS is mild by echo. 3. HTN - her blood pressure is controlled.   Gilles Trimpe,M.D.  

## 2018-07-10 NOTE — H&P (View-Only) (Signed)
HPI Kelsey [name] is referred by Dr. Tenny Craw for evaluation of CHB. She is a pleasant 73 yo woman with HTN and obesity who has lost weight. She c/o dizzy spells a couple of months ago and wore a heart monitor and had transient and recurrent CHB, lasting 3.5 seconds. She has not had frank syncope. She is not on any AV nodal blocking drugs. She has dyspnea with exertion.  Allergies  Allergen Reactions  . Bee Venom Anaphylaxis    ALL BEE STINGS  . Oxycodone-Acetaminophen Itching     Current Outpatient Medications  Medication Sig Dispense Refill  . amLODipine (NORVASC) 5 MG tablet Take 1 tablet (5 mg total) by mouth daily. 180 tablet 3  . atorvastatin (LIPITOR) 40 MG tablet Take 40 mg by mouth every evening.    . Calcium Citrate (CAL-CITRATE PO) Take 1 tablet by mouth daily.     . chlorthalidone (HYGROTON) 25 MG tablet Take 1 tablet (25 mg total) by mouth daily. 90 tablet 3  . EPINEPHrine (EPIPEN JR) 0.15 MG/0.3ML injection Inject 0.15 mg into the muscle as needed for anaphylaxis.     Marland Kitchen ergocalciferol (VITAMIN D2) 50000 UNITS capsule Take 50,000 Units by mouth every Wednesday.     Marland Kitchen HYDROcodone-acetaminophen (NORCO) 10-325 MG tablet Take 1 tablet by mouth 2 (two) times daily.    . Multiple Vitamins-Minerals (MULTIVITAMIN WITH MINERALS) tablet Take 1 tablet by mouth daily.    . potassium chloride SA (KLOR-CON M20) 20 MEQ tablet Take 1 tablet (20 mEq total) by mouth daily. 30 tablet 0   No current facility-administered medications for this visit.      Past Medical History:  Diagnosis Date  . Arthritis   . Heart murmur    refer to cardiologist note from dr Dietrich Pates  . Hyperlipidemia   . Hypertension   . Left ureteral calculus   . Wears dentures    upper  and partial lower  . Wears glasses     ROS:   All systems reviewed and negative except as noted in the HPI.   Past Surgical History:  Procedure Laterality Date  . CARDIOVASCULAR STRESS TEST  03-31-2012   normal  perfusion study/  no ischemia/  ef 69%  . COLONOSCOPY WITH ESOPHAGOGASTRODUODENOSCOPY (EGD)  09-21-2003  . CORNEAL TRANSPLANT Left 2002  . CYSTOSCOPY WITH URETEROSCOPY AND STENT PLACEMENT Left 01/07/2014   Procedure: CYSTOSCOPY WITH LEFT RETRGRADE, URETEROSCOPY , AND LASER LITHOTRIPSY STONE EXTRACTION AND LEFT STENT PLACEMENT;  Surgeon: Crist Fat, MD;  Location: [facility];  Service: Urology;  Laterality: Left;  . HOLMIUM LASER APPLICATION Left 01/07/2014   Procedure: HOLMIUM LASER APPLICATION;  Surgeon: Crist Fat, MD;  Location: [facility];  Service: Urology;  Laterality: Left;  . REVISION TOTAL KNEE ARTHROPLASTY  left 08-01-2003/  right 02-20-02006  . TOTAL KNEE ARTHROPLASTY  left 03-30-2003/  right 11-05-2004   post op left knee I & D arthroscopic lavage 04-20-2003  . TRANSTHORACIC ECHOCARDIOGRAM  08-28-2011  dr Gunnar Fusi ross   moderate lvh/  ef 60-65%/  grade 2 diastolic dysfunction/ normal lv function / hyperdynamic lv with outflow murmur / turbulance through LVOT , mild SAM/ moderate lae/ moderate tr/  trivial mv & pv     Family History  Problem Relation Age of Onset  . Hypertension Father   . Hypertension Brother      Social History   Socioeconomic History  . Marital status: Legally Separated    Spouse  name: Not on file  . Number of children: Not on file  . Years of education: Not on file  . Highest education level: Not on file  Occupational History  . Not on file  Social Needs  . Financial resource strain: Not on file  . Food insecurity:    Worry: Not on file    Inability: Not on file  . Transportation needs:    Medical: Not on file    Non-medical: Not on file  Tobacco Use  . Smoking status: Current Some Day Smoker    Packs/day: 0.50    Types: Cigarettes  . Smokeless tobacco: Never Used  . Tobacco comment: per pt occasional smoker  Substance and Sexual Activity  . Alcohol use: Yes    Alcohol/week: 3.0 standard drinks     Types: 3 Glasses of wine per week  . Drug use: No  . Sexual activity: Not on file  Lifestyle  . Physical activity:    Days per week: Not on file    Minutes per session: Not on file  . Stress: Not on file  Relationships  . Social connections:    Talks on phone: Not on file    Gets together: Not on file    Attends religious service: Not on file    Active member of club or organization: Not on file    Attends meetings of clubs or organizations: Not on file    Relationship status: Not on file  . Intimate partner violence:    Fear of current or ex partner: Not on file    Emotionally abused: Not on file    Physically abused: Not on file    Forced sexual activity: Not on file  Other Topics Concern  . 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 Widowed - husband died 2013. Occasional smoking and Etoh.     BP 110/70   Pulse 68   Ht 5\' 4"  (1.626 m)   Wt 205 lb 12.8 oz (93.4 kg)   SpO2 97%   BMI 35.33 kg/m   Physical Exam:  Well appearing 73 yo woman, NAD HEENT: Unremarkable Neck:  6 cm JVD, no thyromegally Lymphatics:  No adenopathy Back:  No CVA tenderness Lungs:  Clear with no wheezes HEART:  Regular rate rhythm, no murmurs, no rubs, no clicks Abd:  soft, positive bowel sounds, no organomegally, no rebound, no guarding Ext:  2 plus pulses, no edema, no cyanosis, no clubbing Skin:  No rashes no nodules Neuro:  CN II through XII intact, motor grossly intact  Assess/Plan: 1. Transient symptomatic AV block - I have discussed the natural history of her condition. I have recommended she undergo insertion of a PPM. I have discussed the indications/risks/benefits/goals/expectations of PPM insertion and she wishes to proceed. 2. AS - her AS is mild by echo. 3. HTN - her blood pressure is controlled.   Leonia ReevesGregg Taylor,M.D.

## 2018-07-10 NOTE — Patient Instructions (Addendum)
Medication Instructions:  Your physician recommends that you continue on your current medications as directed. Please refer to the Current Medication list given to you today.  Labwork: You will get lab work today:  BMP and CBC.  Testing/Procedures: Your physician has recommended that you have a pacemaker inserted. A pacemaker is a small device that is placed under the skin of your chest or abdomen to help control abnormal heart rhythms. This device uses electrical pulses to prompt the heart to beat at a normal rate. Pacemakers are used to treat heart rhythms that are too slow. Wire (leads) are attached to the pacemaker that goes into the chambers of you heart. This is done in the hospital and usually requires and overnight stay. Please see the instruction sheet given to you today for more information.  Follow-Up:  You will follow up with the device clinic 10-14 days after your procedure for a wound check.  You will follow up with Dr. Ladona Ridgelaylor 91 days after your procedure.    PACEMAKER INSTRUCTIONS:  Please arrive to ADMITTING down the hall [facility] of [facility] at:  7:30 am on July 15, 2018  Use the CHG surgical scrub the night before and morning of your procedure.  Follow the instruction sheet.  Do not eat or drink after midnight prior to procedure  On the morning of your procedure take your normal AM medications with a sip of water except for:  CHLORTHALIDONE OR POTASSIUM  Plan for one night stay  You will need someone to drive you home at discharge  If you need a refill on your cardiac medications before your next appointment, please call your pharmacy.    Pacemaker Implantation, Adult Pacemaker implantation is a procedure to place a pacemaker inside your chest. A pacemaker is a small computer that sends electrical signals to the heart and helps your heart beat normally. A pacemaker also stores information about your heart rhythms. You may  need pacemaker implantation if you:  Have a slow heartbeat (bradycardia).  Faint (syncope).  Have shortness of breath (dyspnea) due to heart problems. The pacemaker attaches to your heart through a wire, called a lead. Sometimes just one lead is needed. Other times, there will be two leads. There are two types of pacemakers:  Transvenous pacemaker. This type is placed under the skin or muscle of your chest. The lead goes through a vein in the chest area to reach the inside of the heart.  Epicardial pacemaker. This type is placed under the skin or muscle of your chest or belly. The lead goes through your chest to the outside of the heart. Tell a health care provider about:  Any allergies you have.  All medicines you are taking, including vitamins, herbs, eye drops, creams, and over-the-counter medicines.  Any problems you or family members have had with anesthetic medicines.  Any blood or bone disorders you have.  Any surgeries you have had.  Any medical conditions you have.  Whether you are pregnant or may be pregnant. What are the risks? Generally, this is a safe procedure. However, problems may occur, including:  Infection.  Bleeding.  Failure of the pacemaker or the lead.  Collapse of a lung or bleeding into a lung.  Blood clot inside a blood vessel with a lead.  Damage to the heart.  Infection inside the heart (endocarditis).  Allergic reactions to medicines. What happens before the procedure? Staying hydrated Follow instructions from your health care provider about  hydration, which may include:  Up to 2 hours before the procedure - you may continue to drink clear liquids, such as water, clear fruit juice, black coffee, and plain tea. Eating and drinking restrictions Follow instructions from your health care provider about eating and drinking, which may include:  8 hours before the procedure - stop eating heavy meals or foods such as meat, fried foods, or  fatty foods.  6 hours before the procedure - stop eating light meals or foods, such as toast or cereal.  6 hours before the procedure - stop drinking milk or drinks that contain milk.  2 hours before the procedure - stop drinking clear liquids. Medicines  Ask your health care provider about: ? Changing or stopping your regular medicines. This is especially important if you are taking diabetes medicines or blood thinners. ? Taking medicines such as aspirin and ibuprofen. These medicines can thin your blood. Do not take these medicines before your procedure if your health care provider instructs you not to.  You may be given antibiotic medicine to help prevent infection. General instructions  You will have a heart evaluation. This may include an electrocardiogram (ECG), chest X-ray, and heart imaging (echocardiogram,  or echo) tests.  You will have blood tests.  Do not use any products that contain nicotine or tobacco, such as cigarettes and e-cigarettes. If you need help quitting, ask your health care provider.  Plan to have someone take you home from the hospital or clinic.  If you will be going home right after the procedure, plan to have someone with you for 24 hours.  Ask your health care provider how your surgical site will be marked or identified. What happens during the procedure?  To reduce your risk of infection: ? Your health care team will wash or sanitize their hands. ? Your skin will be washed with soap. ? Hair may be removed from the surgical area.  An IV tube will be inserted into one of your veins.  You will be given one or more of the following: ? A medicine to help you relax (sedative). ? A medicine to numb the area (local anesthetic). ? A medicine to make you fall asleep (general anesthetic).  If you are getting a transvenous pacemaker: ? An incision will be made in your upper chest. ? A pocket will be made for the pacemaker. It may be placed under the  skin or between layers of muscle. ? The lead will be inserted into a blood vessel that returns to the heart. ? While X-rays are taken by an imaging machine (fluoroscopy), the lead will be advanced through the vein to the inside of your heart. ? The other end of the lead will be tunneled under the skin and attached to the pacemaker.  If you are getting an epicardial pacemaker: ? An incision will be made near your ribs or breastbone (sternum) for the lead. ? The lead will be attached to the outside of your heart. ? Another incision will be made in your chest or upper belly to create a pocket for the pacemaker. ? The free end of the lead will be tunneled under the skin and attached to the pacemaker.  The transvenous or epicardial pacemaker will be tested. Imaging studies may be done to check the lead position.  The incisions will be closed with stitches (sutures), adhesive strips, or skin glue.  Bandages (dressing) will be placed over the incisions. The procedure may vary among health care providers and  hospitals. What happens after the procedure?  Your blood pressure, heart rate, breathing rate, and blood oxygen level will be monitored until the medicines you were given have worn off.  You will be given antibiotics and pain medicine.  ECG and chest x-rays will be done.  You will wear a continuous type of ECG (Holter monitor) to check your heart rhythm.  Your health care provider will program the pacemaker.  Do not drive for 24 hours if you received a sedative. This information is not intended to replace advice given to you by your health care provider. Make sure you discuss any questions you have with your health care provider. Document Released: 05/24/2002 Document Revised: 02/20/2018 Document Reviewed: 11/15/2015 Elsevier Interactive Patient Education  2019 [employer].

## 2018-07-13 ENCOUNTER — Ambulatory Visit: Payer: PPO | Admitting: Internal Medicine

## 2018-07-13 ENCOUNTER — Telehealth: Payer: Self-pay | Admitting: Internal Medicine

## 2018-07-13 ENCOUNTER — Telehealth: Payer: Self-pay

## 2018-07-13 MED ORDER — POTASSIUM CHLORIDE CRYS ER 20 MEQ PO TBCR: 20.0000 meq | EXTENDED_RELEASE_TABLET | Freq: Two times a day (BID) | ORAL | 3 refills | Status: DC

## 2018-07-13 NOTE — Telephone Encounter (Signed)
-----   Message from Marinus Maw, MD sent at 07/12/2018  7:10 PM EST ----- Take potassium twice daily.

## 2018-07-13 NOTE — Telephone Encounter (Signed)
Call placed to Pt.  Advised to increase potassium 20 meq PO to BID  Pt indicates understanding.  Sent to pharmacy as requested.

## 2018-07-13 NOTE — Telephone Encounter (Signed)
Returned call to Pt.  Pt was contacted by hospital to go over her medications and now she is confused.  Advised Pt not to worry about the hospital call.    Her medications will be reviewed by the nurse in short stay on arrival.  Pt feels better-thanked nurse for return call.

## 2018-07-13 NOTE — Telephone Encounter (Signed)
New Message    Patient returning call to Nurse who was speaking to her about her Cholesterol results.

## 2018-07-15 ENCOUNTER — Ambulatory Visit (HOSPITAL_COMMUNITY)
Admission: RE | Admit: 2018-07-15 | Discharge: 2018-07-16 | Disposition: A | Discharge status: Home / Self Care | Payer: Medicare PPO | Attending: Internal Medicine | Admitting: Internal Medicine

## 2018-07-15 ENCOUNTER — Other Ambulatory Visit: Payer: Self-pay

## 2018-07-15 ENCOUNTER — Encounter (HOSPITAL_COMMUNITY)
Admission: RE | Disposition: A | Discharge status: Home / Self Care | Payer: Self-pay | Source: Home / Self Care | Attending: Internal Medicine

## 2018-07-15 ENCOUNTER — Encounter (HOSPITAL_COMMUNITY): Payer: Self-pay | Admitting: *Deleted

## 2018-07-15 DIAGNOSIS — Z79899 Other long term (current) drug therapy: Secondary | ICD-10-CM | POA: Diagnosis not present

## 2018-07-15 DIAGNOSIS — R011 Cardiac murmur, unspecified: Secondary | ICD-10-CM | POA: Diagnosis not present

## 2018-07-15 DIAGNOSIS — Z950 Presence of cardiac pacemaker: Secondary | ICD-10-CM

## 2018-07-15 DIAGNOSIS — M1990 Unspecified osteoarthritis, unspecified site: Secondary | ICD-10-CM | POA: Insufficient documentation

## 2018-07-15 DIAGNOSIS — Z6835 Body mass index (BMI) 35.0-35.9, adult: Secondary | ICD-10-CM | POA: Insufficient documentation

## 2018-07-15 DIAGNOSIS — E785 Hyperlipidemia, unspecified: Secondary | ICD-10-CM | POA: Insufficient documentation

## 2018-07-15 DIAGNOSIS — Z8249 Family history of ischemic heart disease and other diseases of the circulatory system: Secondary | ICD-10-CM | POA: Diagnosis not present

## 2018-07-15 DIAGNOSIS — F17210 Nicotine dependence, cigarettes, uncomplicated: Secondary | ICD-10-CM | POA: Diagnosis not present

## 2018-07-15 DIAGNOSIS — E669 Obesity, unspecified: Secondary | ICD-10-CM | POA: Diagnosis not present

## 2018-07-15 DIAGNOSIS — Z885 Allergy status to narcotic agent status: Secondary | ICD-10-CM | POA: Diagnosis not present

## 2018-07-15 DIAGNOSIS — I442 Atrioventricular block, complete: Secondary | ICD-10-CM | POA: Diagnosis present

## 2018-07-15 DIAGNOSIS — I10 Essential (primary) hypertension: Secondary | ICD-10-CM | POA: Diagnosis not present

## 2018-07-15 HISTORY — PX: PACEMAKER IMPLANT: EP1218

## 2018-07-15 HISTORY — DX: Presence of cardiac pacemaker: Z95.0

## 2018-07-15 LAB — POTASSIUM: Potassium: 4 mmol/L (ref 3.5–5.1)

## 2018-07-15 LAB — SURGICAL PCR SCREEN
MRSA, PCR: NEGATIVE
Staphylococcus aureus: NEGATIVE

## 2018-07-15 SURGERY — PACEMAKER IMPLANT

## 2018-07-15 MED ORDER — SODIUM CHLORIDE 0.9 % IV SOLN
INTRAVENOUS | Status: DC
  Administered 2018-07-15: 10:00:00 via INTRAVENOUS

## 2018-07-15 MED ORDER — SODIUM CHLORIDE 0.9 % IV SOLN
80.0000 mg | INTRAVENOUS | Status: AC
  Administered 2018-07-15: 80 mg
  Filled 2018-07-15: qty 2

## 2018-07-15 MED ORDER — ADULT MULTIVITAMIN W/MINERALS CH
1.0000 | ORAL_TABLET | Freq: Every day | ORAL | Status: DC
  Administered 2018-07-16: 1 via ORAL
  Filled 2018-07-15: qty 1

## 2018-07-15 MED ORDER — MIDAZOLAM HCL 5 MG/5ML IJ SOLN
INTRAMUSCULAR | Status: AC
  Filled 2018-07-15: qty 5

## 2018-07-15 MED ORDER — ASPIRIN EC 325 MG PO TBEC
325.0000 mg | DELAYED_RELEASE_TABLET | Freq: Every day | ORAL | Status: DC
  Administered 2018-07-16: 325 mg via ORAL
  Filled 2018-07-15: qty 1

## 2018-07-15 MED ORDER — HEPARIN (PORCINE) IN NACL 1000-0.9 UT/500ML-% IV SOLN
INTRAVENOUS | Status: AC
  Filled 2018-07-15: qty 500

## 2018-07-15 MED ORDER — CEFAZOLIN SODIUM-DEXTROSE 1-4 GM/50ML-% IV SOLN
1.0000 g | Freq: Four times a day (QID) | INTRAVENOUS | Status: AC
  Administered 2018-07-15 – 2018-07-16 (×3): 1 g via INTRAVENOUS
  Filled 2018-07-15 (×4): qty 50

## 2018-07-15 MED ORDER — FENTANYL CITRATE (PF) 100 MCG/2ML IJ SOLN
INTRAMUSCULAR | Status: DC | PRN
  Administered 2018-07-15: 12.5 ug via INTRAVENOUS
  Administered 2018-07-15: 25 ug via INTRAVENOUS
  Administered 2018-07-15: 12.5 ug via INTRAVENOUS

## 2018-07-15 MED ORDER — LIDOCAINE HCL 1 % IJ SOLN
INTRAMUSCULAR | Status: AC
  Filled 2018-07-15: qty 60

## 2018-07-15 MED ORDER — VITAMIN D (ERGOCALCIFEROL) 1.25 MG (50000 UNIT) PO CAPS: 50000.0000 [IU] | ORAL_CAPSULE | ORAL | Status: DC

## 2018-07-15 MED ORDER — SODIUM CHLORIDE 0.9 % IV SOLN
INTRAVENOUS | Status: AC
  Filled 2018-07-15: qty 2

## 2018-07-15 MED ORDER — LIDOCAINE HCL (PF) 1 % IJ SOLN
INTRAMUSCULAR | Status: DC | PRN
  Administered 2018-07-15: 60 mL

## 2018-07-15 MED ORDER — NAPROXEN SODIUM 220 MG PO TABS: 220.0000 mg | ORAL_TABLET | Freq: Every day | ORAL | Status: DC | PRN

## 2018-07-15 MED ORDER — FENTANYL CITRATE (PF) 100 MCG/2ML IJ SOLN
INTRAMUSCULAR | Status: AC
  Filled 2018-07-15: qty 2

## 2018-07-15 MED ORDER — CEFAZOLIN SODIUM-DEXTROSE 2-4 GM/100ML-% IV SOLN
INTRAVENOUS | Status: AC
  Filled 2018-07-15: qty 100

## 2018-07-15 MED ORDER — MUPIROCIN 2 % EX OINT
TOPICAL_OINTMENT | CUTANEOUS | Status: AC
  Administered 2018-07-15: 1
  Filled 2018-07-15: qty 22

## 2018-07-15 MED ORDER — POTASSIUM CHLORIDE CRYS ER 20 MEQ PO TBCR
20.0000 meq | EXTENDED_RELEASE_TABLET | Freq: Two times a day (BID) | ORAL | Status: DC
  Administered 2018-07-15 – 2018-07-16 (×2): 20 meq via ORAL
  Filled 2018-07-15 (×2): qty 1

## 2018-07-15 MED ORDER — ONDANSETRON HCL 4 MG/2ML IJ SOLN: 4.0000 mg | Freq: Four times a day (QID) | INTRAMUSCULAR | Status: DC | PRN

## 2018-07-15 MED ORDER — AMLODIPINE BESYLATE 5 MG PO TABS
5.0000 mg | ORAL_TABLET | Freq: Every day | ORAL | Status: DC
  Administered 2018-07-16: 5 mg via ORAL
  Filled 2018-07-15: qty 1

## 2018-07-15 MED ORDER — ACETAMINOPHEN 325 MG PO TABS
325.0000 mg | ORAL_TABLET | ORAL | Status: DC | PRN
  Administered 2018-07-15 – 2018-07-16 (×2): 650 mg via ORAL
  Filled 2018-07-15 (×2): qty 2

## 2018-07-15 MED ORDER — HEPARIN (PORCINE) IN NACL 1000-0.9 UT/500ML-% IV SOLN
INTRAVENOUS | Status: DC | PRN
  Administered 2018-07-15: 500 mL

## 2018-07-15 MED ORDER — CEFAZOLIN SODIUM-DEXTROSE 2-4 GM/100ML-% IV SOLN
2.0000 g | INTRAVENOUS | Status: AC
  Administered 2018-07-15: 2 g via INTRAVENOUS
  Filled 2018-07-15: qty 100

## 2018-07-15 MED ORDER — TIMOLOL HEMIHYDRATE 0.5 % OP SOLN: 1.0000 [drp] | Freq: Every day | OPHTHALMIC | Status: DC

## 2018-07-15 MED ORDER — CHLORTHALIDONE 25 MG PO TABS
25.0000 mg | ORAL_TABLET | Freq: Every day | ORAL | Status: DC
  Administered 2018-07-16: 25 mg via ORAL
  Filled 2018-07-15: qty 1

## 2018-07-15 MED ORDER — MIDAZOLAM HCL 5 MG/5ML IJ SOLN
INTRAMUSCULAR | Status: DC | PRN
  Administered 2018-07-15 (×4): 1 mg via INTRAVENOUS

## 2018-07-15 MED ORDER — CHLORHEXIDINE GLUCONATE 4 % EX LIQD
60.0000 mL | Freq: Once | CUTANEOUS | Status: DC
  Filled 2018-07-15: qty 60

## 2018-07-15 MED ORDER — ATORVASTATIN CALCIUM 40 MG PO TABS: 40.0000 mg | ORAL_TABLET | Freq: Every evening | ORAL | Status: DC

## 2018-07-15 SURGICAL SUPPLY — 12 items
CABLE SURGICAL S-101-97-12 (CABLE) ×3 IMPLANT
CATH RIGHTSITE C315HIS02 (CATHETERS) ×2 IMPLANT
IPG PACE AZUR XT DR MRI W1DR01 (Pacemaker) IMPLANT
LEAD CAPSURE NOVUS 5076-52CM (Lead) ×2 IMPLANT
LEAD SELECT SECURE 3830 383069 (Lead) IMPLANT
PACE AZURE XT DR MRI W1DR01 (Pacemaker) ×3 IMPLANT
PAD PRO RADIOLUCENT 2001M-C (PAD) ×3 IMPLANT
SELECT SECURE 3830 383069 (Lead) ×3 IMPLANT
SHEATH CLASSIC 7F (SHEATH) ×4 IMPLANT
SLITTER 6232ADJ (MISCELLANEOUS) ×2 IMPLANT
TRAY PACEMAKER INSERTION (PACKS) ×3 IMPLANT
WIRE HI TORQ VERSACORE-J 145CM (WIRE) ×2 IMPLANT

## 2018-07-15 NOTE — Discharge Instructions (Signed)
° ° °  Supplemental Discharge Instructions for  °Pacemaker/Defibrillator Patients ° °Activity °No heavy lifting or vigorous activity with your left/right arm for 6 to 8 weeks.  Do not raise your left/right arm above your head for one week.  Gradually raise your affected arm as drawn below. ° °        °    07/19/2018                  07/20/2018                  07/21/2018                 07/22/2018 °__ ° °NO DRIVING for  1 week   ; you may begin driving on  07/22/2018   . ° °WOUND CARE °- Keep the wound area clean and dry.  Do not get this area wet for one week. No showers for one week; you may shower on  07/22/2018   . °- The tape/steri-strips on your wound will fall off; do not pull them off.  No bandage is needed on the site.  DO  NOT apply any creams, oils, or ointments to the wound area. °- If you notice any drainage or discharge from the wound, any swelling or bruising at the site, or you develop a fever > 101? F after you are discharged home, call the office at once. ° °Special Instructions °- You are still able to use cellular telephones; use the ear opposite the side where you have your pacemaker/defibrillator.  Avoid carrying your cellular phone near your device. °- When traveling through airports, show security personnel your identification card to avoid being screened in the metal detectors.  Ask the security personnel to use the hand wand. °- Avoid arc welding equipment, MRI testing (magnetic resonance imaging), TENS units (transcutaneous nerve stimulators).  Call the office for questions about other devices. °- Avoid electrical appliances that are in poor condition or are not properly grounded. °- Microwave ovens are safe to be near or to operate. ° ° °

## 2018-07-15 NOTE — Discharge Summary (Addendum)
ELECTROPHYSIOLOGY PROCEDURE DISCHARGE SUMMARY    Patient ID: Kelsey [name],  MRN: [id number], DOB/AGE: Nov 30, 1945 73 y.o.  Admit date: 07/15/2018 Discharge date: 07/16/2018  Primary Care Physician: Caffie Damme, MD  Primary Cardiologist: Dr. Tenny Craw Electrophysiologist: Dr. Ladona Ridgel  Primary Discharge Diagnosis:  1. CHB  Secondary Discharge Diagnosis:  1. 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 Obesity  Allergies  Allergen Reactions  . Bee Venom Anaphylaxis    ALL BEE STINGS  . Oxycodone-Acetaminophen Itching     Procedures This Admission:  1.  Implantation of a MDT dual chamber PPM on 07/15/2018 by Dr Ladona Ridgel.  The patient received a Medtronic [id number] (serial number [id number]) right atrial lead and a Medtronic [id number] (serial number [id number]) right ventricular lead, medtronic (serial number [id number]) pacemaker There were no immediate post procedure complications. 2.  CXR on 07/16/2018 demonstrated no pneumothorax status post device implantation.   Brief HPI: Kelsey [name] is a 73 y.o. female was referred to electrophysiology in the outpatient setting for consideration of PPM implantation.  Past medical history includes dizzy spells with transient CHB, and as above.  Risks, benefits, and alternatives to PPM implantation were reviewed with the patient who wished to proceed.   Hospital Course:  The patient was admitted and underwent implantation of a PPM with details as outlined above.  Shee was monitored on telemetry overnight which demonstrated SR, occasional pacing.  Left chest was without hematoma or ecchymosis.  The device was interrogated and found to be functioning normally.  CXR was obtained and demonstrated no pneumothorax status post device implantation.  Wound care, arm mobility, and restrictions were reviewed with the patient.  The patient feels well this morning, denies any CP or SOB, minimal site discomfort, she was examined by Dr. Ladona Ridgel and considered stable  for discharge to home.    Physical Exam: Vitals:   07/15/18 1349 07/15/18 2102 07/16/18 0628 07/16/18 0800  BP: 140/61 (!) 94/50 127/83   Pulse:  76 71 78  Resp: 14 16  19   Temp: 97.7 F (36.5 C) 97.7 F (36.5 C) 98.5 F (36.9 C)   TempSrc: Oral Oral Oral   SpO2: 100% 100% 100% 98%  Weight: 93.2 kg     Height: 5\' 4"  (1.626 m)       GEN- The patient is well appearing, alert and oriented x 3 today.   HEENT: normocephalic, atraumatic; sclera clear, conjunctiva pink; hearing intact; oropharynx clear; neck supple, no JVP Lungs- CTA b/l, normal work of breathing.  No wheezes, rales, rhonchi Heart- RRR, no murmurs, rubs or gallops, PMI not laterally displaced GI- soft, non-tender, non-distended Extremities- no clubbing, cyanosis, or edema MS- no significant deformity or atrophy Skin- warm and dry, no rash or lesion, left chest without hematoma/ecchymosis Psych- euthymic mood, full affect Neuro- no gross deficits   Labs:   Lab Results  Component Value Date   WBC 6.7 07/10/2018   HGB 11.9 07/10/2018   HCT 37.2 07/10/2018   MCV 88 07/10/2018   PLT 304 07/10/2018    Recent Labs  Lab 07/10/18 1054 07/15/18 0927  NA 141  --   K 3.3* 4.0  CL 99  --   CO2 25  --   BUN 19  --   CREATININE 0.84  --   CALCIUM 10.4*  --   GLUCOSE 82  --     Discharge Medications:  Allergies as of 07/16/2018      Reactions  Bee Venom Anaphylaxis   ALL BEE STINGS   Oxycodone-acetaminophen Itching      Medication List    TAKE these medications   amLODipine 5 MG tablet Commonly known as:  NORVASC Take 1 tablet (5 mg total) by mouth daily.   aspirin EC 325 MG tablet Take 325 mg by mouth daily.   atorvastatin 40 MG tablet Commonly known as:  LIPITOR Take 40 mg by mouth every evening.   CALCIUM + D3 PO Take 1 capsule by mouth daily.   chlorthalidone 25 MG tablet Commonly known as:  HYGROTON Take 1 tablet (25 mg total) by mouth daily.   EPIPEN 2-PAK 0.3 mg/0.3 mL Soaj  injection Generic drug:  EPINEPHrine Inject 0.3 mg into the muscle as needed for anaphylaxis.   ergocalciferol 1.25 MG (50000 UT) capsule Commonly known as:  VITAMIN D2 Take 50,000 Units by mouth every Wednesday.   HYDROcodone-acetaminophen 10-325 MG tablet Commonly known as:  NORCO Take 1 tablet by mouth 2 (two) times daily as needed for severe pain.   multivitamin with minerals tablet Take 1 tablet by mouth daily.   naproxen sodium 220 MG tablet Commonly known as:  ALEVE Take 220 mg by mouth daily as needed (pain).   potassium chloride SA 20 MEQ tablet Commonly known as:  KLOR-CON M20 Take 1 tablet (20 mEq total) by mouth 2 (two) times daily.   timolol 0.5 % ophthalmic solution Commonly known as:  BETIMOL Place 1 drop into the left eye daily.       Disposition: Home   Follow-up Information    [facility] Office Follow up.   Specialty:  Cardiology Why:  07/27/2018 @ 12:00PM (noon), wound check visit Contact information: [address] [place] [address] [phone]       Marinus Maw, MD Follow up.   Specialty:  Cardiology Why:  10/15/2018 @ 2:30PM Contact information: [address] [place] [address] [phone]           Duration of Discharge Encounter: Greater than 30 minutes including physician time.  Norma Fredrickson, PA-C 07/16/2018 11:07 AM  EP Attending  Patient seen and examined. Agree with the findings as noted above. Interrogation of her PPM under my direct supervision demonstrates normal DDD PM function. Her cxr demonstrates normal lead position. Her incision looks good. She will be discharged home with usual followup.  Leonia Reeves.D.

## 2018-07-15 NOTE — Interval H&P Note (Signed)
History and Physical Interval Note:  07/15/2018 10:09 AM  Kelsey [name]  has presented today for surgery, with the diagnosis of hb  The various methods of treatment have been discussed with the patient and family. After consideration of risks, benefits and other options for treatment, the patient has consented to  Procedure(s): PACEMAKER IMPLANT (N/A) as a surgical intervention .  The patient's history has been reviewed, patient examined, no change in status, stable for surgery.  I have reviewed the patient's chart and labs.  Questions were answered to the patient's satisfaction.     Kelsey [name]

## 2018-07-15 NOTE — Plan of Care (Signed)
  Problem: Education: Goal: Knowledge of General Education information will improve Description: Including pain rating scale, medication(s)/side effects and non-pharmacologic comfort measures Outcome: Progressing   Problem: Cardiac: Goal: Ability to achieve and maintain adequate cardiopulmonary perfusion will improve Outcome: Progressing   

## 2018-07-16 ENCOUNTER — Encounter (HOSPITAL_COMMUNITY): Payer: Self-pay | Admitting: Internal Medicine

## 2018-07-16 ENCOUNTER — Ambulatory Visit (HOSPITAL_COMMUNITY): Payer: Medicare PPO

## 2018-07-16 DIAGNOSIS — I442 Atrioventricular block, complete: Secondary | ICD-10-CM | POA: Diagnosis not present

## 2018-07-16 DIAGNOSIS — E669 Obesity, unspecified: Secondary | ICD-10-CM | POA: Diagnosis not present

## 2018-07-16 DIAGNOSIS — E785 Hyperlipidemia, unspecified: Secondary | ICD-10-CM | POA: Diagnosis not present

## 2018-07-16 DIAGNOSIS — I10 Essential (primary) hypertension: Secondary | ICD-10-CM | POA: Diagnosis not present

## 2018-07-16 MED FILL — Lidocaine HCl Local Inj 1%: INTRAMUSCULAR | Qty: 40 | Status: AC

## 2018-07-27 ENCOUNTER — Ambulatory Visit (INDEPENDENT_AMBULATORY_CARE_PROVIDER_SITE_OTHER): Payer: Medicare PPO | Admitting: Nurse Practitioner

## 2018-07-27 DIAGNOSIS — I459 Conduction disorder, unspecified: Secondary | ICD-10-CM | POA: Diagnosis not present

## 2018-07-27 LAB — CUP PACEART INCLINIC DEVICE CHECK
Date Time Interrogation Session: 20200210122008
Implantable Lead Implant Date: 20200129
Implantable Lead Implant Date: 20200129
Implantable Lead Location: 753859
Implantable Lead Model: 5076
Implantable Pulse Generator Implant Date: 20200129
MDC IDC LEAD LOCATION: 753860

## 2018-07-27 NOTE — Progress Notes (Signed)

## 2018-10-01 DIAGNOSIS — I350 Nonrheumatic aortic (valve) stenosis: Secondary | ICD-10-CM | POA: Diagnosis not present

## 2018-10-01 DIAGNOSIS — Z85828 Personal history of other malignant neoplasm of skin: Secondary | ICD-10-CM | POA: Diagnosis not present

## 2018-10-01 DIAGNOSIS — D692 Other nonthrombocytopenic purpura: Secondary | ICD-10-CM | POA: Diagnosis not present

## 2018-10-01 DIAGNOSIS — L821 Other seborrheic keratosis: Secondary | ICD-10-CM | POA: Diagnosis not present

## 2018-10-01 DIAGNOSIS — E782 Mixed hyperlipidemia: Secondary | ICD-10-CM | POA: Diagnosis not present

## 2018-10-01 DIAGNOSIS — M25561 Pain in right knee: Secondary | ICD-10-CM | POA: Diagnosis not present

## 2018-10-01 DIAGNOSIS — K420 Umbilical hernia with obstruction, without gangrene: Secondary | ICD-10-CM | POA: Diagnosis not present

## 2018-10-01 DIAGNOSIS — Z720 Tobacco use: Secondary | ICD-10-CM | POA: Diagnosis not present

## 2018-10-01 DIAGNOSIS — R5383 Other fatigue: Secondary | ICD-10-CM | POA: Diagnosis not present

## 2018-10-01 DIAGNOSIS — I10 Essential (primary) hypertension: Secondary | ICD-10-CM | POA: Diagnosis not present

## 2018-10-01 DIAGNOSIS — G459 Transient cerebral ischemic attack, unspecified: Secondary | ICD-10-CM | POA: Diagnosis not present

## 2018-10-01 DIAGNOSIS — F331 Major depressive disorder, recurrent, moderate: Secondary | ICD-10-CM | POA: Diagnosis not present

## 2018-10-01 DIAGNOSIS — E119 Type 2 diabetes mellitus without complications: Secondary | ICD-10-CM | POA: Diagnosis not present

## 2018-10-01 DIAGNOSIS — R7303 Prediabetes: Secondary | ICD-10-CM | POA: Diagnosis not present

## 2018-10-01 DIAGNOSIS — Z8546 Personal history of malignant neoplasm of prostate: Secondary | ICD-10-CM | POA: Diagnosis not present

## 2018-10-01 DIAGNOSIS — Z1231 Encounter for screening mammogram for malignant neoplasm of breast: Secondary | ICD-10-CM | POA: Diagnosis not present

## 2018-10-01 DIAGNOSIS — M1711 Unilateral primary osteoarthritis, right knee: Secondary | ICD-10-CM | POA: Diagnosis not present

## 2018-10-01 DIAGNOSIS — I272 Pulmonary hypertension, unspecified: Secondary | ICD-10-CM | POA: Diagnosis not present

## 2018-10-01 DIAGNOSIS — K439 Ventral hernia without obstruction or gangrene: Secondary | ICD-10-CM | POA: Diagnosis not present

## 2018-10-01 DIAGNOSIS — Z87891 Personal history of nicotine dependence: Secondary | ICD-10-CM | POA: Diagnosis not present

## 2018-10-01 DIAGNOSIS — H0012 Chalazion right lower eyelid: Secondary | ICD-10-CM | POA: Diagnosis not present

## 2018-10-01 DIAGNOSIS — H268 Other specified cataract: Secondary | ICD-10-CM | POA: Diagnosis not present

## 2018-10-01 DIAGNOSIS — E559 Vitamin D deficiency, unspecified: Secondary | ICD-10-CM | POA: Diagnosis not present

## 2018-10-01 DIAGNOSIS — Z6830 Body mass index (BMI) 30.0-30.9, adult: Secondary | ICD-10-CM | POA: Diagnosis not present

## 2018-10-01 DIAGNOSIS — D539 Nutritional anemia, unspecified: Secondary | ICD-10-CM | POA: Diagnosis not present

## 2018-10-01 DIAGNOSIS — M25661 Stiffness of right knee, not elsewhere classified: Secondary | ICD-10-CM | POA: Diagnosis not present

## 2018-10-01 DIAGNOSIS — F419 Anxiety disorder, unspecified: Secondary | ICD-10-CM | POA: Diagnosis not present

## 2018-10-01 DIAGNOSIS — R2689 Other abnormalities of gait and mobility: Secondary | ICD-10-CM | POA: Diagnosis not present

## 2018-10-01 DIAGNOSIS — Z79899 Other long term (current) drug therapy: Secondary | ICD-10-CM | POA: Diagnosis not present

## 2018-10-06 ENCOUNTER — Telehealth: Payer: Self-pay

## 2018-10-06 NOTE — Telephone Encounter (Signed)
Call placed to Pt.  Advised needed to reschedule f/u to June 4.  Pt was able to change.  Pt rescheduled.

## 2018-10-15 ENCOUNTER — Ambulatory Visit (INDEPENDENT_AMBULATORY_CARE_PROVIDER_SITE_OTHER): Payer: PPO | Admitting: *Deleted

## 2018-10-15 ENCOUNTER — Encounter: Payer: Medicare PPO | Admitting: Internal Medicine

## 2018-10-15 ENCOUNTER — Other Ambulatory Visit: Payer: Self-pay

## 2018-10-15 DIAGNOSIS — G459 Transient cerebral ischemic attack, unspecified: Secondary | ICD-10-CM

## 2018-10-15 DIAGNOSIS — I459 Conduction disorder, unspecified: Secondary | ICD-10-CM

## 2018-10-15 LAB — CUP PACEART REMOTE DEVICE CHECK
Battery Remaining Longevity: 179 mo
Battery Voltage: 3.21 V
Brady Statistic AP VP Percent: 0.07 %
Brady Statistic AP VS Percent: 4.57 %
Brady Statistic AS VP Percent: 0.19 %
Brady Statistic AS VS Percent: 95.17 %
Brady Statistic RA Percent Paced: 4.74 %
Brady Statistic RV Percent Paced: 0.26 %
Date Time Interrogation Session: 20200430005653
Implantable Lead Implant Date: 20200129
Implantable Lead Implant Date: 20200129
Implantable Lead Location: 753859
Implantable Lead Location: 753860
Implantable Lead Model: 3830
Implantable Lead Model: 5076
Implantable Pulse Generator Implant Date: 20200129
Lead Channel Impedance Value: 323 Ohm
Lead Channel Impedance Value: 342 Ohm
Lead Channel Impedance Value: 399 Ohm
Lead Channel Impedance Value: 513 Ohm
Lead Channel Pacing Threshold Amplitude: 0.625 V
Lead Channel Pacing Threshold Amplitude: 0.75 V
Lead Channel Pacing Threshold Pulse Width: 0.4 ms
Lead Channel Pacing Threshold Pulse Width: 0.4 ms
Lead Channel Sensing Intrinsic Amplitude: 13.875 mV
Lead Channel Sensing Intrinsic Amplitude: 2 mV
Lead Channel Setting Pacing Amplitude: 3.25 V
Lead Channel Setting Pacing Amplitude: 3.25 V
Lead Channel Setting Pacing Pulse Width: 0.4 ms
Lead Channel Setting Sensing Sensitivity: 1.2 mV

## 2018-10-23 NOTE — Progress Notes (Signed)
Remote pacemaker transmission.   

## 2018-11-02 DIAGNOSIS — M47816 Spondylosis without myelopathy or radiculopathy, lumbar region: Secondary | ICD-10-CM | POA: Diagnosis not present

## 2018-11-02 DIAGNOSIS — M1990 Unspecified osteoarthritis, unspecified site: Secondary | ICD-10-CM | POA: Diagnosis not present

## 2018-11-02 DIAGNOSIS — L259 Unspecified contact dermatitis, unspecified cause: Secondary | ICD-10-CM | POA: Diagnosis not present

## 2018-11-02 DIAGNOSIS — Z79899 Other long term (current) drug therapy: Secondary | ICD-10-CM | POA: Diagnosis not present

## 2018-11-02 DIAGNOSIS — Z950 Presence of cardiac pacemaker: Secondary | ICD-10-CM | POA: Diagnosis not present

## 2018-11-17 ENCOUNTER — Telehealth: Payer: Self-pay

## 2018-11-17 NOTE — Telephone Encounter (Signed)
Spoke with pt regarding covid-19 screening. Pt stated she has not been in contact with anyone who may have covid-19 and have no symtoms.

## 2018-11-19 ENCOUNTER — Other Ambulatory Visit: Payer: Self-pay

## 2018-11-19 ENCOUNTER — Encounter: Payer: Self-pay | Admitting: Internal Medicine

## 2018-11-19 ENCOUNTER — Ambulatory Visit: Payer: PPO | Admitting: Internal Medicine

## 2018-11-19 VITALS — BP 116/68 | HR 83 | Ht 64.0 in | Wt 220.0 lb

## 2018-11-19 DIAGNOSIS — Z950 Presence of cardiac pacemaker: Secondary | ICD-10-CM | POA: Diagnosis not present

## 2018-11-19 DIAGNOSIS — I459 Conduction disorder, unspecified: Secondary | ICD-10-CM | POA: Diagnosis not present

## 2018-11-19 NOTE — Patient Instructions (Signed)
Medication Instructions:  Your physician recommends that you continue on your current medications as directed. Please refer to the Current Medication list given to you today.  Labwork: None ordered.  Testing/Procedures: None ordered.  Follow-Up: Your physician wants you to follow-up in: 9 months with Dr. Ladona Ridgel.  You will receive a reminder letter in the mail two months in advance. If you don't receive a letter, please call our office to schedule the follow-up appointment.  Remote monitoring is used to monitor your Pacemaker from home. This monitoring reduces the number of office visits required to check your device to one time per year. It allows [place] to keep an eye on the functioning of your device to ensure it is working properly. You are scheduled for a device check from home on 01/14/2019. You may send your transmission at any time that day. If you have a wireless device, the transmission will be sent automatically. After your physician reviews your transmission, you will receive a postcard with your next transmission date.  Any Other Special Instructions Will Be Listed Below (If Applicable).  If you need a refill on your cardiac medications before your next appointment, please call your pharmacy.

## 2018-11-19 NOTE — Progress Notes (Signed)
HPI Kelsey [name] returns today for followup after undergoing PPM insertion 4 months ago. She has a h/o transient CHB. She also has dizzy spells. She underwent insertion of a medtronic DDD PM in January. In the interim, she has done well. She denies chest pain, sob palpitations or syncope.  Allergies  Allergen Reactions  . Bee Venom Anaphylaxis    ALL BEE STINGS  . Oxycodone-Acetaminophen Itching     Current Outpatient Medications  Medication Sig Dispense Refill  . aspirin EC 325 MG tablet Take 325 mg by mouth daily.    Marland Kitchen atorvastatin (LIPITOR) 40 MG tablet Take 40 mg by mouth every evening.    . Calcium Carb-Cholecalciferol (CALCIUM + D3 PO) Take 1 capsule by mouth daily.    . chlorthalidone (HYGROTON) 25 MG tablet Take 1 tablet (25 mg total) by mouth daily. 90 tablet 3  . EPINEPHrine (EPIPEN 2-PAK) 0.3 mg/0.3 mL IJ SOAJ injection Inject 0.3 mg into the muscle as needed for anaphylaxis.    Marland Kitchen ergocalciferol (VITAMIN D2) 50000 UNITS capsule Take 50,000 Units by mouth every Wednesday.     Marland Kitchen HYDROcodone-acetaminophen (NORCO) 10-325 MG tablet Take 1 tablet by mouth 2 (two) times daily as needed for severe pain.     . Multiple Vitamins-Minerals (MULTIVITAMIN WITH MINERALS) tablet Take 1 tablet by mouth daily.    . naproxen sodium (ALEVE) 220 MG tablet Take 220 mg by mouth daily as needed (pain).    Marland Kitchen timolol (BETIMOL) 0.5 % ophthalmic solution Place 1 drop into the left eye daily.    Marland Kitchen amLODipine (NORVASC) 5 MG tablet Take 1 tablet (5 mg total) by mouth daily. 180 tablet 3  . potassium chloride SA (KLOR-CON M20) 20 MEQ tablet Take 1 tablet (20 mEq total) by mouth 2 (two) times daily. 180 tablet 3   No current facility-administered medications for this visit.      Past Medical History:  Diagnosis Date  . Arthritis   . Heart murmur    refer to cardiologist note from dr Dietrich Pates  . Hyperlipidemia   . Hypertension   . Left ureteral calculus   . Wears dentures    upper  and  partial lower  . Wears glasses     ROS:   All systems reviewed and negative except as noted in the HPI.   Past Surgical History:  Procedure Laterality Date  . CARDIOVASCULAR STRESS TEST  03-31-2012   normal perfusion study/  no ischemia/  ef 69%  . COLONOSCOPY WITH ESOPHAGOGASTRODUODENOSCOPY (EGD)  09-21-2003  . CORNEAL TRANSPLANT Left 2002  . CYSTOSCOPY WITH URETEROSCOPY AND STENT PLACEMENT Left 01/07/2014   Procedure: CYSTOSCOPY WITH LEFT RETRGRADE, URETEROSCOPY , AND LASER LITHOTRIPSY STONE EXTRACTION AND LEFT STENT PLACEMENT;  Surgeon: Crist Fat, MD;  Location: [facility];  Service: Urology;  Laterality: Left;  . HOLMIUM LASER APPLICATION Left 01/07/2014   Procedure: HOLMIUM LASER APPLICATION;  Surgeon: Crist Fat, MD;  Location: [facility];  Service: Urology;  Laterality: Left;  . PACEMAKER IMPLANT N/A 07/15/2018   Procedure: PACEMAKER IMPLANT;  Surgeon: Marinus Maw, MD;  Location: [facility] INVASIVE CV LAB;  Service: Cardiovascular;  Laterality: N/A;  . REVISION TOTAL KNEE ARTHROPLASTY  left 08-01-2003/  right 02-20-02006  . TOTAL KNEE ARTHROPLASTY  left 03-30-2003/  right 11-05-2004   post op left knee I & D arthroscopic lavage 04-20-2003  . TRANSTHORACIC ECHOCARDIOGRAM  08-28-2011  dr Gunnar Fusi ross   moderate lvh/  ef  60-65%/  grade 2 diastolic dysfunction/ normal lv function / hyperdynamic lv with outflow murmur / turbulance through LVOT , mild SAM/ moderate lae/ moderate tr/  trivial mv & pv     Family History  Problem Relation Age of Onset  . Hypertension Father   . Hypertension Brother      Social History   Socioeconomic History  . Marital status: Legally Separated    Spouse name: Not on file  . Number of children: Not on file  . Years of education: Not on file  . Highest education level: Not on file  Occupational History  . Not on file  Social Needs  . Financial resource strain: Not on file  . Food insecurity:     Worry: Not on file    Inability: Not on file  . Transportation needs:    Medical: Not on file    Non-medical: Not on file  Tobacco Use  . Smoking status: Current Some Day Smoker    Packs/day: 0.50    Types: Cigarettes  . Smokeless tobacco: Never Used  . Tobacco comment: per pt occasional smoker  Substance and Sexual Activity  . Alcohol use: Yes    Alcohol/week: 3.0 standard drinks    Types: 3 Glasses of wine per week  . Drug use: No  . Sexual activity: Not on file  Lifestyle  . Physical activity:    Days per week: Not on file    Minutes per session: Not on file  . Stress: Not on file  Relationships  . Social connections:    Talks on phone: Not on file    Gets together: Not on file    Attends religious service: Not on file    Active member of club or organization: Not on file    Attends meetings of clubs or organizations: Not on file    Relationship status: Not on file  . Intimate partner violence:    Fear of current or ex partner: Not on file    Emotionally abused: Not on file    Physically abused: Not on file    Forced sexual activity: Not on file  Other Topics Concern  . 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 Occasional smoking and Etoh.     BP 116/68   Pulse 83   Ht 5\' 4"  (1.626 m)   Wt 220 lb (99.8 kg)   SpO2 97%   BMI 37.76 kg/m   Physical Exam:  Well appearing NAD HEENT: Unremarkable Neck:  No JVD, no thyromegally Lymphatics:  No adenopathy Back:  No CVA tenderness Lungs:  Clear with no wheezes HEART:  Regular rate rhythm, no murmurs, no rubs, no clicks Abd:  soft, positive bowel sounds, no organomegally, no rebound, no guarding Ext:  2 plus pulses, no edema, no cyanosis, no clubbing Skin:  No rashes no nodules Neuro:  CN II through XII intact, motor grossly intact  EKG - NSR  DEVICE  Normal device function.  See PaceArt for details.   Assess/Plan: 1.  Transient CHB - she is asymptomatic, s/p PPM insertion. 2. PPM - her medtronic DDD PM is working normally. We will recheck in several months. She has non-selective capture with threshold less than a volt. 3. Obesity  - she admits to dietary indiscretion. I encouraged her to work on this and to get back to the pool ASAP. 4. HTN - her blood pressure is  controlled today.  Leonia Reeves.D.

## 2018-11-20 DIAGNOSIS — R892 Abnormal level of other drugs, medicaments and biological substances in specimens from other organs, systems and tissues: Secondary | ICD-10-CM | POA: Diagnosis not present

## 2018-11-27 ENCOUNTER — Telehealth: Payer: Self-pay | Admitting: Internal Medicine

## 2018-11-27 NOTE — Telephone Encounter (Signed)
Pt can not get device to connect with Medtronic Carelink app on phone to send transmission. Pt has no current problems or issues . Not due for transmission until 01/14/19.  Will contact Carelink tech services . Contacted tech services and pt contacted and informed that [facility] tech services stated she is connected to system and program is running. Pt states she is not connected and that her bluetooth activated. Pt given Carelink tech services # to get guidance: [phone].

## 2018-12-01 DIAGNOSIS — R635 Abnormal weight gain: Secondary | ICD-10-CM | POA: Diagnosis not present

## 2018-12-01 DIAGNOSIS — Z1159 Encounter for screening for other viral diseases: Secondary | ICD-10-CM | POA: Diagnosis not present

## 2018-12-01 DIAGNOSIS — R5383 Other fatigue: Secondary | ICD-10-CM | POA: Diagnosis not present

## 2018-12-01 DIAGNOSIS — Z79899 Other long term (current) drug therapy: Secondary | ICD-10-CM | POA: Diagnosis not present

## 2018-12-01 DIAGNOSIS — R0602 Shortness of breath: Secondary | ICD-10-CM | POA: Diagnosis not present

## 2018-12-02 DIAGNOSIS — M47816 Spondylosis without myelopathy or radiculopathy, lumbar region: Secondary | ICD-10-CM | POA: Diagnosis not present

## 2018-12-02 DIAGNOSIS — Z79899 Other long term (current) drug therapy: Secondary | ICD-10-CM | POA: Diagnosis not present

## 2018-12-02 DIAGNOSIS — Z950 Presence of cardiac pacemaker: Secondary | ICD-10-CM | POA: Diagnosis not present

## 2018-12-02 DIAGNOSIS — M1990 Unspecified osteoarthritis, unspecified site: Secondary | ICD-10-CM | POA: Diagnosis not present

## 2018-12-02 DIAGNOSIS — L03031 Cellulitis of right toe: Secondary | ICD-10-CM | POA: Diagnosis not present

## 2018-12-04 DIAGNOSIS — L02611 Cutaneous abscess of right foot: Secondary | ICD-10-CM | POA: Diagnosis not present

## 2018-12-04 DIAGNOSIS — M79674 Pain in right toe(s): Secondary | ICD-10-CM | POA: Diagnosis not present

## 2018-12-04 DIAGNOSIS — L03031 Cellulitis of right toe: Secondary | ICD-10-CM | POA: Diagnosis not present

## 2018-12-08 DIAGNOSIS — Z79899 Other long term (current) drug therapy: Secondary | ICD-10-CM | POA: Diagnosis not present

## 2018-12-08 DIAGNOSIS — R635 Abnormal weight gain: Secondary | ICD-10-CM | POA: Diagnosis not present

## 2018-12-10 DIAGNOSIS — L03031 Cellulitis of right toe: Secondary | ICD-10-CM | POA: Diagnosis not present

## 2018-12-10 DIAGNOSIS — M79674 Pain in right toe(s): Secondary | ICD-10-CM | POA: Diagnosis not present

## 2018-12-10 DIAGNOSIS — L02611 Cutaneous abscess of right foot: Secondary | ICD-10-CM | POA: Diagnosis not present

## 2019-01-14 ENCOUNTER — Ambulatory Visit (INDEPENDENT_AMBULATORY_CARE_PROVIDER_SITE_OTHER): Payer: Medicare HMO | Admitting: *Deleted

## 2019-01-14 DIAGNOSIS — R001 Bradycardia, unspecified: Secondary | ICD-10-CM

## 2019-01-14 DIAGNOSIS — I442 Atrioventricular block, complete: Secondary | ICD-10-CM

## 2019-01-14 LAB — CUP PACEART REMOTE DEVICE CHECK
Battery Remaining Longevity: 180 mo
Battery Voltage: 3.18 V
Brady Statistic AP VP Percent: 0.01 %
Brady Statistic AP VS Percent: 2.66 %
Brady Statistic AS VP Percent: 0.04 %
Brady Statistic AS VS Percent: 97.29 %
Brady Statistic RA Percent Paced: 2.72 %
Brady Statistic RV Percent Paced: 0.05 %
Date Time Interrogation Session: 20200730063308
Implantable Lead Implant Date: 20200129
Implantable Lead Implant Date: 20200129
Implantable Lead Location: 753859
Implantable Lead Location: 753860
Implantable Lead Model: 3830
Implantable Lead Model: 5076
Implantable Pulse Generator Implant Date: 20200129
Lead Channel Impedance Value: 342 Ohm
Lead Channel Impedance Value: 361 Ohm
Lead Channel Impedance Value: 513 Ohm
Lead Channel Impedance Value: 551 Ohm
Lead Channel Pacing Threshold Amplitude: 0.75 V
Lead Channel Pacing Threshold Amplitude: 0.875 V
Lead Channel Pacing Threshold Pulse Width: 0.4 ms
Lead Channel Pacing Threshold Pulse Width: 0.4 ms
Lead Channel Sensing Intrinsic Amplitude: 15.125 mV
Lead Channel Sensing Intrinsic Amplitude: 2 mV
Lead Channel Setting Pacing Amplitude: 1.75 V
Lead Channel Setting Pacing Amplitude: 2 V
Lead Channel Setting Pacing Pulse Width: 0.4 ms
Lead Channel Setting Sensing Sensitivity: 1.2 mV

## 2019-01-21 ENCOUNTER — Encounter: Payer: Self-pay | Admitting: Cardiology

## 2019-01-21 NOTE — Progress Notes (Signed)
Remote pacemaker transmission.   

## 2019-02-25 ENCOUNTER — Telehealth: Payer: Self-pay | Admitting: Internal Medicine

## 2019-02-25 NOTE — Telephone Encounter (Signed)
Follow Up:     Pt calling, about device  not connecting.

## 2019-02-25 NOTE — Telephone Encounter (Signed)
New message   Patient is having an issue with the connecting the device to her phone for a transmission. Please call to discuss.

## 2019-02-26 NOTE — Telephone Encounter (Signed)
Spoke with patient. She reports she is currently at the phone store, trying to update her app as she got a new phone a few months ago and just realized the app did not transfer. Attempted to assist patient and phone rep with installing [facility] app. It is not available in the app store on her phone. Offered to order Carelink relay monitor, which patient is agreeable to. Advised it should arrive within 14 business days, gave direct DC phone number to call when she receives it for setup assistance. Pt verbalizes understanding and agreement with plan.   Carelink Relay [id number] monitor ordered via Medtronic Stay Connected. Should arrive within 7-10 business days.

## 2019-03-03 ENCOUNTER — Encounter: Payer: Self-pay | Admitting: Cardiology

## 2019-03-04 NOTE — Telephone Encounter (Signed)
Carelink Relay monitor shipped 03/01/19.

## 2019-03-10 NOTE — Telephone Encounter (Signed)
Spoke w/ pt and informed her that we received the transmission from her new monitor. Pt verbalized understanding.

## 2019-04-09 ENCOUNTER — Other Ambulatory Visit: Payer: Self-pay | Admitting: Internal Medicine

## 2019-04-15 ENCOUNTER — Ambulatory Visit (INDEPENDENT_AMBULATORY_CARE_PROVIDER_SITE_OTHER): Payer: Medicare Other | Admitting: *Deleted

## 2019-04-15 DIAGNOSIS — I442 Atrioventricular block, complete: Secondary | ICD-10-CM | POA: Diagnosis not present

## 2019-04-15 DIAGNOSIS — G459 Transient cerebral ischemic attack, unspecified: Secondary | ICD-10-CM

## 2019-04-15 LAB — CUP PACEART REMOTE DEVICE CHECK
Battery Remaining Longevity: 177 mo
Battery Voltage: 3.15 V
Brady Statistic AP VP Percent: 0.01 %
Brady Statistic AP VS Percent: 1.03 %
Brady Statistic AS VP Percent: 0.05 %
Brady Statistic AS VS Percent: 98.9 %
Brady Statistic RA Percent Paced: 1.06 %
Brady Statistic RV Percent Paced: 0.07 %
Date Time Interrogation Session: 20201029083952
Implantable Lead Implant Date: 20200129
Implantable Lead Implant Date: 20200129
Implantable Lead Location: 753859
Implantable Lead Location: 753860
Implantable Lead Model: 3830
Implantable Lead Model: 5076
Implantable Pulse Generator Implant Date: 20200129
Lead Channel Impedance Value: 342 Ohm
Lead Channel Impedance Value: 361 Ohm
Lead Channel Impedance Value: 494 Ohm
Lead Channel Impedance Value: 513 Ohm
Lead Channel Pacing Threshold Amplitude: 0.75 V
Lead Channel Pacing Threshold Amplitude: 1 V
Lead Channel Pacing Threshold Pulse Width: 0.4 ms
Lead Channel Pacing Threshold Pulse Width: 0.4 ms
Lead Channel Sensing Intrinsic Amplitude: 16.25 mV
Lead Channel Sensing Intrinsic Amplitude: 2.625 mV
Lead Channel Setting Pacing Amplitude: 1.5 V
Lead Channel Setting Pacing Amplitude: 2 V
Lead Channel Setting Pacing Pulse Width: 0.4 ms
Lead Channel Setting Sensing Sensitivity: 1.2 mV

## 2019-05-07 NOTE — Progress Notes (Signed)
Remote pacemaker transmission.   

## 2019-06-01 ENCOUNTER — Other Ambulatory Visit: Payer: Self-pay | Admitting: Physician Assistant

## 2019-07-15 ENCOUNTER — Ambulatory Visit (INDEPENDENT_AMBULATORY_CARE_PROVIDER_SITE_OTHER): Payer: Medicare Other | Admitting: *Deleted

## 2019-07-15 DIAGNOSIS — I442 Atrioventricular block, complete: Secondary | ICD-10-CM | POA: Diagnosis not present

## 2019-07-15 LAB — CUP PACEART REMOTE DEVICE CHECK
Battery Remaining Longevity: 174 mo
Battery Voltage: 3.12 V
Brady Statistic AP VP Percent: 0.03 %
Brady Statistic AP VS Percent: 1.99 %
Brady Statistic AS VP Percent: 0.26 %
Brady Statistic AS VS Percent: 97.72 %
Brady Statistic RA Percent Paced: 2 %
Brady Statistic RV Percent Paced: 0.29 %
Date Time Interrogation Session: 20210128054221
Implantable Lead Implant Date: 20200129
Implantable Lead Implant Date: 20200129
Implantable Lead Location: 753859
Implantable Lead Location: 753860
Implantable Lead Model: 3830
Implantable Lead Model: 5076
Implantable Pulse Generator Implant Date: 20200129
Lead Channel Impedance Value: 342 Ohm
Lead Channel Impedance Value: 361 Ohm
Lead Channel Impedance Value: 532 Ohm
Lead Channel Impedance Value: 551 Ohm
Lead Channel Pacing Threshold Amplitude: 0.875 V
Lead Channel Pacing Threshold Amplitude: 1 V
Lead Channel Pacing Threshold Pulse Width: 0.4 ms
Lead Channel Pacing Threshold Pulse Width: 0.4 ms
Lead Channel Sensing Intrinsic Amplitude: 19 mV
Lead Channel Sensing Intrinsic Amplitude: 19 mV
Lead Channel Sensing Intrinsic Amplitude: 4 mV
Lead Channel Sensing Intrinsic Amplitude: 4 mV
Lead Channel Setting Pacing Amplitude: 1.75 V
Lead Channel Setting Pacing Amplitude: 2 V
Lead Channel Setting Pacing Pulse Width: 0.4 ms
Lead Channel Setting Sensing Sensitivity: 1.2 mV

## 2019-07-15 NOTE — Progress Notes (Signed)
PPM Remote  

## 2019-08-31 ENCOUNTER — Ambulatory Visit (INDEPENDENT_AMBULATORY_CARE_PROVIDER_SITE_OTHER): Payer: Medicare Other | Admitting: Internal Medicine

## 2019-08-31 ENCOUNTER — Other Ambulatory Visit: Payer: Self-pay

## 2019-08-31 ENCOUNTER — Encounter: Payer: Self-pay | Admitting: Internal Medicine

## 2019-08-31 VITALS — BP 112/74 | HR 72 | Ht 64.0 in | Wt 220.0 lb

## 2019-08-31 DIAGNOSIS — I442 Atrioventricular block, complete: Secondary | ICD-10-CM

## 2019-08-31 DIAGNOSIS — I10 Essential (primary) hypertension: Secondary | ICD-10-CM

## 2019-08-31 DIAGNOSIS — Z950 Presence of cardiac pacemaker: Secondary | ICD-10-CM | POA: Insufficient documentation

## 2019-08-31 NOTE — Patient Instructions (Signed)

## 2019-08-31 NOTE — Progress Notes (Signed)
HPI Kelsey [name] returns today for followup. She is a pleasant 74 yo woman with CHB, s/p PPM insertion. She has had improvement in her AV conduction. She has been more sedentary. She would like to get back into the pool. No chest pain or sob. No edema.  Allergies  Allergen Reactions  . Bee Venom Anaphylaxis    ALL BEE STINGS  . Oxycodone-Acetaminophen Itching     Current Outpatient Medications  Medication Sig Dispense Refill  . amLODipine (NORVASC) 10 MG tablet Take 10 mg by mouth daily.    Marland Kitchen aspirin EC 81 MG tablet Take 81 mg by mouth daily.    Marland Kitchen atorvastatin (LIPITOR) 40 MG tablet Take 40 mg by mouth every evening.    . Calcium Carb-Cholecalciferol (CALCIUM + D3 PO) Take 1 capsule by mouth daily.    . chlorthalidone (HYGROTON) 25 MG tablet TAKE 1 TABLET BY MOUTH EVERY DAY 30 tablet 5  . EPINEPHrine (EPIPEN 2-PAK) 0.3 mg/0.3 mL IJ SOAJ injection Inject 0.3 mg into the muscle as needed for anaphylaxis.    . ferrous sulfate 325 (65 FE) MG EC tablet Take 1 tablet by mouth daily.    Marland Kitchen HYDROcodone-acetaminophen (NORCO) 10-325 MG tablet Take 1 tablet by mouth 2 (two) times daily as needed for severe pain.     . Multiple Vitamins-Minerals (MULTIVITAMIN WITH MINERALS) tablet Take 1 tablet by mouth daily.    . naproxen sodium (ALEVE) 220 MG tablet Take 220 mg by mouth daily as needed (pain).    Marland Kitchen timolol (BETIMOL) 0.5 % ophthalmic solution Place 1 drop into the left eye daily.    . potassium chloride SA (KLOR-CON M20) 20 MEQ tablet Take 1 tablet (20 mEq total) by mouth 2 (two) times daily. 180 tablet 3   No current facility-administered medications for this visit.     Past Medical History:  Diagnosis Date  . Arthritis   . Heart murmur    refer to cardiologist note from dr Dietrich Pates  . Hyperlipidemia   . Hypertension   . Left ureteral calculus   . Wears dentures    upper  and partial lower  . Wears glasses     ROS:   All systems reviewed and negative except as noted in  the HPI.   Past Surgical History:  Procedure Laterality Date  . CARDIOVASCULAR STRESS TEST  03-31-2012   normal perfusion study/  no ischemia/  ef 69%  . COLONOSCOPY WITH ESOPHAGOGASTRODUODENOSCOPY (EGD)  09-21-2003  . CORNEAL TRANSPLANT Left 2002  . CYSTOSCOPY WITH URETEROSCOPY AND STENT PLACEMENT Left 01/07/2014   Procedure: CYSTOSCOPY WITH LEFT RETRGRADE, URETEROSCOPY , AND LASER LITHOTRIPSY STONE EXTRACTION AND LEFT STENT PLACEMENT;  Surgeon: Crist Fat, MD;  Location: [facility];  Service: Urology;  Laterality: Left;  . HOLMIUM LASER APPLICATION Left 01/07/2014   Procedure: HOLMIUM LASER APPLICATION;  Surgeon: Crist Fat, MD;  Location: [facility];  Service: Urology;  Laterality: Left;  . PACEMAKER IMPLANT N/A 07/15/2018   Procedure: PACEMAKER IMPLANT;  Surgeon: Marinus Maw, MD;  Location: [facility] INVASIVE CV LAB;  Service: Cardiovascular;  Laterality: N/A;  . REVISION TOTAL KNEE ARTHROPLASTY  left 08-01-2003/  right 02-20-02006  . TOTAL KNEE ARTHROPLASTY  left 03-30-2003/  right 11-05-2004   post op left knee I & D arthroscopic lavage 04-20-2003  . TRANSTHORACIC ECHOCARDIOGRAM  08-28-2011  dr Gunnar Fusi ross   moderate lvh/  ef 60-65%/  grade 2 diastolic dysfunction/ normal lv function /  hyperdynamic lv with outflow murmur / turbulance through LVOT , mild SAM/ moderate lae/ moderate tr/  trivial mv & pv     Family History  Problem Relation Age of Onset  . Hypertension Father   . Hypertension Brother      Social History   Socioeconomic History  . Marital status: Legally Separated    Spouse name: Not on file  . Number of children: Not on file  . Years of education: Not on file  . Highest education level: Not on file  Occupational History  . Not on file  Tobacco Use  . Smoking status: Current Some Day Smoker    Packs/day: 0.50    Types: Cigarettes  . Smokeless tobacco: Never Used  . Tobacco comment: per pt occasional smoker   Substance and Sexual Activity  . Alcohol use: Yes    Alcohol/week: 3.0 standard drinks    Types: 3 Glasses of wine per week  . Drug use: No  . Sexual activity: Not on file  Other Topics Concern  . 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 Occasional smoking and Etoh.   Social Determinants of Health   Financial Resource Strain:   . Difficulty of Paying Living Expenses:   Food Insecurity:   . Worried About Charity fundraiser in the Last Year:   . Arboriculturist in the Last Year:   Transportation Needs:   . Film/video editor (Medical):   Marland Kitchen Lack of Transportation (Non-Medical):   Physical Activity:   . Days of Exercise per Week:   . Minutes of Exercise per Session:   Stress:   . Feeling of Stress :   Social Connections:   . Frequency of Communication with Friends and Family:   . Frequency of Social Gatherings with Friends and Family:   . Attends Religious Services:   . Active Member of Clubs or Organizations:   . Attends Archivist Meetings:   Marland Kitchen Marital Status:   Intimate Partner Violence:   . Fear of Current or Ex-Partner:   . Emotionally Abused:   Marland Kitchen Physically Abused:   . Sexually Abused:      BP 112/74   Pulse 72   Ht 5\' 4"  (1.626 m)   Wt 220 lb (99.8 kg)   SpO2 98%   BMI 37.76 kg/m   Physical Exam:  Well appearing NAD HEENT: Unremarkable Neck:  No JVD, no thyromegally Lymphatics:  No adenopathy Back:  No CVA tenderness Lungs:  Clear with no wheezes HEART:  Regular rate rhythm, no murmurs, no rubs, no clicks Abd:  soft, positive bowel sounds, no organomegally, no rebound, no guarding Ext:  2 plus pulses, no edema, no cyanosis, no clubbing Skin:  No rashes no nodules Neuro:  CN II through XII intact, motor grossly intact  EKG - nsr  DEVICE  Normal device function.  See PaceArt for details.   Assess/Plan: 1. CHB - she is asymptomatic, s/p PPM  insertion. 2. NS Atrial tachycardia - she is asymptomatic. She will continue her current meds. 3. PPM - her medtronic DDD PM is working normally.  4. HTN -her bp is well controlled. She is encouraged to lose weight.  Mikle Bosworth.D.

## 2019-09-02 ENCOUNTER — Other Ambulatory Visit: Payer: Self-pay | Admitting: Internal Medicine

## 2019-09-02 NOTE — Addendum Note (Signed)
Addended by: Solon Augusta on: 09/02/2019 10:47 AM   Modules accepted: Orders

## 2019-09-06 LAB — CUP PACEART INCLINIC DEVICE CHECK
Brady Statistic AP VP Percent: 0.1 % — CL
Brady Statistic AP VS Percent: 1.6 %
Brady Statistic AS VP Percent: 0.1 %
Brady Statistic AS VS Percent: 98.3 %
Brady Statistic RA Percent Paced: 1.6 %
Brady Statistic RV Percent Paced: 0.1 %
Date Time Interrogation Session: 20210316111718
Implantable Lead Implant Date: 20200129
Implantable Lead Implant Date: 20200129
Implantable Lead Location: 753859
Implantable Lead Location: 753860
Implantable Lead Model: 3830
Implantable Lead Model: 5076
Implantable Pulse Generator Implant Date: 20200129
Lead Channel Pacing Threshold Amplitude: 0.75 V
Lead Channel Pacing Threshold Amplitude: 1 V
Lead Channel Pacing Threshold Pulse Width: 0.4 ms
Lead Channel Pacing Threshold Pulse Width: 0.4 ms
Lead Channel Sensing Intrinsic Amplitude: 19 mV
Lead Channel Sensing Intrinsic Amplitude: 2.1 mV
Lead Channel Setting Pacing Amplitude: 1.75 V
Lead Channel Setting Pacing Amplitude: 2 V
Lead Channel Setting Pacing Pulse Width: 0.4 ms
Lead Channel Setting Sensing Sensitivity: 1.2 mV

## 2019-10-14 ENCOUNTER — Ambulatory Visit (INDEPENDENT_AMBULATORY_CARE_PROVIDER_SITE_OTHER): Payer: Medicare Other | Admitting: *Deleted

## 2019-10-14 DIAGNOSIS — I442 Atrioventricular block, complete: Secondary | ICD-10-CM | POA: Diagnosis not present

## 2019-10-14 LAB — CUP PACEART REMOTE DEVICE CHECK
Battery Remaining Longevity: 171 mo
Battery Voltage: 3.08 V
Brady Statistic AP VP Percent: 0.05 %
Brady Statistic AP VS Percent: 1.07 %
Brady Statistic AS VP Percent: 0.18 %
Brady Statistic AS VS Percent: 98.69 %
Brady Statistic RA Percent Paced: 1.11 %
Brady Statistic RV Percent Paced: 0.24 %
Date Time Interrogation Session: 20210428235106
Implantable Lead Implant Date: 20200129
Implantable Lead Implant Date: 20200129
Implantable Lead Location: 753859
Implantable Lead Location: 753860
Implantable Lead Model: 3830
Implantable Lead Model: 5076
Implantable Pulse Generator Implant Date: 20200129
Lead Channel Impedance Value: 323 Ohm
Lead Channel Impedance Value: 361 Ohm
Lead Channel Impedance Value: 513 Ohm
Lead Channel Impedance Value: 532 Ohm
Lead Channel Pacing Threshold Amplitude: 0.875 V
Lead Channel Pacing Threshold Amplitude: 1.125 V
Lead Channel Pacing Threshold Pulse Width: 0.4 ms
Lead Channel Pacing Threshold Pulse Width: 0.4 ms
Lead Channel Sensing Intrinsic Amplitude: 2.375 mV
Lead Channel Sensing Intrinsic Amplitude: 2.375 mV
Lead Channel Sensing Intrinsic Amplitude: 21 mV
Lead Channel Sensing Intrinsic Amplitude: 21 mV
Lead Channel Setting Pacing Amplitude: 1.75 V
Lead Channel Setting Pacing Amplitude: 2.25 V
Lead Channel Setting Pacing Pulse Width: 0.4 ms
Lead Channel Setting Sensing Sensitivity: 1.2 mV

## 2019-10-15 NOTE — Progress Notes (Signed)
PPM Remote  

## 2019-10-19 ENCOUNTER — Other Ambulatory Visit: Payer: Self-pay | Admitting: Internal Medicine

## 2019-11-05 ENCOUNTER — Other Ambulatory Visit: Payer: Self-pay | Admitting: Internal Medicine

## 2019-12-03 ENCOUNTER — Other Ambulatory Visit: Payer: Self-pay | Admitting: Internal Medicine

## 2019-12-26 ENCOUNTER — Other Ambulatory Visit: Payer: Self-pay | Admitting: Internal Medicine

## 2020-01-13 ENCOUNTER — Ambulatory Visit (INDEPENDENT_AMBULATORY_CARE_PROVIDER_SITE_OTHER): Payer: Medicare Other | Admitting: *Deleted

## 2020-01-13 DIAGNOSIS — I442 Atrioventricular block, complete: Secondary | ICD-10-CM | POA: Diagnosis not present

## 2020-01-13 DIAGNOSIS — R001 Bradycardia, unspecified: Secondary | ICD-10-CM

## 2020-01-13 LAB — CUP PACEART REMOTE DEVICE CHECK
Battery Remaining Longevity: 168 mo
Battery Voltage: 3.06 V
Brady Statistic AP VP Percent: 0.03 %
Brady Statistic AP VS Percent: 0.94 %
Brady Statistic AS VP Percent: 0.1 %
Brady Statistic AS VS Percent: 98.94 %
Brady Statistic RA Percent Paced: 0.95 %
Brady Statistic RV Percent Paced: 0.13 %
Date Time Interrogation Session: 20210729060016
Implantable Lead Implant Date: 20200129
Implantable Lead Implant Date: 20200129
Implantable Lead Location: 753859
Implantable Lead Location: 753860
Implantable Lead Model: 3830
Implantable Lead Model: 5076
Implantable Pulse Generator Implant Date: 20200129
Lead Channel Impedance Value: 323 Ohm
Lead Channel Impedance Value: 323 Ohm
Lead Channel Impedance Value: 494 Ohm
Lead Channel Impedance Value: 551 Ohm
Lead Channel Pacing Threshold Amplitude: 1 V
Lead Channel Pacing Threshold Amplitude: 1.125 V
Lead Channel Pacing Threshold Pulse Width: 0.4 ms
Lead Channel Pacing Threshold Pulse Width: 0.4 ms
Lead Channel Sensing Intrinsic Amplitude: 1.875 mV
Lead Channel Sensing Intrinsic Amplitude: 1.875 mV
Lead Channel Sensing Intrinsic Amplitude: 15.625 mV
Lead Channel Sensing Intrinsic Amplitude: 15.625 mV
Lead Channel Setting Pacing Amplitude: 2 V
Lead Channel Setting Pacing Amplitude: 2.25 V
Lead Channel Setting Pacing Pulse Width: 0.4 ms
Lead Channel Setting Sensing Sensitivity: 1.2 mV

## 2020-01-17 NOTE — Progress Notes (Signed)
Remote pacemaker transmission.   

## 2020-02-05 ENCOUNTER — Other Ambulatory Visit: Payer: Self-pay

## 2020-02-05 ENCOUNTER — Ambulatory Visit
Admission: EM | Admit: 2020-02-05 | Discharge: 2020-02-05 | Disposition: A | Discharge status: Home / Self Care | Payer: Medicare Other | Attending: Physician Assistant | Admitting: Physician Assistant

## 2020-02-05 ENCOUNTER — Ambulatory Visit (INDEPENDENT_AMBULATORY_CARE_PROVIDER_SITE_OTHER): Payer: Medicare Other

## 2020-02-05 DIAGNOSIS — S91112A Laceration without foreign body of left great toe without damage to nail, initial encounter: Secondary | ICD-10-CM | POA: Diagnosis not present

## 2020-02-05 DIAGNOSIS — W273XXA Contact with needle (sewing), initial encounter: Secondary | ICD-10-CM

## 2020-02-05 DIAGNOSIS — S90452A Superficial foreign body, left great toe, initial encounter: Secondary | ICD-10-CM

## 2020-02-05 MED ORDER — CEPHALEXIN 500 MG PO CAPS: 500.0000 mg | ORAL_CAPSULE | Freq: Four times a day (QID) | ORAL | 0 refills | Status: DC

## 2020-02-05 NOTE — ED Triage Notes (Signed)
Patient presents with left great toe pain.  She states she removed part of a sewing needle from the tip of the toe last night but thinks there may still be a piece in her toe.

## 2020-02-05 NOTE — Discharge Instructions (Signed)
As discussed, foreign body in left great toe.  The radiologist has not read the x-ray, but you will be able to see the results on my chart.  Otherwise, orthopedic offices will be able to review the x-ray.  Start Keflex as directed to prevent infection given foreign body.  Can take ibuprofen/Tylenol for pain, ice compress to help with swelling.  Follow-up with podiatrist/orthopedics for further evaluation and management needed.  If significant pain, numbness to the toe, redness, puslike drainage, continue emergency department for further evaluation.

## 2020-02-05 NOTE — ED Provider Notes (Signed)
[facility]    CSN: [id number] Arrival date & time: 02/05/20  2841      History   Chief Complaint Chief Complaint  Patient presents with  . Toe injury    left great toe    HPI Kelsey [name] is a 74 y.o. female.   74 year old female comes in for left toe pain with possible foreign body. States removed part of a sewing needle from tip of the toe last night, and thinks there may be remaining pieces within the toe. No numbness/tingling, purulent drainage. Tetanus up to date     Past Medical History:  Diagnosis Date  . Arthritis   . Heart murmur    refer to cardiologist note from dr Dietrich Pates  . Hyperlipidemia   . Hypertension   . Left ureteral calculus   . Wears dentures    upper  and partial lower  . Wears glasses     Patient Active Problem List   Diagnosis Date Noted  . Pacemaker 08/31/2019  . Complete heart block (HCC) 07/15/2018  . TIA (transient ischemic attack) 06/03/2018  . Dizziness 06/03/2018  . CVA (cerebral vascular accident) (HCC) 05/31/2018  . Bradycardia 05/27/2018  . Moderate aortic stenosis 05/27/2018  . Osteoarthritis of both knees 07/02/2013  . Tobacco abuse 04/01/2012  . 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 Midsternal chest pain 04/01/2012  . CHEST PAIN-UNSPECIFIED 03/09/2010  . Hyperlipidemia 10/13/2009  . HYPERTENSION, BENIGN 10/13/2009  . UNSPECIFIED TACHYCARDIA 10/13/2009  . DYSPNEA 10/13/2009    Past Surgical History:  Procedure Laterality Date  . CARDIOVASCULAR STRESS TEST  03-31-2012   normal perfusion study/  no ischemia/  ef 69%  . COLONOSCOPY WITH ESOPHAGOGASTRODUODENOSCOPY (EGD)  09-21-2003  . CORNEAL TRANSPLANT Left 2002  . CYSTOSCOPY WITH URETEROSCOPY AND STENT PLACEMENT Left 01/07/2014   Procedure: CYSTOSCOPY WITH LEFT RETRGRADE, URETEROSCOPY , AND LASER LITHOTRIPSY STONE EXTRACTION AND LEFT STENT PLACEMENT;  Surgeon: Crist Fat, MD;  Location: [facility];  Service: Urology;  Laterality: Left;  . HOLMIUM LASER APPLICATION Left 01/07/2014   Procedure: HOLMIUM LASER APPLICATION;  Surgeon: Crist Fat, MD;  [facility]  Service: Urology;  Laterality: Left;  . PACEMAKER IMPLANT N/A 07/15/2018   Procedure: PACEMAKER IMPLANT;  Surgeon: Marinus Maw, MD;  Location: [facility];  Service: Cardiovascular;  Laterality: N/A;  . REVISION TOTAL KNEE ARTHROPLASTY  left 08-01-2003/  right 02-20-02006  . TOTAL KNEE ARTHROPLASTY  left 03-30-2003/  right 11-05-2004   post op left knee I & D arthroscopic lavage 04-20-2003  . TRANSTHORACIC ECHOCARDIOGRAM  08-28-2011  dr Gunnar Fusi ross   moderate lvh/  ef 60-65%/  grade 2 diastolic dysfunction/ normal lv function / hyperdynamic lv with outflow murmur / turbulance through LVOT , mild SAM/ moderate lae/ moderate tr/  trivial mv & pv    OB History   No obstetric history on file.      Home Medications    Prior to Admission medications   Medication Sig Start Date End Date Taking? Authorizing Provider  amLODipine (NORVASC) 10 MG tablet Take 10 mg by mouth daily. 08/11/19   [provider]  aspirin EC 81 MG tablet Take 81 mg by mouth daily.    [provider]  atorvastatin (LIPITOR) 40 MG tablet Take 40 mg by mouth every evening. 04/13/13   Pricilla Riffle, MD  Calcium Carb-Cholecalciferol (CALCIUM + D3 PO) Take  1 capsule by mouth daily.    [provider]  cephALEXin (KEFLEX) 500 MG capsule Take 1 capsule (500 mg total) by mouth 4 (four) times daily. 02/05/20   Cathie Hoops, Eevie Lapp V, PA-C  chlorthalidone (HYGROTON) 25 MG tablet TAKE 1 TABLET BY MOUTH EVERY DAY 12/28/19   Marinus Maw, MD  EPINEPHrine (EPIPEN 2-PAK) 0.3 mg/0.3 mL IJ SOAJ injection Inject 0.3 mg into the muscle as needed for anaphylaxis.    [provider]  ferrous sulfate 325 (65 FE) MG EC tablet Take 1 tablet by mouth daily. 05/06/19   [provider]    HYDROcodone-acetaminophen (NORCO) 10-325 MG tablet Take 1 tablet by mouth 2 (two) times daily as needed for severe pain.     [provider]  Multiple Vitamins-Minerals (MULTIVITAMIN WITH MINERALS) tablet Take 1 tablet by mouth daily.    [provider]  naproxen sodium (ALEVE) 220 MG tablet Take 220 mg by mouth daily as needed (pain).    [provider]  potassium chloride SA (KLOR-CON M20) 20 MEQ tablet Take 1 tablet (20 mEq total) by mouth 2 (two) times daily. 07/13/18 10/11/18  Marinus Maw, MD  timolol (BETIMOL) 0.5 % ophthalmic solution Place 1 drop into the left eye daily.    [provider]    Family History Family History  Problem Relation Age of Onset  . Hypertension Father   . Hypertension Brother     Social History Social History   Tobacco Use  . Smoking status: Current Some Day Smoker    Packs/day: 0.50    Types: Cigarettes  . Smokeless tobacco: Never Used  . Tobacco comment: per pt occasional smoker  Vaping Use  . Vaping Use: Never used  Substance Use Topics  . Alcohol use: Yes    Alcohol/week: 3.0 standard drinks    Types: 3 Glasses of wine per week  . Drug use: No     Allergies   Bee venom and Oxycodone-acetaminophen   Review of Systems Review of Systems  Reason unable to perform ROS: See HPI as above.     Physical Exam Triage Vital Signs ED Triage Vitals  Enc Vitals Group     BP 02/05/20 0913 119/70     Pulse Rate 02/05/20 0913 65     Resp 02/05/20 0913 18     Temp 02/05/20 0913 98.4 F (36.9 C)     Temp Source 02/05/20 0913 Oral     SpO2 02/05/20 0913 95 %     Weight --      Height --      Head Circumference --      Peak Flow --      Pain Score 02/05/20 0920 6     Pain Loc --      Pain Edu? --      Excl. in GC? --    No data found.  Updated Vital Signs BP 119/70 (BP Location: Left Arm)   Pulse 65   Temp 98.4 F (36.9 C) (Oral)   Resp 18   SpO2 95%   Physical Exam Constitutional:       General: She is not in acute distress.    Appearance: Normal appearance. She is well-developed. She is not toxic-appearing or diaphoretic.  HENT:     Head: Normocephalic and atraumatic.  Eyes:     Conjunctiva/sclera: Conjunctivae normal.     Pupils: Pupils are equal, round, and reactive to light.  Pulmonary:     Effort: Pulmonary effort  is normal. No respiratory distress.  Musculoskeletal:     Cervical back: Normal range of motion and neck supple.     Comments: Left great toe with mild swelling. Puncture wound to the distal toe directly inferior of nail. Patient tender to touch diffusely of DIP. Unable to feel foreign body. NVI  Skin:    General: Skin is warm and dry.  Neurological:     Mental Status: She is alert and oriented to person, place, and time.      UC Treatments / Results  Labs (all labs ordered are listed, but only abnormal results are displayed) Labs Reviewed - No data to display  EKG   Radiology DG Toe Great Left  Result Date: 02/05/2020 CLINICAL DATA:  Foreign body after stepping on broken sewing needle EXAM: LEFT GREAT TOE COMPARISON:  None. FINDINGS: 2 foreign bodies consistent with history of broken sewing needle, both in the plantar great toe. Longest and upper measures 2 cm in length. The lower measures 1 cm in length. No fracture or subluxation. IMPRESSION: Two foreign bodies, reportedly a broken sewing needle, seen in the plantar great toe. Electronically Signed   By: Marnee Spring M.D.   On: 02/05/2020 10:05    Procedures Procedures (including critical care time)  Medications Ordered in UC Medications - No data to display  Initial Impression / Assessment and Plan / UC Course  I have reviewed the triage vital signs and the nursing notes.  Pertinent labs & imaging results that were available during my care of the patient were reviewed by me and considered in my medical decision making (see chart for details).    Discussed xray results with patient.  No guidance equipment in office, and feel that I may be unable to remove 1cm foreign body on the lower measure as described on xray. Patient expresses understanding and would like to defer procedure to podiatrist. Will place patient on keflex at this time. Resources provided. Return precautions given.  Final Clinical Impressions(s) / UC Diagnoses   Final diagnoses:  Foreign body of skin of great toe, left, initial encounter   ED Prescriptions    Medication Sig Dispense Auth. Provider   cephALEXin (KEFLEX) 500 MG capsule Take 1 capsule (500 mg total) by mouth 4 (four) times daily. 28 capsule Belinda Fisher, PA-C     PDMP not reviewed this encounter.   Belinda Fisher, PA-C 02/06/20 [phone]

## 2020-02-09 ENCOUNTER — Telehealth: Payer: Self-pay | Admitting: *Deleted

## 2020-02-09 ENCOUNTER — Other Ambulatory Visit: Payer: Self-pay

## 2020-02-09 ENCOUNTER — Encounter: Payer: Self-pay | Admitting: Internal Medicine

## 2020-02-09 ENCOUNTER — Other Ambulatory Visit (HOSPITAL_COMMUNITY): Payer: Self-pay | Admitting: Orthopedic Surgery

## 2020-02-09 ENCOUNTER — Other Ambulatory Visit (HOSPITAL_COMMUNITY)
Admission: RE | Admit: 2020-02-09 | Discharge: 2020-02-09 | Disposition: A | Discharge status: Home / Self Care | Payer: Medicare Other | Source: Ambulatory Visit | Attending: Orthopedic Surgery | Admitting: Orthopedic Surgery

## 2020-02-09 ENCOUNTER — Encounter (HOSPITAL_BASED_OUTPATIENT_CLINIC_OR_DEPARTMENT_OTHER)
Admission: RE | Admit: 2020-02-09 | Discharge: 2020-02-09 | Disposition: A | Discharge status: Home / Self Care | Payer: Medicare Other | Source: Ambulatory Visit | Attending: Orthopedic Surgery | Admitting: Orthopedic Surgery

## 2020-02-09 ENCOUNTER — Encounter (HOSPITAL_BASED_OUTPATIENT_CLINIC_OR_DEPARTMENT_OTHER): Payer: Self-pay | Admitting: Orthopedic Surgery

## 2020-02-09 DIAGNOSIS — Z20822 Contact with and (suspected) exposure to covid-19: Secondary | ICD-10-CM | POA: Insufficient documentation

## 2020-02-09 DIAGNOSIS — Z01812 Encounter for preprocedural laboratory examination: Secondary | ICD-10-CM | POA: Insufficient documentation

## 2020-02-09 LAB — BASIC METABOLIC PANEL
Anion gap: 10 (ref 5–15)
BUN: 19 mg/dL (ref 8–23)
CO2: 28 mmol/L (ref 22–32)
Calcium: 9.9 mg/dL (ref 8.9–10.3)
Chloride: 96 mmol/L — ABNORMAL LOW (ref 98–111)
Creatinine, Ser: 1.04 mg/dL — ABNORMAL HIGH (ref 0.44–1.00)
GFR calc Af Amer: >60 mL/min (ref >60)
GFR calc non Af Amer: 54 mL/min — ABNORMAL LOW (ref >60)
Glucose, Bld: 100 mg/dL — ABNORMAL HIGH (ref 70–99)
Potassium: 3.3 mmol/L — ABNORMAL LOW (ref 3.5–5.1)
Sodium: 134 mmol/L — ABNORMAL LOW (ref 135–145)

## 2020-02-09 LAB — SARS CORONAVIRUS 2 (TAT 6-24 HRS): SARS Coronavirus 2: NEGATIVE

## 2020-02-09 NOTE — Progress Notes (Signed)
Lab results reviewed with Dr. Hyacinth Meeker.

## 2020-02-09 NOTE — Telephone Encounter (Signed)
   Primary Cardiologist: Dietrich Pates, MD  Chart reviewed and the patient was contacted today by phone as part of pre-operative protocol coverage. Given past medical history and time since last visit, based on ACC/AHA guidelines, Kelsey [name] would be at acceptable risk for the planned procedure without further cardiovascular testing.   Okay to hold aspirin preop and resume as soon is safe postop  I will route this recommendation to the requesting party via Epic fax function and remove from pre-op pool.  Please call with questions.  Corine Shelter, PA-C 02/09/2020, 9:54 AM

## 2020-02-09 NOTE — Progress Notes (Unsigned)
PERIOPERATIVE PRESCRIPTION FOR IMPLANTED CARDIAC DEVICE PROGRAMMING  Patient Information: Name:  Kelsey [name]  DOB:  [birth year]  MRN:  [id number]  {TIP - You do not have to delete this tip  -  Copy the info from the staff message sent by the PAT staff  then press F2 here and paste the information using CTL - V on the next line :[id number]}  Planned Procedure: Removal painful hardware left great toe  Surgeon: Dr Victorino Dike  Date of Procedure: 02-10-2020  Cautery will be used.  Position during surgery: supine   Please send documentation back to:  [facility] (Fax # [phone])   Device Information:  Clinic EP Physician:  [name], MD   Device Type:  Pacemaker Manufacturer and Phone #:  Medtronic: [id number] Pacemaker Dependent?:  No. Date of Last Device Check:  01/13/20 Normal Device Function?:  Yes.    Electrophysiologist's Recommendations:   Have magnet available.  Provide continuous ECG monitoring when magnet is used or reprogramming is to be performed.   Procedure should not interfere with device function.  No device programming or magnet placement needed.  Per Device Clinic Standing Orders, Lenor Coffin, RN  9:55 AM 02/09/2020

## 2020-02-09 NOTE — Progress Notes (Signed)

## 2020-02-09 NOTE — Telephone Encounter (Signed)
   [facility] Pre-operative Risk Assessment    HEARTCARE STAFF: - Please ensure there is not already an duplicate clearance open for this procedure. - Under Visit Info/Reason for Call, type in Other and utilize the format Clearance MM/DD/YY or Clearance TBD. Do not use dashes or single digits. - If request is for dental extraction, please clarify the # of teeth to be extracted.  Request for surgical clearance:  1. What type of surgery is being performed? REMOVAL OF FOREIGN BODY LEFT GREAT TOE   2. When is this surgery scheduled? 02/10/20   3. What type of clearance is required (medical clearance vs. Pharmacy clearance to hold med vs. Both)? MEDICAL  4. Are there any medications that need to be held prior to surgery and how long? ASA   5. Practice name and name of physician performing surgery? [facility]; DR. Jenny Reichmann [place]   6. What is the office phone number? [phone]   7.   What is the office fax number? [phone] ATTN: [place]  8.   Anesthesia type (None, local, MAC, general) ? MAC   Julaine Hua 02/09/2020, 8:57 AM  _________________________________________________________________   (provider comments below)

## 2020-02-10 ENCOUNTER — Encounter (HOSPITAL_BASED_OUTPATIENT_CLINIC_OR_DEPARTMENT_OTHER): Payer: Self-pay | Admitting: Orthopedic Surgery

## 2020-02-10 ENCOUNTER — Ambulatory Visit (HOSPITAL_BASED_OUTPATIENT_CLINIC_OR_DEPARTMENT_OTHER): Payer: Medicare Other | Admitting: Anesthesiology

## 2020-02-10 ENCOUNTER — Encounter (HOSPITAL_BASED_OUTPATIENT_CLINIC_OR_DEPARTMENT_OTHER)
Admission: RE | Disposition: A | Discharge status: Home / Self Care | Payer: Self-pay | Source: Home / Self Care | Attending: Orthopedic Surgery

## 2020-02-10 ENCOUNTER — Other Ambulatory Visit: Payer: Self-pay

## 2020-02-10 ENCOUNTER — Ambulatory Visit (HOSPITAL_BASED_OUTPATIENT_CLINIC_OR_DEPARTMENT_OTHER)
Admission: RE | Admit: 2020-02-10 | Discharge: 2020-02-10 | Disposition: A | Discharge status: Home / Self Care | Payer: Medicare Other | Attending: Orthopedic Surgery | Admitting: Orthopedic Surgery

## 2020-02-10 DIAGNOSIS — F17210 Nicotine dependence, cigarettes, uncomplicated: Secondary | ICD-10-CM | POA: Diagnosis not present

## 2020-02-10 DIAGNOSIS — Z6836 Body mass index (BMI) 36.0-36.9, adult: Secondary | ICD-10-CM | POA: Diagnosis not present

## 2020-02-10 DIAGNOSIS — I2510 Atherosclerotic heart disease of native coronary artery without angina pectoris: Secondary | ICD-10-CM | POA: Diagnosis not present

## 2020-02-10 DIAGNOSIS — W273XXA Contact with needle (sewing), initial encounter: Secondary | ICD-10-CM | POA: Insufficient documentation

## 2020-02-10 DIAGNOSIS — M1990 Unspecified osteoarthritis, unspecified site: Secondary | ICD-10-CM | POA: Insufficient documentation

## 2020-02-10 DIAGNOSIS — E785 Hyperlipidemia, unspecified: Secondary | ICD-10-CM | POA: Insufficient documentation

## 2020-02-10 DIAGNOSIS — Z8249 Family history of ischemic heart disease and other diseases of the circulatory system: Secondary | ICD-10-CM | POA: Insufficient documentation

## 2020-02-10 DIAGNOSIS — Z950 Presence of cardiac pacemaker: Secondary | ICD-10-CM | POA: Insufficient documentation

## 2020-02-10 DIAGNOSIS — Z7982 Long term (current) use of aspirin: Secondary | ICD-10-CM | POA: Insufficient documentation

## 2020-02-10 DIAGNOSIS — S91142A Puncture wound with foreign body of left great toe without damage to nail, initial encounter: Secondary | ICD-10-CM | POA: Insufficient documentation

## 2020-02-10 DIAGNOSIS — I10 Essential (primary) hypertension: Secondary | ICD-10-CM | POA: Insufficient documentation

## 2020-02-10 DIAGNOSIS — M795 Residual foreign body in soft tissue: Secondary | ICD-10-CM

## 2020-02-10 DIAGNOSIS — Z79899 Other long term (current) drug therapy: Secondary | ICD-10-CM | POA: Insufficient documentation

## 2020-02-10 HISTORY — PX: FOREIGN BODY REMOVAL: SHX962

## 2020-02-10 SURGERY — REMOVAL FOREIGN BODY EXTREMITY
Anesthesia: Monitor Anesthesia Care | Site: Toe | Laterality: Left

## 2020-02-10 MED ORDER — BUPIVACAINE-EPINEPHRINE 0.5% -1:200000 IJ SOLN
INTRAMUSCULAR | Status: DC | PRN
  Administered 2020-02-10: 10 mL

## 2020-02-10 MED ORDER — EPHEDRINE 5 MG/ML INJ
INTRAVENOUS | Status: AC
  Filled 2020-02-10: qty 10

## 2020-02-10 MED ORDER — BUPIVACAINE HCL (PF) 0.5 % IJ SOLN
INTRAMUSCULAR | Status: AC
  Filled 2020-02-10: qty 30

## 2020-02-10 MED ORDER — ONDANSETRON HCL 4 MG/2ML IJ SOLN
INTRAMUSCULAR | Status: AC
  Filled 2020-02-10: qty 2

## 2020-02-10 MED ORDER — SENNA 8.6 MG PO TABS: 2.0000 | ORAL_TABLET | Freq: Two times a day (BID) | ORAL | 0 refills | Status: DC

## 2020-02-10 MED ORDER — CEFAZOLIN SODIUM-DEXTROSE 2-4 GM/100ML-% IV SOLN
2.0000 g | INTRAVENOUS | Status: AC
  Administered 2020-02-10: 2 g via INTRAVENOUS

## 2020-02-10 MED ORDER — OXYCODONE HCL 5 MG PO TABS
5.0000 mg | ORAL_TABLET | Freq: Four times a day (QID) | ORAL | 0 refills | Status: AC | PRN
Start: 2020-02-10 — End: 2020-02-15

## 2020-02-10 MED ORDER — VANCOMYCIN HCL 500 MG IV SOLR
INTRAVENOUS | Status: DC | PRN
  Administered 2020-02-10: 500 mg via TOPICAL

## 2020-02-10 MED ORDER — PROPOFOL 10 MG/ML IV BOLUS
INTRAVENOUS | Status: AC
  Filled 2020-02-10: qty 20

## 2020-02-10 MED ORDER — LIDOCAINE-EPINEPHRINE 1 %-1:100000 IJ SOLN
INTRAMUSCULAR | Status: AC
  Filled 2020-02-10: qty 1

## 2020-02-10 MED ORDER — PROPOFOL 500 MG/50ML IV EMUL
INTRAVENOUS | Status: DC | PRN
  Administered 2020-02-10: 50 ug/kg/min via INTRAVENOUS

## 2020-02-10 MED ORDER — PROPOFOL 10 MG/ML IV BOLUS
INTRAVENOUS | Status: DC | PRN
  Administered 2020-02-10 (×3): 20 mg via INTRAVENOUS

## 2020-02-10 MED ORDER — PROMETHAZINE HCL 25 MG/ML IJ SOLN: 6.2500 mg | INTRAMUSCULAR | Status: DC | PRN

## 2020-02-10 MED ORDER — DOCUSATE SODIUM 100 MG PO CAPS: 100.0000 mg | ORAL_CAPSULE | Freq: Two times a day (BID) | ORAL | 0 refills | Status: DC

## 2020-02-10 MED ORDER — SODIUM CHLORIDE 0.9 % IV SOLN: INTRAVENOUS | Status: DC

## 2020-02-10 MED ORDER — FENTANYL CITRATE (PF) 100 MCG/2ML IJ SOLN
INTRAMUSCULAR | Status: DC | PRN
Start: 2020-02-10 — End: 2020-02-10
  Administered 2020-02-10: 50 ug via INTRAVENOUS

## 2020-02-10 MED ORDER — LIDOCAINE HCL (PF) 1 % IJ SOLN
INTRAMUSCULAR | Status: AC
  Filled 2020-02-10: qty 30

## 2020-02-10 MED ORDER — LACTATED RINGERS IV SOLN: INTRAVENOUS | Status: DC

## 2020-02-10 MED ORDER — CEFAZOLIN SODIUM-DEXTROSE 2-4 GM/100ML-% IV SOLN
INTRAVENOUS | Status: AC
  Filled 2020-02-10: qty 100

## 2020-02-10 MED ORDER — FENTANYL CITRATE (PF) 100 MCG/2ML IJ SOLN
INTRAMUSCULAR | Status: AC
  Filled 2020-02-10: qty 2

## 2020-02-10 MED ORDER — HYDROMORPHONE HCL 1 MG/ML IJ SOLN: 0.2500 mg | INTRAMUSCULAR | Status: DC | PRN

## 2020-02-10 SURGICAL SUPPLY — 71 items
APL PRP STRL LF DISP 70% ISPRP (MISCELLANEOUS) ×1
BAG DECANTER FOR FLEXI CONT (MISCELLANEOUS) ×3 IMPLANT
BANDAGE ESMARK 6X9 LF (GAUZE/BANDAGES/DRESSINGS) IMPLANT
BLADE SURG 15 STRL LF DISP TIS (BLADE) ×1 IMPLANT
BLADE SURG 15 STRL SS (BLADE) ×3
BNDG CMPR 9X4 STRL LF SNTH (GAUZE/BANDAGES/DRESSINGS) ×1
BNDG COHESIVE 2X5 TAN NS LF (GAUZE/BANDAGES/DRESSINGS) ×3 IMPLANT
BNDG COHESIVE 4X5 TAN STRL (GAUZE/BANDAGES/DRESSINGS) IMPLANT
BNDG COHESIVE 6X5 TAN STRL LF (GAUZE/BANDAGES/DRESSINGS) IMPLANT
BNDG CONFORM 2 STRL LF (GAUZE/BANDAGES/DRESSINGS) ×3 IMPLANT
BNDG ELASTIC 4X5.8 VLCR STR LF (GAUZE/BANDAGES/DRESSINGS) IMPLANT
BNDG ESMARK 4X9 LF (GAUZE/BANDAGES/DRESSINGS) ×3 IMPLANT
BNDG ESMARK 6X9 LF (GAUZE/BANDAGES/DRESSINGS)
CHLORAPREP W/TINT 26 (MISCELLANEOUS) ×3 IMPLANT
CLOSURE WOUND 1/2 X4 (GAUZE/BANDAGES/DRESSINGS)
COVER BACK TABLE 60X90IN (DRAPES) ×3 IMPLANT
COVER WAND RF STERILE (DRAPES) IMPLANT
CUFF TOURN SGL QUICK 34 (TOURNIQUET CUFF)
CUFF TRNQT CYL 34X4.125X (TOURNIQUET CUFF) IMPLANT
DECANTER SPIKE VIAL GLASS SM (MISCELLANEOUS) IMPLANT
DRAIN PENROSE 1/2X12 LTX STRL (WOUND CARE) ×3 IMPLANT
DRAPE EXTREMITY T 121X128X90 (DISPOSABLE) ×3 IMPLANT
DRAPE OEC MINIVIEW 54X84 (DRAPES) ×3 IMPLANT
DRAPE SURG 17X23 STRL (DRAPES) IMPLANT
DRAPE U-SHAPE 47X51 STRL (DRAPES) ×3 IMPLANT
DRSG PAD ABDOMINAL 8X10 ST (GAUZE/BANDAGES/DRESSINGS) IMPLANT
ELECT REM PT RETURN 9FT ADLT (ELECTROSURGICAL) ×3
ELECTRODE REM PT RTRN 9FT ADLT (ELECTROSURGICAL) ×1 IMPLANT
GAUZE SPONGE 4X4 12PLY STRL (GAUZE/BANDAGES/DRESSINGS) ×3 IMPLANT
GLOVE BIO SURGEON STRL SZ8 (GLOVE) ×3 IMPLANT
GLOVE BIOGEL PI IND STRL 6.5 (GLOVE) ×1 IMPLANT
GLOVE BIOGEL PI IND STRL 8 (GLOVE) ×2 IMPLANT
GLOVE BIOGEL PI INDICATOR 6.5 (GLOVE) ×2
GLOVE BIOGEL PI INDICATOR 8 (GLOVE) ×4
GLOVE ECLIPSE 6.5 STRL STRAW (GLOVE) ×3 IMPLANT
GLOVE ECLIPSE 8.0 STRL XLNG CF (GLOVE) ×3 IMPLANT
GOWN STRL REUS W/ TWL LRG LVL3 (GOWN DISPOSABLE) ×1 IMPLANT
GOWN STRL REUS W/ TWL XL LVL3 (GOWN DISPOSABLE) ×2 IMPLANT
GOWN STRL REUS W/TWL LRG LVL3 (GOWN DISPOSABLE) ×3
GOWN STRL REUS W/TWL XL LVL3 (GOWN DISPOSABLE) ×6
NEEDLE HYPO 22GX1.5 SAFETY (NEEDLE) ×3 IMPLANT
NEEDLE HYPO 25X1 1.5 SAFETY (NEEDLE) ×3 IMPLANT
PACK BASIN DAY SURGERY FS (CUSTOM PROCEDURE TRAY) ×3 IMPLANT
PAD CAST 4YDX4 CTTN HI CHSV (CAST SUPPLIES) ×1 IMPLANT
PADDING CAST ABS 4INX4YD NS (CAST SUPPLIES)
PADDING CAST ABS COTTON 4X4 ST (CAST SUPPLIES) IMPLANT
PADDING CAST COTTON 4X4 STRL (CAST SUPPLIES) ×3
PADDING CAST COTTON 6X4 STRL (CAST SUPPLIES) IMPLANT
PENCIL SMOKE EVACUATOR (MISCELLANEOUS) ×3 IMPLANT
SANITIZER HAND PURELL 535ML FO (MISCELLANEOUS) ×3 IMPLANT
SHEET MEDIUM DRAPE 40X70 STRL (DRAPES) ×3 IMPLANT
SPLINT FAST PLASTER 5X30 (CAST SUPPLIES)
SPLINT PLASTER CAST FAST 5X30 (CAST SUPPLIES) IMPLANT
SPONGE LAP 18X18 RF (DISPOSABLE) ×3 IMPLANT
STAPLER VISISTAT 35W (STAPLE) IMPLANT
STOCKINETTE 6  STRL (DRAPES) ×3
STOCKINETTE 6 STRL (DRAPES) ×1 IMPLANT
STRIP CLOSURE SKIN 1/2X4 (GAUZE/BANDAGES/DRESSINGS) IMPLANT
SUCTION FRAZIER HANDLE 10FR (MISCELLANEOUS)
SUCTION TUBE FRAZIER 10FR DISP (MISCELLANEOUS) IMPLANT
SUT ETHILON 3 0 PS 1 (SUTURE) ×3 IMPLANT
SUT MNCRL AB 3-0 PS2 18 (SUTURE) IMPLANT
SUT MNCRL AB 4-0 PS2 18 (SUTURE) IMPLANT
SUT VIC AB 0 SH 27 (SUTURE) IMPLANT
SUT VIC AB 2-0 SH 27 (SUTURE)
SUT VIC AB 2-0 SH 27XBRD (SUTURE) IMPLANT
SYR BULB EAR ULCER 3OZ GRN STR (SYRINGE) ×3 IMPLANT
TOWEL GREEN STERILE FF (TOWEL DISPOSABLE) ×3 IMPLANT
TUBE CONNECTING 20'X1/4 (TUBING)
TUBE CONNECTING 20X1/4 (TUBING) IMPLANT
UNDERPAD 30X36 HEAVY ABSORB (UNDERPADS AND DIAPERS) ×3 IMPLANT

## 2020-02-10 NOTE — Anesthesia Preprocedure Evaluation (Addendum)
Anesthesia Evaluation  Patient identified by MRN, date of birth, ID band Patient awake    Reviewed: Allergy & Precautions, H&P , NPO status , Patient's Chart, lab work & pertinent test results  Airway Mallampati: II  TM Distance: >3 FB Neck ROM: Full    Dental no notable dental hx. (+) Upper Dentures, Partial Lower, Dental Advisory Given   Pulmonary Current Smoker and Patient abstained from smoking.,    Pulmonary exam normal breath sounds clear to auscultation       Cardiovascular hypertension, Pt. on home beta blockers and Pt. on medications Normal cardiovascular exam Rhythm:Regular Rate:Normal    Findings: SPECT imaging demonstrates no reversible or irreversible defects to suggest ischemia or infarct.   Quantitative gated analysis shows normal wall motion.   The resting left ventricular ejection fraction is 69% with end-  Aortic valve:   Structurally normal valve.   Cusp separation was normal.  Doppler:  Transvalvular velocity was within the normal range. There was no stenosis.  No regurgitation. The proximal septum is moderately thickened and the LV outflow tract is very narrow with mild SAM and a mild to moderate graident which worsens with Valsalva.  diastolic volume of 89 ml and end-systolic volume of 28 ml.   IMPRESSION: No evidence of ischemia or infarction.   Ejection fraction 69%.    Neuro/Psych negative neurological ROS  negative psych ROS   GI/Hepatic negative GI ROS, Neg liver ROS,   Endo/Other  Morbid obesity  Renal/GU negative Renal ROS  negative genitourinary   Musculoskeletal  (+) Arthritis , Osteoarthritis,    Abdominal (+) + obese,   Peds negative pediatric ROS (+)  Hematology negative hematology ROS (+)   Anesthesia Other Findings   Reproductive/Obstetrics negative OB ROS                             Anesthesia Physical  Anesthesia Plan  ASA:  III  Anesthesia Plan: MAC   Post-op Pain Management:    Induction: Intravenous  PONV Risk Score and Plan: 1 and Ondansetron and Treatment may vary due to age or medical condition  Airway Management Planned: Simple Face Mask  Additional Equipment:   Intra-op Plan:   Post-operative Plan:   Informed Consent: I have reviewed the patients History and Physical, chart, labs and discussed the procedure including the risks, benefits and alternatives for the proposed anesthesia with the patient or authorized representative who has indicated his/her understanding and acceptance.     Dental advisory given  Plan Discussed with: CRNA and Surgeon  Anesthesia Plan Comments:         Anesthesia Quick Evaluation

## 2020-02-10 NOTE — Op Note (Signed)
02/10/2020  12:25 PM  PATIENT:  Kelsey [name]  74 y.o. female  PRE-OPERATIVE DIAGNOSIS:  Painful retained foreign body of left great toe  POST-OPERATIVE DIAGNOSIS:  Painful retained foreign body of left great toe  Procedure(s):  Complicated Removal of retained foreign body of left great toe  SURGEON:  Toni Arthurs, MD  ASSISTANT: Alfredo Martinez, PA-C  ANESTHESIA:   General, regional  EBL:  minimal   TOURNIQUET:  approx 20 min with hallux penrose tourniquet  COMPLICATIONS:  None apparent  DISPOSITION:  Extubated, awake and stable to recovery.  INDICATION FOR PROCEDURE: The patient is a 74 year old female with a past medical history significant for coronary artery disease.  She stepped on a sewing needle at home this past weekend.  She presents now for removal of the retained fragments of the needle within her left hallux.  The risks and benefits of the alternative treatment options have been discussed in detail.  The patient wishes to proceed with surgery and specifically understands risks of bleeding, infection, nerve damage, blood clots, need for additional surgery, amputation and death.  PROCEDURE IN DETAIL: After preoperati meant was removed ve consent was obtained and the correct operative site was identified, the patient was brought the operating room and placed upon the operating table.  Preoperative antibiotics were administered.  Surgical timeout was taken.  IV sedation was administered.  A digital block was performed with half percent Marcaine with epinephrine.  The hallux was exsanguinated and a Penrose drain placed around the base of the toe.  The puncture wound was identified.  A small incision was made at the tip of the toe.  AP and lateral radiographs were obtained showing the largest fragment of the needle.  A hemostat was placed within the wound and identified the needle.  This fragment was removed without difficulty.  The second fragment was then identified plantar  laterally.  An adjacent incision was made.  Blunt dissection was carried down to the needle fragment.  Again using AP and lateral fluoroscopic views this fragment was removed without difficulty.  A third fragment was identified deep to the distal phalanx.  The distal incision was extended.  Blunt dissection was carried down to this fragment.  It was visualized and grasped with a rondure and removed.  Both wounds were then irrigated copiously and sprinkled with vancomycin powder.  Final AP and lateral radiographs confirmed complete removal of all radiopaque foreign bodies.  The incisions were closed with nylon.  Sterile dressings were applied followed by compression wrap.  The tourniquet was released after application of the dressings.  The patient was awakened from anesthesia and transported to the recovery room in stable condition.   FOLLOW UP PLAN: Weightbearing as tolerated on the left foot in a flat postop shoe.  Follow-up with me in the office in 2 weeks for suture removal.  Complete the antibiotics previously prescribed.  RADIOGRAPHS: AP and lateral radiographs of the left foot are obtained intraoperatively.  These show interval removal of radiopaque foreign bodies from the hallux.  No other acute injuries are noted.    Alfredo Martinez PA-C was present and scrubbed for the duration of the operative case. His assistance was essential in positioning the patient, prepping and draping, gaining and maintaining exposure, performing the operation, closing and dressing the wounds and applying the splint.

## 2020-02-10 NOTE — Transfer of Care (Signed)
Immediate Anesthesia Transfer of Care Note  Patient: Kelsey [name]  Procedure(s) Performed: Removal of retained foreign body of left great toe (Left Toe)  Patient Location: PACU  Anesthesia Type:MAC  Level of Consciousness: drowsy, patient cooperative and responds to stimulation  Airway & Oxygen Therapy: Patient Spontanous Breathing and Patient connected to face mask oxygen  Post-op Assessment: Report given to RN and Post -op Vital signs reviewed and stable  Post vital signs: Reviewed and stable  Last Vitals:  Vitals Value Taken Time  BP 105/74 02/10/20 1220  Temp    Pulse    Resp 19 02/10/20 1222  SpO2    Vitals shown include unvalidated device data.  Last Pain:  Vitals:   02/10/20 1029  TempSrc: Oral  PainSc: 7       Patients Stated Pain Goal: 4 (57/84/69 6295)  Complications: No complications documented.

## 2020-02-10 NOTE — Anesthesia Postprocedure Evaluation (Signed)
Anesthesia Post Note  Patient: Kelsey [name]  Procedure(s) Performed: Removal of retained foreign body of left great toe (Left Toe)     Patient location during evaluation: Phase II Anesthesia Type: MAC Level of consciousness: awake Pain management: pain level controlled Vital Signs Assessment: post-procedure vital signs reviewed and stable Respiratory status: spontaneous breathing Cardiovascular status: stable Postop Assessment: no apparent nausea or vomiting Anesthetic complications: no   No complications documented.  Last Vitals:  Vitals:   02/10/20 1230 02/10/20 1245  BP: 121/90 123/71  Pulse: (!) 105 69  Resp: 17 17  Temp:    SpO2: 100% 99%    Last Pain:  Vitals:   02/10/20 1245  TempSrc:   PainSc: 0-No pain                 Huston Foley

## 2020-02-10 NOTE — Anesthesia Procedure Notes (Signed)
Date/Time: 02/10/2020 11:22 AM Performed by: Thornell Mule, CRNA Oxygen Delivery Method: Simple face mask

## 2020-02-10 NOTE — H&P (Signed)
Kelsey [name] is an 74 y.o. female.   Chief Complaint: Left hallux pain   HPI: The patient is a 74 year old female without significant past medical history.  She stepped on a sewing needle at her home this past weekend.  On Monday she presented to urgent care.  Radiographs reveal a sewing needle broken into several pieces within the subcutaneous tissues adjacent to the bone.  She presents now for removal of these foreign bodies from her left hallux.  Past Medical History:  Diagnosis Date  . Arthritis    OA  . Heart murmur    refer to cardiologist note from dr Dietrich Pates  . Hyperlipidemia   . Hypertension   . Left ureteral calculus   . 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 Wears dentures    upper  and partial lower  . Wears glasses     Past Surgical History:  Procedure Laterality Date  . ABDOMINAL HYSTERECTOMY    . CARDIOVASCULAR STRESS TEST  03-31-2012   normal perfusion study/  no ischemia/  ef 69%  . COLONOSCOPY WITH ESOPHAGOGASTRODUODENOSCOPY (EGD)  09-21-2003  . CORNEAL TRANSPLANT Left 2002  . CYSTOSCOPY WITH URETEROSCOPY AND STENT PLACEMENT Left 01/07/2014   Procedure: CYSTOSCOPY WITH LEFT RETRGRADE, URETEROSCOPY , AND LASER LITHOTRIPSY STONE EXTRACTION AND LEFT STENT PLACEMENT;  Surgeon: Crist Fat, MD;  Location: [facility];  Service: Urology;  Laterality: Left;  . HOLMIUM LASER APPLICATION Left 01/07/2014   Procedure: HOLMIUM LASER APPLICATION;  Surgeon: Crist Fat, MD;  Location[facility];  Service: Urology;  Laterality: Left;  . PACEMAKER IMPLANT N/A 07/15/2018   Procedure: PACEMAKER IMPLANT;  Surgeon: Marinus Maw, MD;  Location: [facility] INVASIVE CV LAB;  Service: Cardiovascular;  Laterality: N/A;  . REVISION TOTAL KNEE ARTHROPLASTY  left 08-01-2003/  right 02-20-02006  . TOTAL KNEE ARTHROPLASTY  left 03-30-2003/  right 11-05-2004   post op left knee I & D arthroscopic lavage 04-20-2003  .  TRANSTHORACIC ECHOCARDIOGRAM  08-28-2011  dr Gunnar Fusi ross   moderate lvh/  ef 60-65%/  grade 2 diastolic dysfunction/ normal lv function / hyperdynamic lv with outflow murmur / turbulance through LVOT , mild SAM/ moderate lae/ moderate tr/  trivial mv & pv    Family History  Problem Relation Age of Onset  . Hypertension Father   . Hypertension Brother    Social History:  reports that she has been smoking cigarettes. She has been smoking about 0.50 packs per day. She has never used smokeless tobacco. She reports current alcohol use of about 3.0 standard drinks of alcohol per week. She reports that she does not use drugs.  Allergies:  Allergies  Allergen Reactions  . Bee Venom Anaphylaxis    ALL BEE STINGS  . Oxycodone-Acetaminophen Itching    Medications Prior to Admission  Medication Sig Dispense Refill  . amLODipine (NORVASC) 10 MG tablet Take 10 mg by mouth daily.    Marland Kitchen aspirin EC 81 MG tablet Take 81 mg by mouth daily.    Marland Kitchen atorvastatin (LIPITOR) 40 MG tablet Take 40 mg by mouth every evening.    . Calcium Carb-Cholecalciferol (CALCIUM + D3 PO) Take 1 capsule by mouth daily.    . cephALEXin (KEFLEX) 500 MG capsule Take 1 capsule (500 mg total) by mouth 4 (four) times daily. 28 capsule 0  . chlorthalidone (HYGROTON) 25 MG tablet TAKE 1 TABLET BY MOUTH EVERY DAY 90 tablet 3  . ferrous sulfate 325 (  65 FE) MG EC tablet Take 1 tablet by mouth daily.    Marland Kitchen HYDROcodone-acetaminophen (NORCO) 10-325 MG tablet Take 1 tablet by mouth 2 (two) times daily as needed for severe pain.     . Multiple Vitamins-Minerals (MULTIVITAMIN WITH MINERALS) tablet Take 1 tablet by mouth daily.    . naproxen sodium (ALEVE) 220 MG tablet Take 220 mg by mouth daily as needed (pain).    . potassium chloride SA (KLOR-CON M20) 20 MEQ tablet Take 1 tablet (20 mEq total) by mouth 2 (two) times daily. 180 tablet 3  . timolol (BETIMOL) 0.5 % ophthalmic solution Place 1 drop into the left eye daily.    Marland Kitchen EPINEPHrine  (EPIPEN 2-PAK) 0.3 mg/0.3 mL IJ SOAJ injection Inject 0.3 mg into the muscle as needed for anaphylaxis.      Results for orders placed or performed during the hospital encounter of 11-Feb-2020 (from the past 48 hour(s))  Basic metabolic panel per protocol     Status: Abnormal   Collection Time: 02/09/20 12:00 PM  Result Value Ref Range   Sodium 134 (L) 135 - 145 mmol/L   Potassium 3.3 (L) 3.5 - 5.1 mmol/L   Chloride 96 (L) 98 - 111 mmol/L   CO2 28 22 - 32 mmol/L   Glucose, Bld 100 (H) 70 - 99 mg/dL    Comment: Glucose reference range applies only to samples taken after fasting for at least 8 hours.   BUN 19 8 - 23 mg/dL   Creatinine, Ser 7.25 (H) 0.44 - 1.00 mg/dL   Calcium 9.9 8.9 - 36.6 mg/dL   GFR calc non Af Amer 54 (L) >60 mL/min   GFR calc Af Amer >60 >60 mL/min   Anion gap 10 5 - 15    Comment: Performed at [facility], [address], [place], [place] [address]   No results found.  Review of Systems no recent fever, chills, nausea, vomiting or changes in her appetite  Blood pressure 108/80, pulse 72, temperature 98.4 F (36.9 C), temperature source Oral, resp. rate 18, height 5\' 4"  (1.626 m), weight 95.7 kg, SpO2 100 %. Physical Exam  Well-nourished well-developed woman in no apparent distress.  Alert and oriented.  Normal mood and affect.  Gait is normal.  Left hallux is somewhat swollen.  Small puncture wound distally.  No signs of infection.  Pulses are palpable.  No lymphadenopathy.  Intact sensibility to light touch dorsally and plantarly at the forefoot.  Assessment/Plan Left hallux retained foreign bodies -to the operating room today for surgical removal.  The risks and benefits of the alternative treatment options have been discussed in detail.  The patient wishes to proceed with surgery and specifically understands risks of bleeding, infection, nerve damage, blood clots, need for additional surgery, amputation and death.   , MD Feb 11, 2020, 11:12  AM

## 2020-02-10 NOTE — Discharge Instructions (Addendum)
Toni Arthurs, MD [facility]  Please read the following information regarding your care after surgery.  Medications  You only need a prescription for the narcotic pain medicine (ex. oxycodone, Percocet, Norco).  All of the other medicines listed below are available over the counter. X Aleve 2 pills twice a day for the first 3 days after surgery. X resume your hydrocodone that you have at home. X oxycodone as prescribed for severe pain  Narcotic pain medicine (ex. oxycodone, Percocet, Vicodin) will cause constipation.  To prevent this problem, take the following medicines while you are taking any pain medicine. X docusate sodium (Colace) 100 mg twice a day X senna (Senokot) 2 tablets twice a day   Weight Bearing X Bear weight only on your operated foot in the post-op shoe.   Cast / Splint / Dressing X Keep your splint, cast or dressing clean and dry.  Don't put anything (coat hanger, pencil, etc) down inside of it.  If it gets damp, use a hair dryer on the cool setting to dry it.  If it gets soaked, call the office to schedule an appointment for a cast change.   After your dressing, cast or splint is removed; you may shower, but do not soak or scrub the wound.  Allow the water to run over it, and then gently pat it dry.  Swelling It is normal for you to have swelling where you had surgery.  To reduce swelling and pain, keep your toes above your nose for at least 3 days after surgery.  It may be necessary to keep your foot or leg elevated for several weeks.  If it hurts, it should be elevated.  Follow Up Call my office at [phone] when you are discharged from the hospital or surgery center to schedule an appointment to be seen two weeks after surgery.  Call my office at [phone] if you develop a fever >101.5 F, nausea, vomiting, bleeding from the surgical site or severe pain.     Post Anesthesia Home Care Instructions  Activity: Get plenty of rest for the remainder of the  day. A responsible individual must stay with you for 24 hours following the procedure.  For the next 24 hours, DO NOT: -Drive a car -Advertising copywriter -Drink alcoholic beverages -Take any medication unless instructed by your physician -Make any legal decisions or sign important papers.  Meals: Start with liquid foods such as gelatin or soup. Progress to regular foods as tolerated. Avoid greasy, spicy, heavy foods. If nausea and/or vomiting occur, drink only clear liquids until the nausea and/or vomiting subsides. Call your physician if vomiting continues.  Special Instructions/Symptoms: Your throat may feel dry or sore from the anesthesia or the breathing tube placed in your throat during surgery. If this causes discomfort, gargle with warm salt water. The discomfort should disappear within 24 hours.  If you had a scopolamine patch placed behind your ear for the management of post- operative nausea and/or vomiting:  1. The medication in the patch is effective for 72 hours, after which it should be removed.  Wrap patch in a tissue and discard in the trash. Wash hands thoroughly with soap and water. 2. You may remove the patch earlier than 72 hours if you experience unpleasant side effects which may include dry mouth, dizziness or visual disturbances. 3. Avoid touching the patch. Wash your hands with soap and water after contact with the patch.     Regional Anesthesia Blocks  1. Numbness or the inability to  move the "blocked" extremity may last from 3-48 hours after placement. The length of time depends on the medication injected and your individual response to the medication. If the numbness is not going away after 48 hours, call your surgeon.  2. The extremity that is blocked will need to be protected until the numbness is gone and the  Strength has returned. Because you cannot feel it, you will need to take extra care to avoid injury. Because it may be weak, you may have difficulty moving  it or using it. You may not know what position it is in without looking at it while the block is in effect.  3. For blocks in the legs and feet, returning to weight bearing and walking needs to be done carefully. You will need to wait until the numbness is entirely gone and the strength has returned. You should be able to move your leg and foot normally before you try and bear weight or walk. You will need someone to be with you when you first try to ensure you do not fall and possibly risk injury.  4. Bruising and tenderness at the needle site are common side effects and will resolve in a few days.  5. Persistent numbness or new problems with movement should be communicated to the surgeon or the [facility] [phone] [facility] [phone]).

## 2020-02-11 ENCOUNTER — Encounter (HOSPITAL_BASED_OUTPATIENT_CLINIC_OR_DEPARTMENT_OTHER): Payer: Self-pay | Admitting: Orthopedic Surgery

## 2020-02-17 ENCOUNTER — Telehealth: Payer: Self-pay | Admitting: *Deleted

## 2020-02-17 NOTE — Telephone Encounter (Signed)
Patient called and notified of potential exposure to employee that later tested positive for COVID-19. Patient verbalized understanding. Patient scheduled for testing on 02/18/20 at 9:05 am.

## 2020-02-18 ENCOUNTER — Other Ambulatory Visit (HOSPITAL_COMMUNITY)
Admission: RE | Admit: 2020-02-18 | Discharge: 2020-02-18 | Disposition: A | Discharge status: Home / Self Care | Payer: Medicare Other | Source: Ambulatory Visit | Attending: Internal Medicine | Admitting: Internal Medicine

## 2020-02-18 DIAGNOSIS — Z20822 Contact with and (suspected) exposure to covid-19: Secondary | ICD-10-CM | POA: Insufficient documentation

## 2020-02-18 DIAGNOSIS — Z01818 Encounter for other preprocedural examination: Secondary | ICD-10-CM | POA: Insufficient documentation

## 2020-02-18 LAB — SARS CORONAVIRUS 2 BY RT PCR (HOSPITAL ORDER, PERFORMED IN ~~LOC~~ HOSPITAL LAB): SARS Coronavirus 2: NEGATIVE

## 2020-04-13 ENCOUNTER — Ambulatory Visit (INDEPENDENT_AMBULATORY_CARE_PROVIDER_SITE_OTHER): Payer: Medicare Other

## 2020-04-13 DIAGNOSIS — I442 Atrioventricular block, complete: Secondary | ICD-10-CM | POA: Diagnosis not present

## 2020-04-13 LAB — CUP PACEART REMOTE DEVICE CHECK
Battery Remaining Longevity: 164 mo
Battery Voltage: 3.04 V
Brady Statistic AP VP Percent: 0.17 %
Brady Statistic AP VS Percent: 6.5 %
Brady Statistic AS VP Percent: 0.26 %
Brady Statistic AS VS Percent: 93.07 %
Brady Statistic RA Percent Paced: 6.63 %
Brady Statistic RV Percent Paced: 0.43 %
Date Time Interrogation Session: 20211028031629
Implantable Lead Implant Date: 20200129
Implantable Lead Implant Date: 20200129
Implantable Lead Location: 753859
Implantable Lead Location: 753860
Implantable Lead Model: 3830
Implantable Lead Model: 5076
Implantable Pulse Generator Implant Date: 20200129
Lead Channel Impedance Value: 323 Ohm
Lead Channel Impedance Value: 323 Ohm
Lead Channel Impedance Value: 475 Ohm
Lead Channel Impedance Value: 532 Ohm
Lead Channel Pacing Threshold Amplitude: 0.875 V
Lead Channel Pacing Threshold Amplitude: 1 V
Lead Channel Pacing Threshold Pulse Width: 0.4 ms
Lead Channel Pacing Threshold Pulse Width: 0.4 ms
Lead Channel Sensing Intrinsic Amplitude: 14.875 mV
Lead Channel Sensing Intrinsic Amplitude: 14.875 mV
Lead Channel Sensing Intrinsic Amplitude: 2 mV
Lead Channel Sensing Intrinsic Amplitude: 2 mV
Lead Channel Setting Pacing Amplitude: 2 V
Lead Channel Setting Pacing Amplitude: 2.25 V
Lead Channel Setting Pacing Pulse Width: 0.4 ms
Lead Channel Setting Sensing Sensitivity: 1.2 mV

## 2020-04-18 NOTE — Progress Notes (Signed)
Remote pacemaker transmission.   

## 2020-05-19 ENCOUNTER — Observation Stay (HOSPITAL_COMMUNITY): Payer: Medicare Other | Admitting: Anesthesiology

## 2020-05-19 ENCOUNTER — Encounter (HOSPITAL_COMMUNITY)
Admission: EM | Disposition: A | Discharge status: Home / Self Care | Payer: Self-pay | Source: Home / Self Care | Attending: Internal Medicine

## 2020-05-19 ENCOUNTER — Emergency Department (HOSPITAL_COMMUNITY): Payer: Medicare Other

## 2020-05-19 ENCOUNTER — Other Ambulatory Visit: Payer: Self-pay

## 2020-05-19 ENCOUNTER — Encounter (HOSPITAL_COMMUNITY): Payer: Self-pay

## 2020-05-19 ENCOUNTER — Inpatient Hospital Stay (HOSPITAL_COMMUNITY)
Admission: EM | Admit: 2020-05-19 | Discharge: 2020-05-23 | DRG: 378 | Disposition: A | Discharge status: Home / Self Care | Payer: Medicare Other | Attending: Internal Medicine | Admitting: Internal Medicine

## 2020-05-19 DIAGNOSIS — M1990 Unspecified osteoarthritis, unspecified site: Secondary | ICD-10-CM | POA: Diagnosis present

## 2020-05-19 DIAGNOSIS — K921 Melena: Secondary | ICD-10-CM | POA: Diagnosis not present

## 2020-05-19 DIAGNOSIS — Z950 Presence of cardiac pacemaker: Secondary | ICD-10-CM

## 2020-05-19 DIAGNOSIS — Z79899 Other long term (current) drug therapy: Secondary | ICD-10-CM

## 2020-05-19 DIAGNOSIS — K449 Diaphragmatic hernia without obstruction or gangrene: Secondary | ICD-10-CM | POA: Diagnosis present

## 2020-05-19 DIAGNOSIS — Z96652 Presence of left artificial knee joint: Secondary | ICD-10-CM | POA: Diagnosis present

## 2020-05-19 DIAGNOSIS — K648 Other hemorrhoids: Secondary | ICD-10-CM | POA: Diagnosis present

## 2020-05-19 DIAGNOSIS — Z947 Corneal transplant status: Secondary | ICD-10-CM

## 2020-05-19 DIAGNOSIS — Z90710 Acquired absence of both cervix and uterus: Secondary | ICD-10-CM

## 2020-05-19 DIAGNOSIS — Z91030 Bee allergy status: Secondary | ICD-10-CM

## 2020-05-19 DIAGNOSIS — K922 Gastrointestinal hemorrhage, unspecified: Secondary | ICD-10-CM | POA: Diagnosis not present

## 2020-05-19 DIAGNOSIS — Z885 Allergy status to narcotic agent status: Secondary | ICD-10-CM

## 2020-05-19 DIAGNOSIS — F17210 Nicotine dependence, cigarettes, uncomplicated: Secondary | ICD-10-CM | POA: Diagnosis present

## 2020-05-19 DIAGNOSIS — I10 Essential (primary) hypertension: Secondary | ICD-10-CM | POA: Diagnosis present

## 2020-05-19 DIAGNOSIS — E876 Hypokalemia: Secondary | ICD-10-CM | POA: Diagnosis present

## 2020-05-19 DIAGNOSIS — K254 Chronic or unspecified gastric ulcer with hemorrhage: Principal | ICD-10-CM | POA: Diagnosis present

## 2020-05-19 DIAGNOSIS — E785 Hyperlipidemia, unspecified: Secondary | ICD-10-CM | POA: Diagnosis present

## 2020-05-19 DIAGNOSIS — K219 Gastro-esophageal reflux disease without esophagitis: Secondary | ICD-10-CM | POA: Diagnosis present

## 2020-05-19 DIAGNOSIS — Z20822 Contact with and (suspected) exposure to covid-19: Secondary | ICD-10-CM | POA: Diagnosis present

## 2020-05-19 DIAGNOSIS — R112 Nausea with vomiting, unspecified: Secondary | ICD-10-CM

## 2020-05-19 DIAGNOSIS — Z7982 Long term (current) use of aspirin: Secondary | ICD-10-CM

## 2020-05-19 DIAGNOSIS — K5790 Diverticulosis of intestine, part unspecified, without perforation or abscess without bleeding: Secondary | ICD-10-CM | POA: Diagnosis present

## 2020-05-19 DIAGNOSIS — Z87442 Personal history of urinary calculi: Secondary | ICD-10-CM

## 2020-05-19 DIAGNOSIS — D62 Acute posthemorrhagic anemia: Secondary | ICD-10-CM | POA: Diagnosis present

## 2020-05-19 DIAGNOSIS — Z8673 Personal history of transient ischemic attack (TIA), and cerebral infarction without residual deficits: Secondary | ICD-10-CM

## 2020-05-19 DIAGNOSIS — Z8249 Family history of ischemic heart disease and other diseases of the circulatory system: Secondary | ICD-10-CM

## 2020-05-19 HISTORY — PX: ESOPHAGOGASTRODUODENOSCOPY: SHX5428

## 2020-05-19 LAB — RESP PANEL BY RT-PCR (FLU A&B, COVID) ARPGX2
Influenza A by PCR: NEGATIVE
Influenza B by PCR: NEGATIVE
SARS Coronavirus 2 by RT PCR: NEGATIVE

## 2020-05-19 LAB — URINALYSIS, ROUTINE W REFLEX MICROSCOPIC
Bacteria, UA: NONE SEEN
Bilirubin Urine: NEGATIVE
Glucose, UA: NEGATIVE mg/dL
Ketones, ur: NEGATIVE mg/dL
Leukocytes,Ua: NEGATIVE
Nitrite: NEGATIVE
Protein, ur: NEGATIVE mg/dL
Specific Gravity, Urine: 1.018 (ref 1.005–1.030)
pH: 5 (ref 5.0–8.0)

## 2020-05-19 LAB — COMPREHENSIVE METABOLIC PANEL
ALT: 23 U/L (ref 0–44)
AST: 25 U/L (ref 15–41)
Albumin: 3.9 g/dL (ref 3.5–5.0)
Alkaline Phosphatase: 80 U/L (ref 38–126)
Anion gap: 12 (ref 5–15)
BUN: 42 mg/dL — ABNORMAL HIGH (ref 8–23)
CO2: 25 mmol/L (ref 22–32)
Calcium: 9.5 mg/dL (ref 8.9–10.3)
Chloride: 103 mmol/L (ref 98–111)
Creatinine, Ser: 0.94 mg/dL (ref 0.44–1.00)
GFR, Estimated: >60 mL/min (ref >60)
Glucose, Bld: 151 mg/dL — ABNORMAL HIGH (ref 70–99)
Potassium: 3.5 mmol/L (ref 3.5–5.1)
Sodium: 140 mmol/L (ref 135–145)
Total Bilirubin: 0.9 mg/dL (ref 0.3–1.2)
Total Protein: 6.5 g/dL (ref 6.5–8.1)

## 2020-05-19 LAB — I-STAT CHEM 8, ED
BUN: 41 mg/dL — ABNORMAL HIGH (ref 8–23)
Calcium, Ion: 1.26 mmol/L (ref 1.15–1.40)
Chloride: 102 mmol/L (ref 98–111)
Creatinine, Ser: 0.9 mg/dL (ref 0.44–1.00)
Glucose, Bld: 141 mg/dL — ABNORMAL HIGH (ref 70–99)
HCT: 29 % — ABNORMAL LOW (ref 36.0–46.0)
Hemoglobin: 9.9 g/dL — ABNORMAL LOW (ref 12.0–15.0)
Potassium: 3.7 mmol/L (ref 3.5–5.1)
Sodium: 140 mmol/L (ref 135–145)
TCO2: 27 mmol/L (ref 22–32)

## 2020-05-19 LAB — CBC
HCT: 32.6 % — ABNORMAL LOW (ref 36.0–46.0)
Hemoglobin: 10.5 g/dL — ABNORMAL LOW (ref 12.0–15.0)
MCH: 29.2 pg (ref 26.0–34.0)
MCHC: 32.2 g/dL (ref 30.0–36.0)
MCV: 90.8 fL (ref 80.0–100.0)
Platelets: 258 10*3/uL (ref 150–400)
RBC: 3.59 MIL/uL — ABNORMAL LOW (ref 3.87–5.11)
RDW: 15.3 % (ref 11.5–15.5)
WBC: 10.5 10*3/uL (ref 4.0–10.5)
nRBC: 0 % (ref 0.0–0.2)

## 2020-05-19 LAB — POC OCCULT BLOOD, ED: Fecal Occult Bld: POSITIVE — AB

## 2020-05-19 SURGERY — EGD (ESOPHAGOGASTRODUODENOSCOPY)
Anesthesia: Monitor Anesthesia Care

## 2020-05-19 MED ORDER — PROPOFOL 10 MG/ML IV BOLUS
INTRAVENOUS | Status: DC | PRN
  Administered 2020-05-19 (×3): 20 mg via INTRAVENOUS

## 2020-05-19 MED ORDER — SODIUM CHLORIDE 0.9 % IV SOLN: INTRAVENOUS | Status: DC

## 2020-05-19 MED ORDER — PREDNISOLONE ACETATE 1 % OP SUSP
1.0000 [drp] | Freq: Every day | OPHTHALMIC | Status: DC
  Administered 2020-05-20 – 2020-05-23 (×4): 1 [drp] via OPHTHALMIC
  Filled 2020-05-19: qty 5

## 2020-05-19 MED ORDER — ATORVASTATIN CALCIUM 40 MG PO TABS
40.0000 mg | ORAL_TABLET | Freq: Every evening | ORAL | Status: DC
  Administered 2020-05-19 – 2020-05-20 (×2): 40 mg via ORAL
  Filled 2020-05-19 (×2): qty 1

## 2020-05-19 MED ORDER — PHENYLEPHRINE 40 MCG/ML (10ML) SYRINGE FOR IV PUSH (FOR BLOOD PRESSURE SUPPORT)
PREFILLED_SYRINGE | INTRAVENOUS | Status: DC | PRN
  Administered 2020-05-19 (×5): 120 ug via INTRAVENOUS

## 2020-05-19 MED ORDER — PROPOFOL 500 MG/50ML IV EMUL
INTRAVENOUS | Status: DC | PRN
  Administered 2020-05-19: 100 ug/kg/min via INTRAVENOUS

## 2020-05-19 MED ORDER — FERROUS SULFATE 325 (65 FE) MG PO TABS
325.0000 mg | ORAL_TABLET | Freq: Every day | ORAL | Status: DC
  Administered 2020-05-20 – 2020-05-23 (×4): 325 mg via ORAL
  Filled 2020-05-19 (×4): qty 1

## 2020-05-19 MED ORDER — TIMOLOL MALEATE 0.5 % OP SOLN
1.0000 [drp] | Freq: Every morning | OPHTHALMIC | Status: DC
  Administered 2020-05-20 – 2020-05-23 (×4): 1 [drp] via OPHTHALMIC
  Filled 2020-05-19: qty 5

## 2020-05-19 MED ORDER — SODIUM CHLORIDE 0.9 % IV BOLUS
1000.0000 mL | Freq: Once | INTRAVENOUS | Status: AC
  Administered 2020-05-19: 1000 mL via INTRAVENOUS

## 2020-05-19 MED ORDER — PROPOFOL 10 MG/ML IV BOLUS
INTRAVENOUS | Status: AC
  Filled 2020-05-19: qty 20

## 2020-05-19 MED ORDER — ONDANSETRON HCL 4 MG/2ML IJ SOLN
4.0000 mg | Freq: Once | INTRAMUSCULAR | Status: AC
  Administered 2020-05-19: 4 mg via INTRAVENOUS
  Filled 2020-05-19: qty 2

## 2020-05-19 MED ORDER — LIDOCAINE 2% (20 MG/ML) 5 ML SYRINGE
INTRAMUSCULAR | Status: DC | PRN
  Administered 2020-05-19: 100 mg via INTRAVENOUS

## 2020-05-19 MED ORDER — ONDANSETRON HCL 4 MG PO TABS: 4.0000 mg | ORAL_TABLET | Freq: Four times a day (QID) | ORAL | Status: DC | PRN

## 2020-05-19 MED ORDER — SODIUM CHLORIDE 0.9 % IV SOLN
8.0000 mg/h | INTRAVENOUS | Status: AC
  Administered 2020-05-19 – 2020-05-22 (×6): 8 mg/h via INTRAVENOUS
  Filled 2020-05-19 (×9): qty 80

## 2020-05-19 MED ORDER — ADULT MULTIVITAMIN W/MINERALS CH
1.0000 | ORAL_TABLET | Freq: Every day | ORAL | Status: DC
  Administered 2020-05-20 – 2020-05-23 (×4): 1 via ORAL
  Filled 2020-05-19 (×4): qty 1

## 2020-05-19 MED ORDER — LACTATED RINGERS IV SOLN: INTRAVENOUS | Status: DC | PRN

## 2020-05-19 MED ORDER — PANTOPRAZOLE SODIUM 40 MG IV SOLR
40.0000 mg | Freq: Once | INTRAVENOUS | Status: AC
  Administered 2020-05-19: 40 mg via INTRAVENOUS
  Filled 2020-05-19: qty 40

## 2020-05-19 MED ORDER — ONDANSETRON HCL 4 MG/2ML IJ SOLN: 4.0000 mg | Freq: Four times a day (QID) | INTRAMUSCULAR | Status: DC | PRN

## 2020-05-19 NOTE — ED Notes (Signed)
Patient off the floor for scan  

## 2020-05-19 NOTE — ED Notes (Signed)
External Purewick attached to patient

## 2020-05-19 NOTE — Consult Note (Signed)
Referring Provider: ED Primary Care Physician:  Caffie DammeSmith, Karla, MD Primary Gastroenterologist:  [facility]  Reason for Consultation:  Hematemesis, melena  HPI: Kelsey [name] is a 74 y.o. female with history of TIA (2019) pacemaker presenting with hematemesis and melena.  Patient states that earlier this morning, she started having loose, melenic stools.  She also reports 3 episodes of emesis.  Initially, the emesis contained dark blood; however, the last episodes of emesis contained bright red blood.  She denies any abdominal pain.  Reports occasional GERD based on food consumption but denies any dysphagia.  Patient reports weight loss of 30 pounds over the, which was intentional.  Denies changes in appetite.  Patient denies any family history of colon cancer or gastrointestinal malignancy.  States [facility]  She takes 81 mg aspirin daily.  She takes Aleve once to twice per week.  Denies blood thinner use.  Past Medical History:  Diagnosis Date  . Arthritis    OA  . Heart murmur    refer to cardiologist note from dr Dietrich Patespaula ross  . Hyperlipidemia   . Hypertension   . Left ureteral calculus   . 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 Wears dentures    upper  and partial lower  . Wears glasses     Past Surgical History:  Procedure Laterality Date  . ABDOMINAL HYSTERECTOMY    . CARDIOVASCULAR STRESS TEST  03-31-2012   normal perfusion study/  no ischemia/  ef 69%  . COLONOSCOPY WITH ESOPHAGOGASTRODUODENOSCOPY (EGD)  09-21-2003  . CORNEAL TRANSPLANT Left 2002  . CYSTOSCOPY WITH URETEROSCOPY AND STENT PLACEMENT Left 01/07/2014   Procedure: CYSTOSCOPY WITH LEFT RETRGRADE, URETEROSCOPY , AND LASER LITHOTRIPSY STONE EXTRACTION AND LEFT STENT PLACEMENT;  Surgeon: Crist FatBenjamin W Herrick, MD;  [facility];  Service: Urology;  Laterality: Left;  . FOREIGN BODY REMOVAL Left  02/10/2020   Procedure: Removal of retained foreign body of left great toe;  Surgeon: Toni ArthursHewitt, John, MD;  Location: [facility];  Service: Orthopedics;  Laterality: Left;  30min  . HOLMIUM LASER APPLICATION Left 01/07/2014   Procedure: HOLMIUM LASER APPLICATION;  Surgeon: Crist FatBenjamin W Herrick, MD;  [facility];  Service: Urology;  Laterality: Left;  . PACEMAKER IMPLANT N/A 07/15/2018   Procedure: PACEMAKER IMPLANT;  Surgeon: Marinus Mawaylor, Gregg W, MD;  [facility];  Service: Cardiovascular;  Laterality: N/A;  . REVISION TOTAL KNEE ARTHROPLASTY  left 08-01-2003/  right 02-20-02006  . TOTAL KNEE ARTHROPLASTY  left 03-30-2003/  right 11-05-2004   post op left knee I & D arthroscopic lavage 04-20-2003  . TRANSTHORACIC ECHOCARDIOGRAM  08-28-2011  dr Gunnar Fusipaula ross   moderate lvh/  ef 60-65%/  grade 2 diastolic dysfunction/ normal lv function / hyperdynamic lv with outflow murmur / turbulance through LVOT , mild SAM/ moderate lae/ moderate tr/  trivial mv & pv    Prior to Admission medications   Medication Sig Start Date End Date Taking? Authorizing Provider  amLODipine (NORVASC) 10 MG tablet Take 10 mg by mouth daily. 08/11/19  Yes [provider]  aspirin EC 81 MG tablet Take 81 mg by mouth daily.   Yes [provider]  atorvastatin (LIPITOR) 40 MG tablet Take 40 mg by mouth every evening. 04/13/13  Yes Pricilla Riffleoss, Paula V, MD  Calcium Carb-Cholecalciferol (CALCIUM + D3 PO) Take 1 capsule by mouth daily.  Yes [provider]  chlorthalidone (HYGROTON) 25 MG tablet TAKE 1 TABLET BY MOUTH EVERY DAY Patient taking differently: Take 25 mg by mouth daily.  12/28/19  Yes Marinus Maw, MD  EPINEPHrine (EPIPEN 2-PAK) 0.3 mg/0.3 mL IJ SOAJ injection Inject 0.3 mg into the muscle as needed for anaphylaxis.   Yes [provider]  ferrous sulfate 325 (65 FE) MG EC tablet Take 1 tablet by mouth daily. 05/06/19  Yes [provider]   HYDROcodone-acetaminophen (NORCO) 10-325 MG tablet Take 1 tablet by mouth 2 (two) times daily as needed for severe pain.    Yes [provider]  Multiple Vitamins-Minerals (MULTIVITAMIN WITH MINERALS) tablet Take 1 tablet by mouth daily.   Yes [provider]  naproxen sodium (ALEVE) 220 MG tablet Take 220 mg by mouth daily as needed (pain).   Yes [provider]  potassium chloride SA (KLOR-CON M20) 20 MEQ tablet Take 1 tablet (20 mEq total) by mouth 2 (two) times daily. 07/13/18 05/19/20 Yes Marinus Maw, MD  prednisoLONE acetate (PRED FORTE) 1 % ophthalmic suspension Place 1 drop into the left eye daily. 02/04/20  Yes [provider]  timolol (TIMOPTIC) 0.5 % ophthalmic solution Place 1 drop into the left eye every morning. 01/24/20  Yes [provider]  docusate sodium (COLACE) 100 MG capsule Take 1 capsule (100 mg total) by mouth 2 (two) times daily. While taking narcotic pain medicine. Patient not taking: Reported on 05/19/2020 02/10/20   Jacinta Shoe, PA-C  senna (SENOKOT) 8.6 MG TABS tablet Take 2 tablets (17.2 mg total) by mouth 2 (two) times daily. Patient not taking: Reported on 05/19/2020 02/10/20   Jacinta Shoe, PA-C    Scheduled Meds: Continuous Infusions: PRN Meds:.  Allergies as of 05/19/2020 - Review Complete 05/19/2020  Allergen Reaction Noted  . Bee venom Anaphylaxis 11/18/2011  . Oxycodone-acetaminophen Itching 12/08/2009    Family History  Problem Relation Age of Onset  . Hypertension Father   . Hypertension Brother     Social History   Socioeconomic History  . Marital status: Widowed    Spouse name: Not on file  . Number of children: Not on file  . Years of education: Not on file  . Highest education level: Not on file  Occupational History  . Not on file  Tobacco Use  . Smoking status: Current Some Day Smoker    Packs/day: 0.50    Types: Cigarettes  . Smokeless tobacco: Never Used  . Tobacco  comment: per pt occasional smoker  Vaping Use  . Vaping Use: Never used  Substance and Sexual Activity  . Alcohol use: Yes    Alcohol/week: 3.0 standard drinks    Types: 3 Glasses of wine per week    Comment: social  . Drug use: No  . Sexual activity: Not on file  Other Topics Concern  . 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 Widowed - husband died Oct 22, 2011. Occasional smoking and Etoh.   Social Determinants of Health   Financial Resource Strain:   . Difficulty of Paying Living Expenses: Not on file  Food Insecurity:   . Worried About Programme researcher, broadcasting/film/video in the Last Year: Not on file  . Ran Out of Food in the Last Year: Not on file  Transportation Needs:   . Lack of Transportation (Medical): Not on file  . Lack of Transportation (Non-Medical): Not on file  Physical  Activity:   . Days of Exercise per Week: Not on file  . Minutes of Exercise per Session: Not on file  Stress:   . Feeling of Stress : Not on file  Social Connections:   . Frequency of Communication with Friends and Family: Not on file  . Frequency of Social Gatherings with Friends and Family: Not on file  . Attends Religious Services: Not on file  . Active Member of Clubs or Organizations: Not on file  . Attends Banker Meetings: Not on file  . Marital Status: Not on file  Intimate Partner Violence:   . Fear of Current or Ex-Partner: Not on file  . Emotionally Abused: Not on file  . Physically Abused: Not on file  . Sexually Abused: Not on file    Review of Systems: Review of Systems  Constitutional: Negative for chills and fever.  HENT: Negative for hearing loss and tinnitus.   Eyes: Negative for pain and redness.  Respiratory: Negative for cough and shortness of breath.   Cardiovascular: Negative for chest pain and palpitations.  Gastrointestinal: Positive for diarrhea, heartburn, melena, nausea and vomiting. Negative for abdominal  pain, blood in stool and constipation.  Genitourinary: Negative for flank pain and hematuria.  Musculoskeletal: Negative for falls and joint pain.  Skin: Negative for itching and rash.  Neurological: Negative for seizures and loss of consciousness.  Endo/Heme/Allergies: Negative for polydipsia. Does not bruise/bleed easily.  Psychiatric/Behavioral: Negative for substance abuse. The patient is not nervous/anxious.     Physical Exam: Vital signs: Vitals:   05/19/20 1130 05/19/20 1212  BP: 108/69 112/66  Pulse: 84 81  Resp: (!) 21 18  Temp: 98 F (36.7 C) 98 F (36.7 C)  SpO2: 95% 100%     Physical Exam Vitals reviewed.  Constitutional:      General: She is not in acute distress.    Appearance: She is overweight.  HENT:     Head: Normocephalic and atraumatic.     Nose: Nose normal. No congestion.     Mouth/Throat:     Mouth: Mucous membranes are moist.     Pharynx: Oropharynx is clear.  Eyes:     General: No scleral icterus.    Extraocular Movements: Extraocular movements intact.     Conjunctiva/sclera: Conjunctivae normal.  Cardiovascular:     Rate and Rhythm: Normal rate and regular rhythm.     Pulses: Normal pulses.     Heart sounds: Murmur heard.   Pulmonary:     Effort: Pulmonary effort is normal.     Breath sounds: Normal breath sounds.  Abdominal:     General: Bowel sounds are normal. There is no distension.     Palpations: Abdomen is soft. There is no mass.     Tenderness: There is no abdominal tenderness. There is no guarding or rebound.     Hernia: No hernia is present.  Musculoskeletal:        General: No swelling or tenderness.     Cervical back: Normal range of motion and neck supple.  Skin:    General: Skin is warm and dry.  Neurological:     General: No focal deficit present.     Mental Status: She is oriented to person, place, and time. She is lethargic.  Psychiatric:        Mood and Affect: Mood normal.        Behavior: Behavior normal.  Behavior is cooperative.     GI:  Lab Results: Recent Labs  05/19/20 1028 05/19/20 1054  WBC 10.5  --   HGB 10.5* 9.9*  HCT 32.6* 29.0*  PLT 258  --    BMET Recent Labs    05/19/20 1028 05/19/20 1054  NA 140 140  K 3.5 3.7  CL 103 102  CO2 25  --   GLUCOSE 151* 141*  BUN 42* 41*  CREATININE 0.94 0.90  CALCIUM 9.5  --    LFT Recent Labs    05/19/20 1028  PROT 6.5  ALBUMIN 3.9  AST 25  ALT 23  ALKPHOS 80  BILITOT 0.9   PT/INR No results for input(s): LABPROT, INR in the last 72 hours.   Studies/Results: DG Abdomen Acute W/Chest  Result Date: 05/19/2020 CLINICAL DATA:  Epigastric pain.  Vomiting. EXAM: DG ABDOMEN ACUTE WITH 1 VIEW CHEST COMPARISON:  12/20/2013 CT abdomen pelvis. FINDINGS: No focal consolidation, pneumothorax or pleural effusion. Cardiomediastinal silhouette within normal limits. Aortic atherosclerotic calcifications. Left chest pacing device with leads terminating over the right heart. Multilevel spondylosis. Nonobstructive bowel gas pattern. Mild colorectal stool burden. Left hemipelvic dystrophic calcifications, unchanged. No suspicious calcific densities. No acute osseous abnormality. IMPRESSION: 1. No acute cardiopulmonary disease. 2. Nonobstructive bowel gas pattern. Mild colorectal stool burden. Electronically Signed   By: Stana Bunting M.D.   On: 05/19/2020 11:34    Impression: Hematemesis, melena -Hemoglobin 10.5, decreased from 10 baseline of 11.9 as of 06/2018 (no more recent CBC on file) -BUN elevated to 42, out of proportion of creatinine, consistent with upper GI bleeding.  Baseline BUN of 19 as of 02/09/2020 -81 mg aspirin daily with weekly Aleve use.  No blood thinner use. -Patient currently hemodynamically stable with regular heart rate.  Plan: EGD today.  I thoroughly discussed the procedure with the patient to include nature, alternatives, benefits, and risks (including but not limited to bleeding, infection, perforation,  anesthesia/cardiac and pulmonary complications).  Keep patient NPO for now.  IV Protonix drip.  [facility] will follow.   LOS: 0 days   Edrick Kins  PA-C 05/19/2020, 12:38 PM  Contact #  [phone]

## 2020-05-19 NOTE — H&P (Signed)
History and Physical    Kelsey [name] WSF:[id number] DOB: [birth year] DOA: 05/19/2020  PCP: Caffie Damme, MD  Patient coming from: Home  Chief Complaint: GIB  HPI: Kelsey [name] is a 74 y.o. female with medical history significant of HTN, HLD. Presenting with hematemesis and hematochezia. She reports that she was in her normal state of health until this morning around 3:30. She went to the bathroom and began to feel a knot in her throat. She vomited blood. So noticed blood in her stool after she vomited. She denies any previous episodes. She reports feeling weak and dizzy, so she went back to bed to lay down. She called her son for help. He called EMS.    ED Course: Her hgb was 10. She was FOBT positive. GI was consulted. [facility] was called for admission.   Review of Systems:  Denies CP, dyspnea, palpitations, HA, syncopal episodes. Review of systems is otherwise negative for all not mentioned in HPI.   PMHx Past Medical History:  Diagnosis Date  . Arthritis    OA  . Heart murmur    refer to cardiologist note from dr Dietrich Pates  . Hyperlipidemia   . Hypertension   . Left ureteral calculus   . 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 Wears dentures    upper  and partial lower  . Wears glasses     PSHx Past Surgical History:  Procedure Laterality Date  . ABDOMINAL HYSTERECTOMY    . CARDIOVASCULAR STRESS TEST  03-31-2012   normal perfusion study/  no ischemia/  ef 69%  . COLONOSCOPY WITH ESOPHAGOGASTRODUODENOSCOPY (EGD)  09-21-2003  . CORNEAL TRANSPLANT Left 2002  . CYSTOSCOPY WITH URETEROSCOPY AND STENT PLACEMENT Left 01/07/2014   Procedure: CYSTOSCOPY WITH LEFT RETRGRADE, URETEROSCOPY , AND LASER LITHOTRIPSY STONE EXTRACTION AND LEFT STENT PLACEMENT;  Surgeon: Crist Fat, MD;  Location: [facility];  Service: Urology;  Laterality: Left;  . FOREIGN BODY REMOVAL Left 02/10/2020   Procedure: Removal of retained foreign body of left  great toe;  Surgeon: Toni Arthurs, MD;  Location: [facility];  Service: Orthopedics;  Laterality: Left;   . HOLMIUM LASER APPLICATION Left 01/07/2014   Procedure: HOLMIUM LASER APPLICATION;  Surgeon: Crist Fat, MD;  Location: [facility];  Service: Urology;  Laterality: Left;  . PACEMAKER IMPLANT N/A 07/15/2018   Procedure: PACEMAKER IMPLANT;  Surgeon: Marinus Maw, MD;  Location: [facility];  Service: Cardiovascular;  Laterality: N/A;  . REVISION TOTAL KNEE ARTHROPLASTY  left 08-01-2003/  right 02-20-02006  . TOTAL KNEE ARTHROPLASTY  left 03-30-2003/  right 11-05-2004   post op left knee I & D arthroscopic lavage 04-20-2003  . TRANSTHORACIC ECHOCARDIOGRAM  08-28-2011  dr Gunnar Fusi ross   moderate lvh/  ef 60-65%/  grade 2 diastolic dysfunction/ normal lv function / hyperdynamic lv with outflow murmur / turbulance through LVOT , mild SAM/ moderate lae/ moderate tr/  trivial mv & pv    SocHx  reports that she has been smoking cigarettes. She has been smoking about 0.50 packs per day. She has never used smokeless tobacco. She reports current alcohol use of about 3.0 standard drinks of alcohol per week. She reports that she does not use drugs.  Allergies  Allergen Reactions  . Bee Venom Anaphylaxis    ALL BEE STINGS  . Oxycodone-Acetaminophen Itching    FamHx Family History  Problem Relation Age of Onset  . Hypertension  Father   . Hypertension Brother     Prior to Admission medications   Medication Sig Start Date End Date Taking? Authorizing Provider  amLODipine (NORVASC) 10 MG tablet Take 10 mg by mouth daily. 08/11/19  Yes [provider]  aspirin EC 81 MG tablet Take 81 mg by mouth daily.   Yes [provider]  atorvastatin (LIPITOR) 40 MG tablet Take 40 mg by mouth every evening. 04/13/13  Yes Pricilla Riffle, MD  Calcium Carb-Cholecalciferol (CALCIUM + D3 PO) Take 1 capsule by mouth daily.   Yes [provider]  chlorthalidone (HYGROTON) 25 MG tablet TAKE 1 TABLET BY MOUTH EVERY DAY Patient taking differently: Take 25 mg by mouth daily.  12/28/19  Yes Marinus Maw, MD  EPINEPHrine (EPIPEN 2-PAK) 0.3 mg/0.3 mL IJ SOAJ injection Inject 0.3 mg into the muscle as needed for anaphylaxis.   Yes [provider]  ferrous sulfate 325 (65 FE) MG EC tablet Take 1 tablet by mouth daily. 05/06/19  Yes [provider]  HYDROcodone-acetaminophen (NORCO) 10-325 MG tablet Take 1 tablet by mouth 2 (two) times daily as needed for severe pain.    Yes [provider]  Multiple Vitamins-Minerals (MULTIVITAMIN WITH MINERALS) tablet Take 1 tablet by mouth daily.   Yes [provider]  naproxen sodium (ALEVE) 220 MG tablet Take 220 mg by mouth daily as needed (pain).   Yes [provider]  potassium chloride SA (KLOR-CON M20) 20 MEQ tablet Take 1 tablet (20 mEq total) by mouth 2 (two) times daily. 07/13/18 05/19/20 Yes Marinus Maw, MD  prednisoLONE acetate (PRED FORTE) 1 % ophthalmic suspension Place 1 drop into the left eye daily. 02/04/20  Yes [provider]  timolol (TIMOPTIC) 0.5 % ophthalmic solution Place 1 drop into the left eye every morning. 01/24/20  Yes [provider]  docusate sodium (COLACE) 100 MG capsule Take 1 capsule (100 mg total) by mouth 2 (two) times daily. While taking narcotic pain medicine. Patient not taking: Reported on 05/19/2020 02/10/20   Jacinta Shoe, PA-C  senna (SENOKOT) 8.6 MG TABS tablet Take 2 tablets (17.2 mg total) by mouth 2 (two) times daily. Patient not taking: Reported on 05/19/2020 02/10/20   Jacinta Shoe, [place]    Physical Exam: Vitals:   05/19/20 1045 05/19/20 1103 05/19/20 1130 05/19/20 1212  BP: (!) 68/54 104/68 108/69 112/66  Pulse:   84 81  Resp: (!) 34  (!) 21 18  Temp:   98 F (36.7 C) 98 F (36.7 C)  TempSrc:    Oral  SpO2:   95% 100%  Weight:      Height:        General: 74 y.o. female  resting in bed in NAD Eyes: PERRL, normal sclera ENMT: Nares patent w/o discharge, orophaynx clear, dentition normal, ears w/o discharge/lesions/ulcers Neck: Supple, trachea midline Cardiovascular: RRR, +S1, S2, no g/r, 3/6 SEM, equal pulses throughout Respiratory: CTABL, no w/r/r, normal WOB GI: BS+, NDNT, no masses noted, no organomegaly noted MSK: No e/c/c Skin: No rashes, bruises, ulcerations noted Neuro: A&O x 3, no focal deficits Psyc: Appropriate interaction and affect, calm/cooperative  Labs on Admission: I have personally reviewed following labs and imaging studies  CBC: Recent Labs  Lab 05/19/20 1028 05/19/20 1054  WBC 10.5  --   HGB 10.5* 9.9*  HCT 32.6* 29.0*  MCV 90.8  --   PLT 258  --    Basic Metabolic Panel: Recent Labs  Lab  05/19/20 1028 05/19/20 1054  NA 140 140  K 3.5 3.7  CL 103 102  CO2 25  --   GLUCOSE 151* 141*  BUN 42* 41*  CREATININE 0.94 0.90  CALCIUM 9.5  --    GFR: Estimated Creatinine Clearance: 60.5 mL/min (by C-G formula based on SCr of 0.9 mg/dL). Liver Function Tests: Recent Labs  Lab 05/19/20 1028  AST 25  ALT 23  ALKPHOS 80  BILITOT 0.9  PROT 6.5  ALBUMIN 3.9   No results for input(s): LIPASE, AMYLASE in the last 168 hours. No results for input(s): AMMONIA in the last 168 hours. Coagulation Profile: No results for input(s): INR, PROTIME in the last 168 hours. Cardiac Enzymes: No results for input(s): CKTOTAL, CKMB, CKMBINDEX, TROPONINI in the last 168 hours. BNP (last 3 results) No results for input(s): PROBNP in the last 8760 hours. HbA1C: No results for input(s): HGBA1C in the last 72 hours. CBG: No results for input(s): GLUCAP in the last 168 hours. Lipid Profile: No results for input(s): CHOL, HDL, LDLCALC, TRIG, CHOLHDL, LDLDIRECT in the last 72 hours. Thyroid Function Tests: No results for input(s): TSH, T4TOTAL, FREET4, T3FREE, THYROIDAB in the last 72 hours. Anemia Panel: No results for input(s):  VITAMINB12, FOLATE, FERRITIN, TIBC, IRON, RETICCTPCT in the last 72 hours. Urine analysis:    Component Value Date/Time   COLORURINE YELLOW 12/21/2013 0600   APPEARANCEUR CLEAR 12/21/2013 0600   LABSPEC 1.012 12/21/2013 0600   PHURINE 6.5 12/21/2013 0600   GLUCOSEU 100 (A) 12/21/2013 0600   HGBUR LARGE (A) 12/21/2013 0600   BILIRUBINUR NEGATIVE 12/21/2013 0600   KETONESUR NEGATIVE 12/21/2013 0600   PROTEINUR NEGATIVE 12/21/2013 0600   UROBILINOGEN 0.2 12/21/2013 0600   NITRITE NEGATIVE 12/21/2013 0600   LEUKOCYTESUR SMALL (A) 12/21/2013 0600    Radiological Exams on Admission: DG Abdomen Acute W/Chest  Result Date: 05/19/2020 CLINICAL DATA:  Epigastric pain.  Vomiting. EXAM: DG ABDOMEN ACUTE WITH 1 VIEW CHEST COMPARISON:  12/20/2013 CT abdomen pelvis. FINDINGS: No focal consolidation, pneumothorax or pleural effusion. Cardiomediastinal silhouette within normal limits. Aortic atherosclerotic calcifications. Left chest pacing device with leads terminating over the right heart. Multilevel spondylosis. Nonobstructive bowel gas pattern. Mild colorectal stool burden. Left hemipelvic dystrophic calcifications, unchanged. No suspicious calcific densities. No acute osseous abnormality. IMPRESSION: 1. No acute cardiopulmonary disease. 2. Nonobstructive bowel gas pattern. Mild colorectal stool burden. Electronically Signed   By: Stana Buntinghikanele  [place] M.D.   On: 05/19/2020 11:34    EKG: Independently reviewed. Sinus tach, no st elevation  Assessment/Plan GIB     - admit to obs, telemetry     - [facility] consulted, EGD : non-bleeding ulcer noted     - protonix, CLD, avoid NSAIDs     - q6h H&H, transfuse as necessary  HTN     - she is hypotensive; hold home meds  HLD     - continue statin  DVT prophylaxis: SCD  Code Status: FULL  Family Communication: None at bedside.  Consults called: [facility] ([place])  Status is: Observation  The patient remains OBS appropriate and will d/c before 2  midnights.  Dispo: The patient is from: Home              Anticipated d/c is to: Home              Anticipated d/c date is: 1 day              Patient currently is not medically stable to d/c.  Teddy Spike DO [facility]  If 7PM-7AM, please contact night-coverage [email or web address]  05/19/2020, 12:57 PM

## 2020-05-19 NOTE — Anesthesia Preprocedure Evaluation (Addendum)
Anesthesia Evaluation  Patient identified by MRN, date of birth, ID band Patient awake    Reviewed: Patient's Chart, lab work & pertinent test results  Airway Mallampati: II  TM Distance: >3 FB Neck ROM: Full    Dental  (+) Upper Dentures, Lower Dentures   Pulmonary Current Smoker and Patient abstained from smoking.,    Pulmonary exam normal        Cardiovascular hypertension, Pt. on medications and Pt. on home beta blockers + pacemaker  Rhythm:Regular Rate:Normal     Neuro/Psych TIA   GI/Hepatic Neg liver ROS, Melena   Endo/Other  negative endocrine ROS  Renal/GU negative Renal ROS  negative genitourinary   Musculoskeletal  (+) Arthritis ,   Abdominal (+)  Abdomen: soft. Bowel sounds: normal.  Peds  Hematology negative hematology ROS (+)   Anesthesia Other Findings   Reproductive/Obstetrics                             Anesthesia Physical Anesthesia Plan  ASA: III  Anesthesia Plan: MAC   Post-op Pain Management:    Induction:   PONV Risk Score and Plan: 1 and Propofol infusion and Treatment may vary due to age or medical condition  Airway Management Planned: Simple Face Mask, Natural Airway and Nasal Cannula  Additional Equipment: None  Intra-op Plan:   Post-operative Plan:   Informed Consent: I have reviewed the patients History and Physical, chart, labs and discussed the procedure including the risks, benefits and alternatives for the proposed anesthesia with the patient or authorized representative who has indicated his/her understanding and acceptance.     Dental advisory given  Plan Discussed with: CRNA  Anesthesia Plan Comments: (Lab Results      Component                Value               Date                      WBC                      10.5                05/19/2020                HGB                      9.9 (L)             05/19/2020                HCT                       29.0 (L)            05/19/2020                MCV                      90.8                05/19/2020                PLT                      258  05/19/2020          )        Anesthesia Quick Evaluation

## 2020-05-19 NOTE — ED Notes (Addendum)
Patient attached to purewick 

## 2020-05-19 NOTE — ED Provider Notes (Signed)
[facility]-EMERGENCY DEPT Provider Note   CSN: [id number] Arrival date & time: 05/19/20  3716     History Chief Complaint  Patient presents with  . GI Bleeding    Kelsey [name] is a 74 y.o. female with a history of hypertension, TIA (2019), and pacemaker implant.  Patient presents with a complaint of melena and bright red blood mixed with emesis.   She reports that her symptoms began this morning at 03:30.  Patient reports 3 episodes of melena.  Patient also reports 3 episodes of vomiting; she states that prior blood was mixed in with each episode of vomiting.  Patient reports that nothing has made her symptoms better or worse.  Patient endorses lightheadedness and dizziness when standing for prolonged periods of time.  patient denies any abdominal pain, distention, epigastric pain, rectal pain, dysuria, vaginal bleeding,  fever, chills, syncopal episodes, chest ain or shortness of breath.  Patient is not on any blood thinners, but does report 81 mg aspirin daily (did not take her aspirin today).    Patient denies any previous episodes of GI bleeding, peptic ulcer disease, or inflammatory bowel disease.  Patient reports having a colonoscopy done this year at [facility] and denies any abnormal results.  Patient denies any family history of colon cancer.  Patient endorses social drinking, indicates that she is to be a heavy drinker but often for the last 10 years.  Patient endorses 30 years smoking history.    HPI     Past Medical History:  Diagnosis Date  . Arthritis    OA  . Heart murmur    refer to cardiologist note from dr Dietrich Pates  . Hyperlipidemia   . Hypertension   . Left ureteral calculus   . 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 Wears dentures    upper  and partial lower  . Wears glasses     Patient Active Problem List   Diagnosis Date Noted  . GIB (gastrointestinal bleeding) 05/19/2020  . Pacemaker  08/31/2019  . Complete heart block (HCC) 07/15/2018  . TIA (transient ischemic attack) 06/03/2018  . Dizziness 06/03/2018  . CVA (cerebral vascular accident) (HCC) 05/31/2018  . Bradycardia 05/27/2018  . Moderate aortic stenosis 05/27/2018  . Osteoarthritis of both knees 07/02/2013  . Tobacco abuse 04/01/2012  . 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 Midsternal chest pain 04/01/2012  . CHEST PAIN-UNSPECIFIED 03/09/2010  . Hyperlipidemia 10/13/2009  . HYPERTENSION, BENIGN 10/13/2009  . UNSPECIFIED TACHYCARDIA 10/13/2009  . DYSPNEA 10/13/2009    Past Surgical History:  Procedure Laterality Date  . ABDOMINAL HYSTERECTOMY    . CARDIOVASCULAR STRESS TEST  03-31-2012   normal perfusion study/  no ischemia/  ef 69%  . COLONOSCOPY WITH ESOPHAGOGASTRODUODENOSCOPY (EGD)  09-21-2003  . CORNEAL TRANSPLANT Left 2002  . CYSTOSCOPY WITH URETEROSCOPY AND STENT PLACEMENT Left 01/07/2014   Procedure: CYSTOSCOPY WITH LEFT RETRGRADE, URETEROSCOPY , AND LASER LITHOTRIPSY STONE EXTRACTION AND LEFT STENT PLACEMENT;  Surgeon: Crist Fat, MD;  Location: [facility];  Service: Urology;  Laterality: Left;  . FOREIGN BODY REMOVAL Left 02/10/2020   Procedure: Removal of retained foreign body of left great toe;  Surgeon: Toni Arthurs, MD;  Location: [facility];  Service: Orthopedics;  Laterality: Left;   . HOLMIUM LASER APPLICATION Left 01/07/2014   Procedure: HOLMIUM LASER APPLICATION;  Surgeon: Crist Fat, MD;  Location: [facility];  Service: Urology;  Laterality: Left;  . PACEMAKER IMPLANT N/A 07/15/2018   Procedure: PACEMAKER IMPLANT;  Surgeon: Marinus Maw, MD;  Location: [facility] INVASIVE CV LAB;  Service: Cardiovascular;  Laterality: N/A;  . REVISION TOTAL KNEE ARTHROPLASTY  left 08-01-2003/  right 02-20-02006  . TOTAL KNEE ARTHROPLASTY  left 03-30-2003/  right 11-05-2004   post op left knee I & D  arthroscopic lavage 04-20-2003  . TRANSTHORACIC ECHOCARDIOGRAM  08-28-2011  dr Gunnar Fusi ross   moderate lvh/  ef 60-65%/  grade 2 diastolic dysfunction/ normal lv function / hyperdynamic lv with outflow murmur / turbulance through LVOT , mild SAM/ moderate lae/ moderate tr/  trivial mv & pv     OB History   No obstetric history on file.     Family History  Problem Relation Age of Onset  . Hypertension Father   . Hypertension Brother     Social History   Tobacco Use  . Smoking status: Current Some Day Smoker    Packs/day: 0.50    Types: Cigarettes  . Smokeless tobacco: Never Used  . Tobacco comment: per pt occasional smoker  Vaping Use  . Vaping Use: Never used  Substance Use Topics  . Alcohol use: Yes    Alcohol/week: 3.0 standard drinks    Types: 3 Glasses of wine per week    Comment: social  . Drug use: No    Home Medications Prior to Admission medications   Medication Sig Start Date End Date Taking? Authorizing Provider  amLODipine (NORVASC) 10 MG tablet Take 10 mg by mouth daily. 08/11/19  Yes [provider]  aspirin EC 81 MG tablet Take 81 mg by mouth daily.   Yes [provider]  atorvastatin (LIPITOR) 40 MG tablet Take 40 mg by mouth every evening. 04/13/13  Yes Pricilla Riffle, MD  Calcium Carb-Cholecalciferol (CALCIUM + D3 PO) Take 1 capsule by mouth daily.   Yes [provider]  chlorthalidone (HYGROTON) 25 MG tablet TAKE 1 TABLET BY MOUTH EVERY DAY Patient taking differently: Take 25 mg by mouth daily.  12/28/19  Yes Marinus Maw, MD  EPINEPHrine (EPIPEN 2-PAK) 0.3 mg/0.3 mL IJ SOAJ injection Inject 0.3 mg into the muscle as needed for anaphylaxis.   Yes [provider]  ferrous sulfate 325 (65 FE) MG EC tablet Take 1 tablet by mouth daily. 05/06/19  Yes [provider]  HYDROcodone-acetaminophen (NORCO) 10-325 MG tablet Take 1 tablet by mouth 2 (two) times daily as needed for severe pain.    Yes [provider]  Multiple Vitamins-Minerals (MULTIVITAMIN WITH MINERALS) tablet Take 1 tablet by mouth daily.   Yes [provider]  naproxen sodium (ALEVE) 220 MG tablet Take 220 mg by mouth daily as needed (pain).   Yes [provider]  potassium chloride SA (KLOR-CON M20) 20 MEQ tablet Take 1 tablet (20 mEq total) by mouth 2 (two) times daily. 07/13/18 05/19/20 Yes Marinus Maw, MD  prednisoLONE acetate (PRED FORTE) 1 % ophthalmic suspension Place 1 drop into the left eye daily. 02/04/20  Yes [provider]  timolol (TIMOPTIC) 0.5 % ophthalmic solution Place 1 drop into the left eye every morning. 01/24/20  Yes [provider]  docusate sodium (COLACE) 100 MG capsule Take 1 capsule (100 mg total) by mouth 2 (two) times daily. While taking narcotic pain medicine. Patient not taking: Reported on 05/19/2020 02/10/20   Jacinta Shoe, PA-C  senna (SENOKOT) 8.6 MG TABS tablet Take  2 tablets (17.2 mg total) by mouth 2 (two) times daily. Patient not taking: Reported on 05/19/2020 02/10/20   Jacinta Shoe, PA-C    Allergies    Bee venom and Oxycodone-acetaminophen  Review of Systems   Review of Systems  Constitutional: Negative for appetite change, chills, diaphoresis, fever and unexpected weight change.  Eyes: Positive for visual disturbance (left eye, baseline per pt).  Respiratory: Negative for cough and shortness of breath.   Cardiovascular: Negative for chest pain and leg swelling.  Gastrointestinal: Positive for blood in stool. Negative for abdominal distention, abdominal pain, constipation, diarrhea, nausea, rectal pain and vomiting.  Genitourinary: Negative for difficulty urinating, dysuria, flank pain, hematuria, pelvic pain, vaginal bleeding and vaginal pain.  Musculoskeletal: Negative for back pain and neck pain.  Skin: Negative for color change and rash.  Neurological: Positive for dizziness and light-headedness. Negative for syncope and headaches.    Psychiatric/Behavioral: Negative for confusion.    Physical Exam Updated Vital Signs BP 98/67 (BP Location: Right Arm)   Pulse 71   Temp 98.7 F (37.1 C) (Oral)   Resp 20   Ht  (1.626 m)   Wt 87.5 kg   SpO2 94%   BMI 33.13 kg/m   Physical Exam Exam conducted with a chaperone present (RN Leeroy Bock was present during rectal exam).  Constitutional:      General: She is not in acute distress.    Appearance: She is obese. She is not ill-appearing, toxic-appearing or diaphoretic.  HENT:     Head: Normocephalic.  Cardiovascular:     Rate and Rhythm: Normal rate and regular rhythm.     Heart sounds: Murmur heard.   Pulmonary:     Effort: Pulmonary effort is normal.     Breath sounds: Normal breath sounds.  Abdominal:     General: Abdomen is protuberant. Bowel sounds are normal. There is no distension. There are no signs of injury.     Palpations: Abdomen is soft.     Tenderness: There is no abdominal tenderness.     Hernia: No hernia is present.  Genitourinary:    Rectum: Guaiac result positive. No mass, tenderness, anal fissure or external hemorrhoid. Normal anal tone.     Comments: Melena was found around patient's rectum Skin:    General: Skin is warm and dry.  Neurological:     General: No focal deficit present.     Mental Status: She is alert.  Psychiatric:        Behavior: Behavior is cooperative.     ED Results / Procedures / Treatments   Labs (all labs ordered are listed, but only abnormal results are displayed) Labs Reviewed  COMPREHENSIVE METABOLIC PANEL - Abnormal; Notable for the following components:      Result Value   Glucose, Bld 151 (*)    BUN 42 (*)    All other components within normal limits  CBC - Abnormal; Notable for the following components:   RBC 3.59 (*)    Hemoglobin 10.5 (*)    HCT 32.6 (*)    All other components within normal limits  POC OCCULT BLOOD, ED - Abnormal; Notable for the following components:   Fecal Occult Bld  POSITIVE (*)    All other components within normal limits  I-STAT CHEM 8, ED - Abnormal; Notable for the following components:   BUN 41 (*)    Glucose, Bld 141 (*)    Hemoglobin 9.9 (*)    HCT 29.0 (*)  All other components within normal limits  RESP PANEL BY RT-PCR (FLU A&B, COVID) ARPGX2  URINALYSIS, ROUTINE W REFLEX MICROSCOPIC  BASIC METABOLIC PANEL  CBC WITH DIFFERENTIAL/PLATELET  TYPE AND SCREEN    EKG EKG Interpretation  Date/Time:  Friday May 19 2020 10:38:32 EST Ventricular Rate:  100 PR Interval:    QRS Duration: 81 QT Interval:  365 QTC Calculation: 471 R Axis:   22 Text Interpretation: Sinus tachycardia Abnormal R-wave progression, early transition LVH by voltage Otherwise no significant change Confirmed by Melene Plan [phone]) on 05/19/2020 12:05:56 PM   Radiology DG Abdomen Acute W/Chest  Result Date: 05/19/2020 CLINICAL DATA:  Epigastric pain.  Vomiting. EXAM: DG ABDOMEN ACUTE WITH 1 VIEW CHEST COMPARISON:  12/20/2013 CT abdomen pelvis. FINDINGS: No focal consolidation, pneumothorax or pleural effusion. Cardiomediastinal silhouette within normal limits. Aortic atherosclerotic calcifications. Left chest pacing device with leads terminating over the right heart. Multilevel spondylosis. Nonobstructive bowel gas pattern. Mild colorectal stool burden. Left hemipelvic dystrophic calcifications, unchanged. No suspicious calcific densities. No acute osseous abnormality. IMPRESSION: 1. No acute cardiopulmonary disease. 2. Nonobstructive bowel gas pattern. Mild colorectal stool burden. Electronically Signed   By: Stana Bunting M.D.   On: 05/19/2020 11:34    Procedures Procedures (including critical care time)  Medications Ordered in ED Medications  pantoprazole (PROTONIX) 80 mg in sodium chloride 0.9 % 100 mL (0.8 mg/mL) infusion (8 mg/hr Intravenous New Bag/Given 05/19/20 1512)  atorvastatin (LIPITOR) tablet 40 mg (40 mg Oral Given 05/19/20 1838)  ferrous  sulfate tablet 325 mg (325 mg Oral Not Given 05/19/20 1829)  multivitamin with minerals tablet 1 tablet (1 tablet Oral Not Given 05/19/20 1829)  prednisoLONE acetate (PRED FORTE) 1 % ophthalmic suspension 1 drop (1 drop Left Eye Not Given 05/19/20 1829)  timolol (TIMOPTIC) 0.5 % ophthalmic solution 1 drop (has no administration in time range)  0.9 %  sodium chloride infusion ( Intravenous New Bag/Given 05/19/20 1848)  ondansetron (ZOFRAN) tablet 4 mg (has no administration in time range)    Or  ondansetron (ZOFRAN) injection 4 mg (has no administration in time range)  sodium chloride 0.9 % bolus 1,000 mL (0 mLs Intravenous Stopped 05/19/20 1333)  pantoprazole (PROTONIX) injection 40 mg (40 mg Intravenous Given 05/19/20 1048)  ondansetron (ZOFRAN) injection 4 mg (4 mg Intravenous Given 05/19/20 1048)    ED Course  I have reviewed the triage vital signs and the nursing notes.  Pertinent labs & imaging results that were available during my care of the patient were reviewed by me and considered in my medical decision making (see chart for details).    MDM Rules/Calculators/A&P                          Alert 74 year old female in no acute distress presents with chief complaint of melena and red blood in emesis.  Bright red blood was noted in patient's emesis by EMS.  She reports that her melena and vomiting began this morning around 03:30.  Patient reports 3 episodes of melena.  She also reports 3 episodes of vomiting with bright red blood noted time.  Patient denies any abdominal distention, abdominal pain, rectal pain, pelvic pain, vaginal pain, dysuria, hematuria, shortness of breath, pain, syncopal episodes.  She denies any previous history of GI bleed, history of peptic ulcer disease, cirrhosis or GI problems, or NSAID use.  Reports current occasional alcohol use, with history of heavy drinking that stopped 10 years prior.  Patient reports  [facility], and per patient no abnormalities reported.  Denies any family history of colorectal cancer.    Patient's abdomen was soft nondistended and nontender with normoactive bowel sounds.  There was performed with female RN chaperone present.  She was found to have melena around rectum, no hemorrhoids, or anal fissures noted; Hemoccult was positive.  CBC showed hemoglobin at 10.5 and hematocrit 32.6 which represented approximately one-point drop in patient's baseline.  CMP showed BUN elevated at 42, Kos elevated at 131 and all other labs within normal limits.  Area of abdomen and chest showed acute cardiopulmonary disease; nonobstructive bowel gas pattern with mild colorectal stool burden.  She was given Zofran, Protonix, and fluid bolus.    Based on patient's melena and blood in her emesis concern for GI bleed.  Based on chest x-ray bowel perforation less likely.  GI was consulted, spoke with Dr. Ewing SchleinMagod and will see the patient.  Hospitalist was consulted, spoke with Dr. Ronaldo MiyamotoKyle who will admit the patient.    I discussed this patient and their care with Dr. Adela LankFloyd.        Final Clinical Impression(s) / ED Diagnoses Final diagnoses:  Melena  Non-intractable vomiting with nausea, unspecified vomiting type    Rx / DC Orders ED Discharge Orders    None       Haskel SchroederBadalamente, Dekayla Prestridge R, PA-C 05/19/20 1850    Melene PlanFloyd, Dan, DO 05/22/20 2258

## 2020-05-19 NOTE — Op Note (Signed)
[facility] Patient Name: Kelsey [name] Procedure Date: 05/19/2020 MRN: [id number] Attending MD: Vida Rigger , MD Date of Birth: 16-Apr-1946 CSN: [id number] Age: 74 Admit Type: Inpatient Procedure:                Upper GI endoscopy Indications:              Acute post hemorrhagic anemia, Hematemesis Providers:                Vida Rigger, MD, Dwain Sarna, RN, Michele Mcalpine                            Technician Referring MD:              Medicines:                Propofol total dose 150 mg IV, 100 mg IV lidocaine Complications:            No immediate complications. Estimated Blood Loss:     Estimated blood loss: none. Procedure:                Pre-Anesthesia Assessment:                           - Prior to the procedure, a History and Physical                            was performed, and patient medications and                            allergies were reviewed. The patient's tolerance of                            previous anesthesia was also reviewed. The risks                            and benefits of the procedure and the sedation                            options and risks were discussed with the patient.                            All questions were answered, and informed consent                            was obtained. Prior Anticoagulants: The patient has                            taken no previous anticoagulant or antiplatelet                            agents except for aspirin. ASA Grade Assessment: II                            - A patient with mild systemic disease. After  reviewing the risks and benefits, the patient was                            deemed in satisfactory condition to undergo the                            procedure.                           After obtaining informed consent, the endoscope was                            passed under direct vision. Throughout the                            procedure, the  patient's blood pressure, pulse, and                            oxygen saturations were monitored continuously. The                            GIF-H190 ([id number] Olympus gastroscope was                            introduced through the mouth, and advanced to the                            third part of duodenum. The upper GI endoscopy was                            accomplished without difficulty. The patient                            tolerated the procedure well. Scope In: Scope Out: Findings:      The larynx was normal.      A Tiny hiatal hernia was present.      Hematin (altered blood/coffee-ground-like material) was found in the       gastric fundus, in the gastric body and in the gastric antrum. Lavage of       the area was performed using a moderate amount of sterile water,       resulting in clearance with adequate visualization.      One non-bleeding cratered gastric ulcer with no stigmata of bleeding was       found in the gastric antrum.      The duodenal bulb, first portion of the duodenum, second portion of the       duodenum and third portion of the duodenum were normal.      The exam was otherwise without abnormality. Impression:               - Normal larynx.                           - Tiny hiatal hernia.                           -  Hematin (altered blood/coffee-ground-like                            material) in the gastric antrum, in the gastric                            fundus and in the gastric body.                           - Non-bleeding gastric ulcer with no stigmata of                            bleeding.                           - Normal duodenal bulb, first portion of the                            duodenum, second portion of the duodenum and third                            portion of the duodenum.                           - The examination was otherwise normal.                           - No specimens collected. Moderate Sedation:      Not Applicable  - Patient had care per Anesthesia. Recommendation:           - Clear liquid diet today.                           - No aspirin, ibuprofen, naproxen, or other                            non-steroidal anti-inflammatory drugs.                           - Repeat upper endoscopy in 2 months to evaluate                            the response to therapy.                           - Return to GI office in 1 month.                           - Telephone GI clinic if symptomatic PRN. Procedure Code(s):        --- Professional ---                           [phone], Esophagogastroduodenoscopy, flexible,                            transoral; diagnostic, including collection of  specimen(s) by brushing or washing, when performed                            (separate procedure) Diagnosis Code(s):        --- Professional ---                           K44.9, Diaphragmatic hernia without obstruction or                            gangrene                           K92.2, Gastrointestinal hemorrhage, unspecified                           K25.9, Gastric ulcer, unspecified as acute or                            chronic, without hemorrhage or perforation                           D62, Acute posthemorrhagic anemia                           K92.0, Hematemesis CPT copyright 2019 [employer] Association. All rights reserved. The codes documented in this report are preliminary and upon coder review may  be revised to meet current compliance requirements. Vida RiggerMarc Charnee Turnipseed, MD 05/19/2020 2:40:36 PM This report has been signed electronically. Number of Addenda: 0

## 2020-05-19 NOTE — Anesthesia Postprocedure Evaluation (Signed)
Anesthesia Post Note  Patient: Kelsey [name]  Procedure(s) Performed: ESOPHAGOGASTRODUODENOSCOPY (EGD) (N/A )     Patient location during evaluation: Endoscopy Anesthesia Type: MAC Level of consciousness: awake and alert Pain management: pain level controlled Vital Signs Assessment: post-procedure vital signs reviewed and stable Respiratory status: spontaneous breathing, nonlabored ventilation, respiratory function stable and patient connected to nasal cannula oxygen Cardiovascular status: stable and blood pressure returned to baseline Postop Assessment: no apparent nausea or vomiting Anesthetic complications: no   No complications documented.  Last Vitals:  Vitals:   05/19/20 1442 05/19/20 1446  BP: (!) 85/57 (!) 90/46  Pulse: 73 80  Resp: 19 20  Temp:    SpO2: 94% 93%    Last Pain:  Vitals:   05/19/20 1446  TempSrc:   PainSc: 0-No pain                 Belenda Cruise P Korayma Hagwood

## 2020-05-19 NOTE — ED Triage Notes (Signed)
Per EMS- Patient reported vomiting at 0200 today. Patient went back to bed and when she woke this AM she had blood in her diarrhea and emesis. EMS stated that they did see bright red blood in her vomitus.

## 2020-05-19 NOTE — ED Notes (Signed)
Patient is off the floor for procedure.

## 2020-05-19 NOTE — ED Notes (Signed)
Pt transported for EDG. Unable to give pantoprazole at this time.

## 2020-05-19 NOTE — Plan of Care (Signed)

## 2020-05-19 NOTE — Transfer of Care (Signed)
Immediate Anesthesia Transfer of Care Note  Patient: Kelsey [name]  Procedure(s) Performed: ESOPHAGOGASTRODUODENOSCOPY (EGD) (N/A )  Patient Location: Endoscopy Unit  Anesthesia Type:MAC  Level of Consciousness: drowsy and patient cooperative  Airway & Oxygen Therapy: Patient Spontanous Breathing and Patient connected to face mask oxygen  Post-op Assessment: Report given to RN and Post -op Vital signs reviewed and stable  Post vital signs: Reviewed and stable  Last Vitals:  Vitals Value Taken Time  BP    Temp    Pulse    Resp    SpO2      Last Pain:  Vitals:   05/19/20 1346  TempSrc: Oral  PainSc: 0-No pain         Complications: No complications documented.

## 2020-05-20 DIAGNOSIS — M1990 Unspecified osteoarthritis, unspecified site: Secondary | ICD-10-CM | POA: Diagnosis present

## 2020-05-20 DIAGNOSIS — E876 Hypokalemia: Secondary | ICD-10-CM | POA: Diagnosis not present

## 2020-05-20 DIAGNOSIS — Z8673 Personal history of transient ischemic attack (TIA), and cerebral infarction without residual deficits: Secondary | ICD-10-CM | POA: Diagnosis not present

## 2020-05-20 DIAGNOSIS — K219 Gastro-esophageal reflux disease without esophagitis: Secondary | ICD-10-CM | POA: Diagnosis present

## 2020-05-20 DIAGNOSIS — K449 Diaphragmatic hernia without obstruction or gangrene: Secondary | ICD-10-CM | POA: Diagnosis present

## 2020-05-20 DIAGNOSIS — Z90710 Acquired absence of both cervix and uterus: Secondary | ICD-10-CM | POA: Diagnosis not present

## 2020-05-20 DIAGNOSIS — F17210 Nicotine dependence, cigarettes, uncomplicated: Secondary | ICD-10-CM | POA: Diagnosis not present

## 2020-05-20 DIAGNOSIS — K254 Chronic or unspecified gastric ulcer with hemorrhage: Secondary | ICD-10-CM | POA: Diagnosis not present

## 2020-05-20 DIAGNOSIS — Z8249 Family history of ischemic heart disease and other diseases of the circulatory system: Secondary | ICD-10-CM | POA: Diagnosis not present

## 2020-05-20 DIAGNOSIS — Z947 Corneal transplant status: Secondary | ICD-10-CM | POA: Diagnosis not present

## 2020-05-20 DIAGNOSIS — Z79899 Other long term (current) drug therapy: Secondary | ICD-10-CM | POA: Diagnosis not present

## 2020-05-20 DIAGNOSIS — K5790 Diverticulosis of intestine, part unspecified, without perforation or abscess without bleeding: Secondary | ICD-10-CM | POA: Diagnosis present

## 2020-05-20 DIAGNOSIS — K921 Melena: Secondary | ICD-10-CM | POA: Diagnosis present

## 2020-05-20 DIAGNOSIS — E785 Hyperlipidemia, unspecified: Secondary | ICD-10-CM | POA: Diagnosis not present

## 2020-05-20 DIAGNOSIS — Z885 Allergy status to narcotic agent status: Secondary | ICD-10-CM | POA: Diagnosis not present

## 2020-05-20 DIAGNOSIS — Z87442 Personal history of urinary calculi: Secondary | ICD-10-CM | POA: Diagnosis not present

## 2020-05-20 DIAGNOSIS — K648 Other hemorrhoids: Secondary | ICD-10-CM | POA: Diagnosis present

## 2020-05-20 DIAGNOSIS — Z950 Presence of cardiac pacemaker: Secondary | ICD-10-CM | POA: Diagnosis not present

## 2020-05-20 DIAGNOSIS — D62 Acute posthemorrhagic anemia: Secondary | ICD-10-CM | POA: Diagnosis not present

## 2020-05-20 DIAGNOSIS — Z96652 Presence of left artificial knee joint: Secondary | ICD-10-CM | POA: Diagnosis not present

## 2020-05-20 DIAGNOSIS — Z7982 Long term (current) use of aspirin: Secondary | ICD-10-CM | POA: Diagnosis not present

## 2020-05-20 DIAGNOSIS — I10 Essential (primary) hypertension: Secondary | ICD-10-CM | POA: Diagnosis not present

## 2020-05-20 DIAGNOSIS — Z20822 Contact with and (suspected) exposure to covid-19: Secondary | ICD-10-CM | POA: Diagnosis not present

## 2020-05-20 DIAGNOSIS — Z91030 Bee allergy status: Secondary | ICD-10-CM | POA: Diagnosis not present

## 2020-05-20 LAB — CBC WITH DIFFERENTIAL/PLATELET
Abs Immature Granulocytes: 0.05 10*3/uL (ref 0.00–0.07)
Basophils Absolute: 0 10*3/uL (ref 0.0–0.1)
Basophils Relative: 0 %
Eosinophils Absolute: 0.1 10*3/uL (ref 0.0–0.5)
Eosinophils Relative: 1 %
HCT: 24.1 % — ABNORMAL LOW (ref 36.0–46.0)
Hemoglobin: 7.6 g/dL — ABNORMAL LOW (ref 12.0–15.0)
Immature Granulocytes: 1 %
Lymphocytes Relative: 32 %
Lymphs Abs: 3.5 10*3/uL (ref 0.7–4.0)
MCH: 29.3 pg (ref 26.0–34.0)
MCHC: 31.5 g/dL (ref 30.0–36.0)
MCV: 93.1 fL (ref 80.0–100.0)
Monocytes Absolute: 0.8 10*3/uL (ref 0.1–1.0)
Monocytes Relative: 7 %
Neutro Abs: 6.5 10*3/uL (ref 1.7–7.7)
Neutrophils Relative %: 59 %
Platelets: 192 10*3/uL (ref 150–400)
RBC: 2.59 MIL/uL — ABNORMAL LOW (ref 3.87–5.11)
RDW: 15.6 % — ABNORMAL HIGH (ref 11.5–15.5)
WBC: 11 10*3/uL — ABNORMAL HIGH (ref 4.0–10.5)
nRBC: 0 % (ref 0.0–0.2)

## 2020-05-20 LAB — BASIC METABOLIC PANEL
Anion gap: 10 (ref 5–15)
BUN: 26 mg/dL — ABNORMAL HIGH (ref 8–23)
CO2: 26 mmol/L (ref 22–32)
Calcium: 8.6 mg/dL — ABNORMAL LOW (ref 8.9–10.3)
Chloride: 103 mmol/L (ref 98–111)
Creatinine, Ser: 0.73 mg/dL (ref 0.44–1.00)
GFR, Estimated: >60 mL/min (ref >60)
Glucose, Bld: 106 mg/dL — ABNORMAL HIGH (ref 70–99)
Potassium: 3.2 mmol/L — ABNORMAL LOW (ref 3.5–5.1)
Sodium: 139 mmol/L (ref 135–145)

## 2020-05-20 LAB — HEMOGLOBIN AND HEMATOCRIT, BLOOD
HCT: 22.9 % — ABNORMAL LOW (ref 36.0–46.0)
Hemoglobin: 7.3 g/dL — ABNORMAL LOW (ref 12.0–15.0)

## 2020-05-20 MED ORDER — SODIUM CHLORIDE 0.9 % IV SOLN
510.0000 mg | Freq: Once | INTRAVENOUS | Status: AC
  Administered 2020-05-20: 510 mg via INTRAVENOUS
  Filled 2020-05-20: qty 510

## 2020-05-20 MED ORDER — POTASSIUM CHLORIDE CRYS ER 20 MEQ PO TBCR
40.0000 meq | EXTENDED_RELEASE_TABLET | Freq: Two times a day (BID) | ORAL | Status: AC
  Administered 2020-05-20 – 2020-05-21 (×4): 40 meq via ORAL
  Filled 2020-05-20 (×5): qty 2

## 2020-05-20 NOTE — Progress Notes (Signed)
[facility] Gastroenterology Progress Note  Kelsey [name] 74 y.o. 1945-10-27   Subjective: Denies abdominal pain or black stools overnight. No BMs overnight. Sitting in bedside chair. Tolerating clear liquid diet.  Objective: Vital signs: Vitals:   05/20/20 0556 05/20/20 0800  BP: (!) 98/56   Pulse: 73   Resp: 18   Temp: 98.3 F (36.8 C)   SpO2: 99% 98%    Physical Exam: Gen: alert, no acute distress, elderly, pleasant HEENT: anicteric sclera, poor dentition CV: RRR Chest: CTA B Abd: soft, nontender, nondistended, +BS   Lab Results: Recent Labs    05/19/20 1028 05/19/20 1028 05/19/20 1054 05/20/20 0549  NA 140   < > 140 139  K 3.5   < > 3.7 3.2*  CL 103   < > 102 103  CO2 25  --   --  26  GLUCOSE 151*   < > 141* 106*  BUN 42*   < > 41* 26*  CREATININE 0.94   < > 0.90 0.73  CALCIUM 9.5  --   --  8.6*   < > = values in this interval not displayed.   Recent Labs    05/19/20 1028  AST 25  ALT 23  ALKPHOS 80  BILITOT 0.9  PROT 6.5  ALBUMIN 3.9   Recent Labs    05/19/20 1028 05/19/20 1028 05/19/20 1054 05/20/20 0549  WBC 10.5  --   --  11.0*  NEUTROABS  --   --   --  6.5  HGB 10.5*   < > 9.9* 7.6*  HCT 32.6*   < > 29.0* 24.1*  MCV 90.8  --   --  93.1  PLT 258  --   --  192   < > = values in this interval not displayed.      Assessment/Plan: Gastric Ulcer bleed - Hgb drop to 7.6 but no sign of ongoing bleeding. Patient reports occasional Aleve use as outpt. Continue Protonix drip. Full liquids ok and if stable advance further tomorrow. Check H. Pylori serology and treat if positive. Dr. Ewing Schlein to f/u tomorrow.   Kelsey [name] 05/20/2020, 10:52 AM  Questions please call [phone] ID: Kelsey [name], female   DOB: 1946-03-31, 75 y.o.   MRN: [id number]

## 2020-05-20 NOTE — Progress Notes (Signed)
PROGRESS NOTE    Kelsey [name]  ZYS:[id number] DOB: [birth year] DOA: 05/19/2020 PCP: Caffie Damme, MD    Brief Narrative:  74 year old female with history of hypertension, hyperlipidemia. Presented to the ER with 1 day of hematemesis and hematochezia as it has black tarry stool. In the emergency room hemoglobin 10. FOBT positive. Blood pressure dropped responded to fluid. Admitted and underwent EGD.   Assessment & Plan:   Active Problems:   GIB (gastrointestinal bleeding)  Upper GI bleeding, acute blood loss anemia: Baseline hemoglobin 11-presented with hemoglobin 10-8-7.6 today. Currently no active evidence of bleeding. Will check every 12 hours and transfuse were less than 7. Will transfuse 1 unit of Feraheme today. Underwent EGD and found to have recent evidence of bleeding but no active bleeding gastric ulcer. Remains on Protonix infusion, followed by GI. Advance diet and mobilize. Patient on aspirin and occasional Aleve at home, discontinue NSAIDs.  Essential hypertension: Blood pressures were low on presentation. All antihypertensives on hold.  Hypokalemia: Replace and monitor levels. We will recheck magnesium and phosphorus in the morning.   DVT prophylaxis: SCDs Start: 05/19/20 1610   Code Status: Full code Family Communication: Patient's son on the phone Disposition Plan: Status is: Observation  The patient will require care spanning > 2 midnights and should be moved to inpatient because: Hemodynamically unstable and Inpatient level of care appropriate due to severity of illness  Dispo: The patient is from: Home              Anticipated d/c is to: Home              Anticipated d/c date is: 2 days              Patient currently is not medically stable to d/c.   Patient admitted with acute GI bleeding. She continues to drop her hemoglobin and monitoring for need of blood transfusion. Patient has consented for blood transfusion if needed. Patient is to stay on  Protonix infusion for 72 h. She will need inpatient hospitalization.      Consultants:   GI  Procedures:   EGD 12/3  Antimicrobials:   None   Subjective: Patient seen and examined in the morning rounds. Her son was on the phone with her. Patient denied any overnight events. After procedure she has not have any nausea or vomiting. She tolerated liquids well. No bowel movement yet after hospitalization.  Objective: Vitals:   05/20/20 0245 05/20/20 0556 05/20/20 0800 05/20/20 1320  BP: (!) 96/57 (!) 98/56  (!) 99/58  Pulse: 71 73  69  Resp: 18 18  17   Temp: 98.4 F (36.9 C) 98.3 F (36.8 C)  98.7 F (37.1 C)  TempSrc: Oral Oral  Oral  SpO2: 98% 99% 98% 96%  Weight:      Height:        Intake/Output Summary (Last 24 hours) at 05/20/2020 1357 Last data filed at 05/20/2020 1300 Gross per 24 hour  Intake 1939.57 ml  Output 750 ml  Net 1189.57 ml   Filed Weights   05/19/20 0955 05/19/20 1346  Weight: 87.5 kg 87.5 kg    Examination:  General exam: Appears calm and comfortable  Respiratory system: Clear to auscultation. Respiratory effort normal. Cardiovascular system: S1 & S2 heard, RRR. No JVD, murmurs, rubs, gallops or clicks. No pedal edema. Gastrointestinal system: Abdomen is nondistended, soft and nontender. No organomegaly or masses felt. Normal bowel sounds heard. Central nervous system: Alert and oriented. No focal neurological deficits.  Extremities: Symmetric 5 x 5 power. Skin: No rashes, lesions or ulcers Psychiatry: Judgement and insight appear normal. Mood & affect appropriate.     Data Reviewed: I have personally reviewed following labs and imaging studies  CBC: Recent Labs  Lab 05/19/20 1028 05/19/20 1054 05/20/20 0549  WBC 10.5  --  11.0*  NEUTROABS  --   --  6.5  HGB 10.5* 9.9* 7.6*  HCT 32.6* 29.0* 24.1*  MCV 90.8  --  93.1  PLT 258  --  192   Basic Metabolic Panel: Recent Labs  Lab 05/19/20 1028 05/19/20 1054 05/20/20 0549  NA  140 140 139  K 3.5 3.7 3.2*  CL 103 102 103  CO2 25  --  26  GLUCOSE 151* 141* 106*  BUN 42* 41* 26*  CREATININE 0.94 0.90 0.73  CALCIUM 9.5  --  8.6*   GFR: Estimated Creatinine Clearance: 68 mL/min (by C-G formula based on SCr of 0.73 mg/dL). Liver Function Tests: Recent Labs  Lab 05/19/20 1028  AST 25  ALT 23  ALKPHOS 80  BILITOT 0.9  PROT 6.5  ALBUMIN 3.9   No results for input(s): LIPASE, AMYLASE in the last 168 hours. No results for input(s): AMMONIA in the last 168 hours. Coagulation Profile: No results for input(s): INR, PROTIME in the last 168 hours. Cardiac Enzymes: No results for input(s): CKTOTAL, CKMB, CKMBINDEX, TROPONINI in the last 168 hours. BNP (last 3 results) No results for input(s): PROBNP in the last 8760 hours. HbA1C: No results for input(s): HGBA1C in the last 72 hours. CBG: No results for input(s): GLUCAP in the last 168 hours. Lipid Profile: No results for input(s): CHOL, HDL, LDLCALC, TRIG, CHOLHDL, LDLDIRECT in the last 72 hours. Thyroid Function Tests: No results for input(s): TSH, T4TOTAL, FREET4, T3FREE, THYROIDAB in the last 72 hours. Anemia Panel: No results for input(s): VITAMINB12, FOLATE, FERRITIN, TIBC, IRON, RETICCTPCT in the last 72 hours. Sepsis Labs: No results for input(s): PROCALCITON, LATICACIDVEN in the last 168 hours.  Recent Results (from the past 240 hour(s))  Resp Panel by RT-PCR (Flu A&B, Covid) Nasopharyngeal Swab     Status: None   Collection Time: 05/19/20 10:27 AM   Specimen: Nasopharyngeal Swab; Nasopharyngeal(NP) swabs in vial transport medium  Result Value Ref Range Status   SARS Coronavirus 2 by RT PCR NEGATIVE NEGATIVE Final    Comment: (NOTE) SARS-CoV-2 target nucleic acids are NOT DETECTED.  The SARS-CoV-2 RNA is generally detectable in upper respiratory specimens during the acute phase of infection. The lowest concentration of SARS-CoV-2 viral copies this assay can detect is 138 copies/mL. A negative  result does not preclude SARS-Cov-2 infection and should not be used as the sole basis for treatment or other patient management decisions. A negative result may occur with  improper specimen collection/handling, submission of specimen other than nasopharyngeal swab, presence of viral mutation(s) within the areas targeted by this assay, and inadequate number of viral copies(<138 copies/mL). A negative result must be combined with clinical observations, patient history, and epidemiological information. The expected result is Negative.  Fact Sheet for Patients:  BloggerCourse.com  Fact Sheet for Healthcare Providers:  SeriousBroker.it  This test is no t yet approved or cleared by the [place] FDA and  has been authorized for detection and/or diagnosis of SARS-CoV-2 by FDA under an Emergency Use Authorization (EUA). This EUA will remain  in effect (meaning this test can be used) for the duration of the COVID-19 declaration under Section 564(b)(1) of the Act,  21 U.S.C.section 360bbb-3(b)(1), unless the authorization is terminated  or revoked sooner.       Influenza A by PCR NEGATIVE NEGATIVE Final   Influenza B by PCR NEGATIVE NEGATIVE Final    Comment: (NOTE) The Xpert Xpress SARS-CoV-2/FLU/RSV plus assay is intended as an aid in the diagnosis of influenza from Nasopharyngeal swab specimens and should not be used as a sole basis for treatment. Nasal washings and aspirates are unacceptable for Xpert Xpress SARS-CoV-2/FLU/RSV testing.  Fact Sheet for Patients: BloggerCourse.com  Fact Sheet for Healthcare Providers: SeriousBroker.it  This test is not yet approved or cleared by the [place] FDA and has been authorized for detection and/or diagnosis of SARS-CoV-2 by FDA under an Emergency Use Authorization (EUA). This EUA will remain in effect (meaning this test can be used)  for the duration of the COVID-19 declaration under Section 564(b)(1) of the Act, 21 U.S.C. section 360bbb-3(b)(1), unless the authorization is terminated or revoked.  Performed at [facility], [address], [place], [place] [address]          Radiology Studies: DG Abdomen Acute W/Chest  Result Date: 05/19/2020 CLINICAL DATA:  Epigastric pain.  Vomiting. EXAM: DG ABDOMEN ACUTE WITH 1 VIEW CHEST COMPARISON:  12/20/2013 CT abdomen pelvis. FINDINGS: No focal consolidation, pneumothorax or pleural effusion. Cardiomediastinal silhouette within normal limits. Aortic atherosclerotic calcifications. Left chest pacing device with leads terminating over the right heart. Multilevel spondylosis. Nonobstructive bowel gas pattern. Mild colorectal stool burden. Left hemipelvic dystrophic calcifications, unchanged. No suspicious calcific densities. No acute osseous abnormality. IMPRESSION: 1. No acute cardiopulmonary disease. 2. Nonobstructive bowel gas pattern. Mild colorectal stool burden. Electronically Signed   By: Stana Bunting M.D.   On: 05/19/2020 11:34        Scheduled Meds: . atorvastatin  40 mg Oral QPM  . ferrous sulfate  325 mg Oral Daily  . multivitamin with minerals  1 tablet Oral Daily  . potassium chloride  40 mEq Oral BID  . prednisoLONE acetate  1 drop Left Eye Daily  . timolol  1 drop Left Eye q morning - 10a   Continuous Infusions: . sodium chloride 75 mL/hr at 05/20/20 0916  . ferumoxytol    . pantoprozole (PROTONIX) infusion 8 mg/hr (05/20/20 1251)     LOS: 0 days    Time spent: 30 min    Dorcas Carrow, MD [facility] Pager [phone]

## 2020-05-21 DIAGNOSIS — K254 Chronic or unspecified gastric ulcer with hemorrhage: Secondary | ICD-10-CM | POA: Diagnosis not present

## 2020-05-21 LAB — HEMOGLOBIN AND HEMATOCRIT, BLOOD
HCT: 19.6 % — ABNORMAL LOW (ref 36.0–46.0)
HCT: 27.7 % — ABNORMAL LOW (ref 36.0–46.0)
Hemoglobin: 6.1 g/dL — CL (ref 12.0–15.0)
Hemoglobin: 8.9 g/dL — ABNORMAL LOW (ref 12.0–15.0)

## 2020-05-21 LAB — PREPARE RBC (CROSSMATCH)

## 2020-05-21 LAB — ABO/RH: ABO/RH(D): O POS

## 2020-05-21 MED ORDER — SODIUM CHLORIDE 0.9% IV SOLUTION: Freq: Once | INTRAVENOUS | Status: AC

## 2020-05-21 NOTE — Progress Notes (Signed)
Kelsey [name] 10:30 AM  Subjective: Patient actually feels fine without any GI complaints and only one bowel movement yesterday which was dark and none today  Objective: Vital signs stable afebrile no acute distress abdomen is soft nontender hemoglobin decreased hopefully just hydration no BUN drawn  Assessment: Gastric ulcer with bleed  Plan: We will continue to observe consider repeat endoscopy if signs of active bleeding await H. pylori antibody  [facility] E  office [phone] After 5PM or if no answer call [phone]

## 2020-05-21 NOTE — Progress Notes (Addendum)
Patient tolerated one unit of blood, no reaction noted. Patient's IV sites were infiltrated, IV team placed a new IV in the left forearm.  [facility]

## 2020-05-21 NOTE — Progress Notes (Addendum)
PROGRESS NOTE    Kelsey [name]  WVP:[id number] DOB: February 19, 1946 DOA: 05/19/2020 PCP: Caffie Damme, MD    Brief Narrative:  Patient is a 74 years old female with past medical history of hypertension, hyperlipidemia presented to the hospital with hematemesis and hematochezia with black tarry stools.  In the ED, patient had a hemoglobin of around 10 and was mildly hypotensive which responded with IV fluids.  Patient was then admitted to hospital for GI work-up.  Sent underwent EGD on 05/19/2020 with findings of nonbleeding gastric ulcer and was on Protonix drip.  Assessment & Plan:   Active Problems:   GIB (gastrointestinal bleeding)  Upper GI bleeding, acute blood loss anemia: Patient underwent esophagogastroduodenoscopy on 05/19/2020 with findings of gastric ulcer which was not actively bleeding but is currently on Protonix drip.  Hemoglobin has dropped today.  Baseline hemoglobin of around 11.0.  Received 1 unit of Feraheme yesterday.  Will transfuse 1 unit of packed RBC today.  Spoke with the patient about it.  Patient was on NSAIDs at home which will be discontinued on discharge.  GI on board and will continue to observe the patient at this time.  Will need repeat endoscopy if signs of active bleeding.  Await H. pylori antibody.  On full liquids at this time.  Essential hypertension: Blood pressure borderline low.  Hold antihypertensive medication for now.  Hypokalemia: Replenished yesterday. check Levels in a.m.   DVT prophylaxis: SCDs Start: 05/19/20 1610   Code Status:  Full code  Family Communication:  None today  Disposition Plan:   Status is: inpatient  The patient will require inpatient because: GI bleed, on Protonix drip, hemoglobin drop again today.  GI follow-up, PRBC transfusion  Dispo: The patient is from: Home              Anticipated d/c is to: Home              Anticipated d/c date is: 2 days              Patient currently is not medically stable to  d/c.   Consultants:   GI  Procedures:   EGD 05/19/20  Antimicrobials:   None   Subjective: Today, patient was seen and examined.   Denies nausea vomiting abdominal pain.  Has not had a bowel movement since yesterday, yesterday she had 1 dark bowel movement.  She denies dizziness lightheadedness chest pain or shortness of breath.  Objective: Vitals:   05/20/20 0800 05/20/20 1320 05/20/20 2124 05/21/20 0547  BP:  (!) 99/58 (!) 96/55 94/70  Pulse:  69 72 70  Resp:  17 18 18   Temp:  98.7 F (37.1 C) 98.7 F (37.1 C) 98.6 F (37 C)  TempSrc:  Oral Oral Oral  SpO2: 98% 96% 98% 91%  Weight:      Height:        Intake/Output Summary (Last 24 hours) at 05/21/2020 0845 Last data filed at 05/21/2020 0557 Gross per 24 hour  Intake 1438.24 ml  Output 1100 ml  Net 338.24 ml   Filed Weights   05/19/20 0955 05/19/20 1346  Weight: 87.5 kg 87.5 kg   Body mass index is 33.13 kg/m.  Physical examination: General: Obese built, not in obvious distress HENT:   Mild pallor noted oral mucosa is moist.  Chest:  Clear breath sounds.  Diminished breath sounds bilaterally. No crackles or wheezes.  CVS: S1 &S2 heard. No murmur.  Regular rate and rhythm. Abdomen: Soft, nontender, nondistended.  Bowel sounds are heard.   Extremities: No cyanosis, clubbing or edema.  Peripheral pulses are palpable. Psych: Alert, awake and oriented, normal mood CNS:  No cranial nerve deficits.  Power equal in all extremities.   Skin: Warm and dry.  No rashes noted.  Data Reviewed: Reviewed the following data.  CBC: Recent Labs  Lab 05/19/20 1028 05/19/20 1054 05/20/20 0549 05/20/20 1634 05/21/20 0413  WBC 10.5  --  11.0*  --   --   NEUTROABS  --   --  6.5  --   --   HGB 10.5* 9.9* 7.6* 7.3* 6.1*  HCT 32.6* 29.0* 24.1* 22.9* 19.6*  MCV 90.8  --  93.1  --   --   PLT 258  --  192  --   --    Basic Metabolic Panel: Recent Labs  Lab 05/19/20 1028 05/19/20 1054 05/20/20 0549  NA 140 140 139   K 3.5 3.7 3.2*  CL 103 102 103  CO2 25  --  26  GLUCOSE 151* 141* 106*  BUN 42* 41* 26*  CREATININE 0.94 0.90 0.73  CALCIUM 9.5  --  8.6*   GFR: Estimated Creatinine Clearance: 68 mL/min (by C-G formula based on SCr of 0.73 mg/dL). Liver Function Tests: Recent Labs  Lab 05/19/20 1028  AST 25  ALT 23  ALKPHOS 80  BILITOT 0.9  PROT 6.5  ALBUMIN 3.9   No results for input(s): LIPASE, AMYLASE in the last 168 hours. No results for input(s): AMMONIA in the last 168 hours. Coagulation Profile: No results for input(s): INR, PROTIME in the last 168 hours. Cardiac Enzymes: No results for input(s): CKTOTAL, CKMB, CKMBINDEX, TROPONINI in the last 168 hours. BNP (last 3 results) No results for input(s): PROBNP in the last 8760 hours. HbA1C: No results for input(s): HGBA1C in the last 72 hours. CBG: No results for input(s): GLUCAP in the last 168 hours. Lipid Profile: No results for input(s): CHOL, HDL, LDLCALC, TRIG, CHOLHDL, LDLDIRECT in the last 72 hours. Thyroid Function Tests: No results for input(s): TSH, T4TOTAL, FREET4, T3FREE, THYROIDAB in the last 72 hours. Anemia Panel: No results for input(s): VITAMINB12, FOLATE, FERRITIN, TIBC, IRON, RETICCTPCT in the last 72 hours. Sepsis Labs: No results for input(s): PROCALCITON, LATICACIDVEN in the last 168 hours.  Recent Results (from the past 240 hour(s))  Resp Panel by RT-PCR (Flu A&B, Covid) Nasopharyngeal Swab     Status: None   Collection Time: 05/19/20 10:27 AM   Specimen: Nasopharyngeal Swab; Nasopharyngeal(NP) swabs in vial transport medium  Result Value Ref Range Status   SARS Coronavirus 2 by RT PCR NEGATIVE NEGATIVE Final    Comment: (NOTE) SARS-CoV-2 target nucleic acids are NOT DETECTED.  The SARS-CoV-2 RNA is generally detectable in upper respiratory specimens during the acute phase of infection. The lowest concentration of SARS-CoV-2 viral copies this assay can detect is 138 copies/mL. A negative result does  not preclude SARS-Cov-2 infection and should not be used as the sole basis for treatment or other patient management decisions. A negative result may occur with  improper specimen collection/handling, submission of specimen other than nasopharyngeal swab, presence of viral mutation(s) within the areas targeted by this assay, and inadequate number of viral copies(<138 copies/mL). A negative result must be combined with clinical observations, patient history, and epidemiological information. The expected result is Negative.  Fact Sheet for Patients:  BloggerCourse.com  Fact Sheet for Healthcare Providers:  SeriousBroker.it  This test is no t yet approved or cleared by the  [place] [employer] and  has been authorized for detection and/or diagnosis of SARS-CoV-2 by FDA under an TEFL teacher (EUA). This EUA will remain  in effect (meaning this test can be used) for the duration of the COVID-19 declaration under Section 564(b)(1) of the Act, 21 U.S.C.section 360bbb-3(b)(1), unless the authorization is terminated  or revoked sooner.       Influenza A by PCR NEGATIVE NEGATIVE Final   Influenza B by PCR NEGATIVE NEGATIVE Final    Comment: (NOTE) The Xpert Xpress SARS-CoV-2/FLU/RSV plus assay is intended as an aid in the diagnosis of influenza from Nasopharyngeal swab specimens and should not be used as a sole basis for treatment. Nasal washings and aspirates are unacceptable for Xpert Xpress SARS-CoV-2/FLU/RSV testing.  Fact Sheet for Patients: BloggerCourse.com  Fact Sheet for Healthcare Providers: SeriousBroker.it  This test is not yet approved or cleared by the [place] FDA and has been authorized for detection and/or diagnosis of SARS-CoV-2 by FDA under an Emergency Use Authorization (EUA). This EUA will remain in effect (meaning this test can be used) for the  duration of the COVID-19 declaration under Section 564(b)(1) of the Act, 21 U.S.C. section 360bbb-3(b)(1), unless the authorization is terminated or revoked.  Performed at [facility], [address] [place], [place] [address]       Radiology Studies: DG Abdomen Acute W/Chest  Result Date: 05/19/2020 CLINICAL DATA:  Epigastric pain.  Vomiting. EXAM: DG ABDOMEN ACUTE WITH 1 VIEW CHEST COMPARISON:  12/20/2013 CT abdomen pelvis. FINDINGS: No focal consolidation, pneumothorax or pleural effusion. Cardiomediastinal silhouette within normal limits. Aortic atherosclerotic calcifications. Left chest pacing device with leads terminating over the right heart. Multilevel spondylosis. Nonobstructive bowel gas pattern. Mild colorectal stool burden. Left hemipelvic dystrophic calcifications, unchanged. No suspicious calcific densities. No acute osseous abnormality. IMPRESSION: 1. No acute cardiopulmonary disease. 2. Nonobstructive bowel gas pattern. Mild colorectal stool burden. Electronically Signed   By: Stana Bunting M.D.   On: 05/19/2020 11:34      Scheduled Meds: . sodium chloride   Intravenous Once  . atorvastatin  40 mg Oral QPM  . ferrous sulfate  325 mg Oral Daily  . multivitamin with minerals  1 tablet Oral Daily  . potassium chloride  40 mEq Oral BID  . prednisoLONE acetate  1 drop Left Eye Daily  . timolol  1 drop Left Eye q morning - 10a   Continuous Infusions: . sodium chloride 75 mL/hr at 05/20/20 2325  . pantoprozole (PROTONIX) infusion 8 mg/hr (05/20/20 2317)     LOS: 1 day    Joycelyn Das, MD [facility] 05/21/2020

## 2020-05-21 NOTE — Progress Notes (Signed)
Critical lab  HGB 6.1 On call provider notified

## 2020-05-22 DIAGNOSIS — K254 Chronic or unspecified gastric ulcer with hemorrhage: Secondary | ICD-10-CM | POA: Diagnosis not present

## 2020-05-22 LAB — CBC
HCT: 26.2 % — ABNORMAL LOW (ref 36.0–46.0)
Hemoglobin: 8.4 g/dL — ABNORMAL LOW (ref 12.0–15.0)
MCH: 29.9 pg (ref 26.0–34.0)
MCHC: 32.1 g/dL (ref 30.0–36.0)
MCV: 93.2 fL (ref 80.0–100.0)
Platelets: 189 10*3/uL (ref 150–400)
RBC: 2.81 MIL/uL — ABNORMAL LOW (ref 3.87–5.11)
RDW: 15.9 % — ABNORMAL HIGH (ref 11.5–15.5)
WBC: 8.8 10*3/uL (ref 4.0–10.5)
nRBC: 0 % (ref 0.0–0.2)

## 2020-05-22 LAB — H. PYLORI ANTIBODY, IGG: H Pylori IgG: 0.26 Index Value (ref 0.00–0.79)

## 2020-05-22 LAB — BASIC METABOLIC PANEL
Anion gap: 7 (ref 5–15)
BUN: 6 mg/dL — ABNORMAL LOW (ref 8–23)
CO2: 25 mmol/L (ref 22–32)
Calcium: 9.1 mg/dL (ref 8.9–10.3)
Chloride: 111 mmol/L (ref 98–111)
Creatinine, Ser: 0.7 mg/dL (ref 0.44–1.00)
GFR, Estimated: >60 mL/min (ref >60)
Glucose, Bld: 101 mg/dL — ABNORMAL HIGH (ref 70–99)
Potassium: 4.1 mmol/L (ref 3.5–5.1)
Sodium: 143 mmol/L (ref 135–145)

## 2020-05-22 LAB — HEMOGLOBIN AND HEMATOCRIT, BLOOD
HCT: 28.7 % — ABNORMAL LOW (ref 36.0–46.0)
Hemoglobin: 9 g/dL — ABNORMAL LOW (ref 12.0–15.0)

## 2020-05-22 MED ORDER — PANTOPRAZOLE SODIUM 40 MG IV SOLR
40.0000 mg | Freq: Two times a day (BID) | INTRAVENOUS | Status: DC
  Administered 2020-05-22 – 2020-05-23 (×2): 40 mg via INTRAVENOUS
  Filled 2020-05-22 (×2): qty 40

## 2020-05-22 NOTE — Progress Notes (Addendum)
[facility] Gastroenterology Progress Note  LAURANN MCMORRIS 74 y.o. 1945/08/25  CC:  Upper GI bleeding  Subjective: Patient reports feeling well this morning. She denies any complaints.  She denies abdominal pain, nausea, vomiting, hematemesis. She states she had a BM today which was black with some red blood mixed in.  Last BM prior to this morning was 12/4.  ROS : Review of Systems  Respiratory: Negative for shortness of breath.   Cardiovascular: Negative for chest pain and palpitations.  Gastrointestinal: Positive for melena. Negative for abdominal pain, blood in stool, constipation, diarrhea, heartburn, nausea and vomiting.    Objective: Vital signs in last 24 hours: Vitals:   05/21/20 2102 05/22/20 0603  BP: (!) 95/53 106/70  Pulse: 69 69  Resp: 18 17  Temp: 98 F (36.7 C) 98.3 F (36.8 C)  SpO2: 97% (!) 71%    Physical Exam:  General:  Alert, oriented, cooperative, no distress  Head:  Normocephalic, without obvious abnormality, atraumatic  Eyes:  Mild conjunctival pallor, EOMs intact  Lungs:   Clear to auscultation bilaterally, respirations unlabored  Heart:  Regular rate and rhythm, S1, S2 normal  Abdomen:   Soft, non-tender, bowel sounds active all four quadrants,  no guarding or peritoneal signs  Extremities: Extremities normal, atraumatic, no  edema  Pulses: 2+ and symmetric    Lab Results: Recent Labs    05/20/20 0549 05/22/20 0430  NA 139 143  K 3.2* 4.1  CL 103 111  CO2 26 25  GLUCOSE 106* 101*  BUN 26* 6*  CREATININE 0.73 0.70  CALCIUM 8.6* 9.1   Recent Labs    05/19/20 1028  AST 25  ALT 23  ALKPHOS 80  BILITOT 0.9  PROT 6.5  ALBUMIN 3.9   Recent Labs    05/20/20 0549 05/20/20 1634 05/21/20 1801 05/22/20 0430  WBC 11.0*  --   --  8.8  NEUTROABS 6.5  --   --   --   HGB 7.6*   < > 8.9* 8.4*  HCT 24.1*   < > 27.7* 26.2*  MCV 93.1  --   --  93.2  PLT 192  --   --  189   < > = values in this interval not displayed.   No results for  input(s): LABPROT, INR in the last 72 hours.    Assessment: Upper GI Bleeding: Non-bleeding gastric ulcer with no stigmata of bleeding seen on EGD 12/3.  Bleeding appears to be resolved, though patient reported melenic stool with some red blood today.  Requested colonoscopy report from [facility]. -Hgb stable, 8.4 today as compared to 8.9 yesterday -BUN decreased, now normal (6) as compared to 42 on arrival  ?Small amt of red blood mixed in melenic stool: last colonoscopy 08/2019 at [facility]: internal hemorrhoids, diverticulosis, several polyps, otherwise unremarkable  Plan: Recommend inpatient monitoring one more day due to drop in Hgb to 6.1 yesterday.  Full liquid diet.  Protonix drip for 72h total will expire today.  Initiate 40 mg IV BID this evening.  Await H pylori serology. Treat if positive.  Continue to monitor H&H with transfusion as needed to maintain Hgb >7.  [facility] will follow.  Edrick Kins PA-C 05/22/2020, 9:30 AM  Contact #  [phone]

## 2020-05-22 NOTE — Progress Notes (Signed)
Patient walked around the unit several times.  Kelsey [name]

## 2020-05-22 NOTE — Progress Notes (Addendum)
PROGRESS NOTE    Kelsey [name]  ZOX:[id number] DOB: [birth year] DOA: 05/19/2020 PCP: Caffie Damme, MD    Brief Narrative:   Patient is a 74 years old female with past medical history of hypertension, hyperlipidemia presented to the hospital with hematemesis and hematochezia with black tarry stools.  In the ED, patient had a hemoglobin of around 10 and was mildly hypotensive which responded with IV fluids.  Patient was then admitted to hospital for GI work-up.  Patient underwent EGD on 05/19/2020 with findings of nonbleeding gastric ulcer and was on placed on  Protonix drip.  Assessment & Plan:    Active Problems:   GIB (gastrointestinal bleeding)  Upper GI bleeding, acute blood loss anemia: Patient underwent esophagogastroduodenoscopy on 05/19/2020 with findings of gastric ulcer which was not actively bleeding but is currently on Protonix drip.  Will be transitioned to Protonix IV twice daily.  Received 1 unit of Feraheme this admission.  She received a total of 2 units of packed RBC including 1 unit yesterday.  Will transfuse 1 unit of packed RBC today.  Spoke with the patient about it.  Patient was on NSAIDs at home which will be discontinued on discharge.  GI on board and will continue to observe the patient at this time.  Will need repeat endoscopy if signs of active bleeding.  Await H. pylori antibody.  On full liquids at this time.  Hemoglobin today at 8.4.  Hemoglobin yesterday evening was 6.1.  Essential hypertension: Blood pressure borderline low.  Hold antihypertensive medication for now.  Hypokalemia: Replenished yesterday. check Levels in a.m.  DVT prophylaxis: SCDs Start: 05/19/20 1610   Code Status:  Full code  Family Communication:  Spoke with patient's daughter Ms. Para March on the phone and updated her about the clinical condition of the patient.  Disposition Plan:   Status is: inpatient  The patient will require inpatient because: GI bleed,  hemoglobin drop,  PRBC transfusion overnight, will need continued monitoring.  Dispo: The patient is from: Home              Anticipated d/c is to: Home              Anticipated d/c date is: 1 to 2 days              Patient currently is not medically stable to d/c.   Consultants:   GI  Procedures:   EGD 05/19/20  Antimicrobials:   None   Subjective: Today, patient was seen and examined at bedside.  Denies any dizziness lightheadedness shortness of breath chest pain palpitation.  Had one dark bowel movement today.  Objective: Vitals:   05/21/20 1514 05/21/20 2102 05/22/20 0603 05/22/20 0941  BP: 120/63 (!) 95/53 106/70   Pulse: 60 69 69   Resp: 20 18 17    Temp: 98.2 F (36.8 C) 98 F (36.7 C) 98.3 F (36.8 C)   TempSrc: Oral Oral Oral   SpO2: (!) 74% 97% (!) 71%   Weight:    90.8 kg  Height:        Intake/Output Summary (Last 24 hours) at 05/22/2020 1257 Last data filed at 05/22/2020 0131 Gross per 24 hour  Intake 3206.54 ml  Output 300 ml  Net 2906.54 ml   Filed Weights   05/19/20 0955 05/19/20 1346 05/22/20 0941  Weight: 87.5 kg 87.5 kg 90.8 kg   Body mass index is 34.35 kg/m.  Physical examination: General: Obese built, not in obvious distress HENT:   Mild  pallor noted, oral mucosa is moist.  Chest:  Clear breath sounds.  Diminished breath sounds bilaterally. No crackles or wheezes.  CVS: S1 &S2 heard. No murmur.  Regular rate and rhythm. Abdomen: Soft, nontender, nondistended.  Bowel sounds are heard.   Extremities: No cyanosis, clubbing or edema.  Peripheral pulses are palpable. Psych: Alert, awake and oriented, normal mood CNS:  No cranial nerve deficits.  Power equal in all extremities.   Skin: Warm and dry.  No rashes noted.  Data Reviewed: Personally reviewed the following data.  CBC: Recent Labs  Lab 05/19/20 1028 05/19/20 1054 05/20/20 0549 05/20/20 1634 05/21/20 0413 05/21/20 1801 05/22/20 0430  WBC 10.5  --  11.0*  --   --   --  8.8  NEUTROABS  --    --  6.5  --   --   --   --   HGB 10.5*   < > 7.6* 7.3* 6.1* 8.9* 8.4*  HCT 32.6*   < > 24.1* 22.9* 19.6* 27.7* 26.2*  MCV 90.8  --  93.1  --   --   --  93.2  PLT 258  --  192  --   --   --  189   < > = values in this interval not displayed.   Basic Metabolic Panel: Recent Labs  Lab 05/19/20 1028 05/19/20 1054 05/20/20 0549 05/22/20 0430  NA 140 140 139 143  K 3.5 3.7 3.2* 4.1  CL 103 102 103 111  CO2 25  --  26 25  GLUCOSE 151* 141* 106* 101*  BUN 42* 41* 26* 6*  CREATININE 0.94 0.90 0.73 0.70  CALCIUM 9.5  --  8.6* 9.1   GFR: Estimated Creatinine Clearance: 69.3 mL/min (by C-G formula based on SCr of 0.7 mg/dL). Liver Function Tests: Recent Labs  Lab 05/19/20 1028  AST 25  ALT 23  ALKPHOS 80  BILITOT 0.9  PROT 6.5  ALBUMIN 3.9   No results for input(s): LIPASE, AMYLASE in the last 168 hours. No results for input(s): AMMONIA in the last 168 hours. Coagulation Profile: No results for input(s): INR, PROTIME in the last 168 hours. Cardiac Enzymes: No results for input(s): CKTOTAL, CKMB, CKMBINDEX, TROPONINI in the last 168 hours. BNP (last 3 results) No results for input(s): PROBNP in the last 8760 hours. HbA1C: No results for input(s): HGBA1C in the last 72 hours. CBG: No results for input(s): GLUCAP in the last 168 hours. Lipid Profile: No results for input(s): CHOL, HDL, LDLCALC, TRIG, CHOLHDL, LDLDIRECT in the last 72 hours. Thyroid Function Tests: No results for input(s): TSH, T4TOTAL, FREET4, T3FREE, THYROIDAB in the last 72 hours. Anemia Panel: No results for input(s): VITAMINB12, FOLATE, FERRITIN, TIBC, IRON, RETICCTPCT in the last 72 hours. Sepsis Labs: No results for input(s): PROCALCITON, LATICACIDVEN in the last 168 hours.  Recent Results (from the past 240 hour(s))  Resp Panel by RT-PCR (Flu A&B, Covid) Nasopharyngeal Swab     Status: None   Collection Time: 05/19/20 10:27 AM   Specimen: Nasopharyngeal Swab; Nasopharyngeal(NP) swabs in vial  transport medium  Result Value Ref Range Status   SARS Coronavirus 2 by RT PCR NEGATIVE NEGATIVE Final    Comment: (NOTE) SARS-CoV-2 target nucleic acids are NOT DETECTED.  The SARS-CoV-2 RNA is generally detectable in upper respiratory specimens during the acute phase of infection. The lowest concentration of SARS-CoV-2 viral copies this assay can detect is 138 copies/mL. A negative result does not preclude SARS-Cov-2 infection and should not be used  as the sole basis for treatment or other patient management decisions. A negative result may occur with  improper specimen collection/handling, submission of specimen other than nasopharyngeal swab, presence of viral mutation(s) within the areas targeted by this assay, and inadequate number of viral copies(<138 copies/mL). A negative result must be combined with clinical observations, patient history, and epidemiological information. The expected result is Negative.  Fact Sheet for Patients:  BloggerCourse.com  Fact Sheet for Healthcare Providers:  SeriousBroker.it  This test is no t yet approved or cleared by the [place] FDA and  has been authorized for detection and/or diagnosis of SARS-CoV-2 by FDA under an Emergency Use Authorization (EUA). This EUA will remain  in effect (meaning this test can be used) for the duration of the COVID-19 declaration under Section 564(b)(1) of the Act, 21 U.S.C.section 360bbb-3(b)(1), unless the authorization is terminated  or revoked sooner.       Influenza A by PCR NEGATIVE NEGATIVE Final   Influenza B by PCR NEGATIVE NEGATIVE Final    Comment: (NOTE) The Xpert Xpress SARS-CoV-2/FLU/RSV plus assay is intended as an aid in the diagnosis of influenza from Nasopharyngeal swab specimens and should not be used as a sole basis for treatment. Nasal washings and aspirates are unacceptable for Xpert Xpress SARS-CoV-2/FLU/RSV testing.  Fact  Sheet for Patients: BloggerCourse.com  Fact Sheet for Healthcare Providers: SeriousBroker.it  This test is not yet approved or cleared by the [place] FDA and has been authorized for detection and/or diagnosis of SARS-CoV-2 by FDA under an Emergency Use Authorization (EUA). This EUA will remain in effect (meaning this test can be used) for the duration of the COVID-19 declaration under Section 564(b)(1) of the Act, 21 U.S.C. section 360bbb-3(b)(1), unless the authorization is terminated or revoked.  Performed at [facility], [address] [place], [place] [address]      Radiology Studies: No results found.    Scheduled Meds: . atorvastatin  40 mg Oral QPM  . ferrous sulfate  325 mg Oral Daily  . multivitamin with minerals  1 tablet Oral Daily  . pantoprazole (PROTONIX) IV  40 mg Intravenous Q12H  . prednisoLONE acetate  1 drop Left Eye Daily  . timolol  1 drop Left Eye q morning - 10a   Continuous Infusions: . sodium chloride 75 mL/hr at 05/22/20 0554  . pantoprozole (PROTONIX) infusion 8 mg/hr (05/22/20 0131)     LOS: 2 days    Joycelyn Das, MD [facility] 05/22/2020

## 2020-05-23 ENCOUNTER — Encounter (HOSPITAL_COMMUNITY): Payer: Self-pay | Admitting: Gastroenterology

## 2020-05-23 DIAGNOSIS — E876 Hypokalemia: Secondary | ICD-10-CM | POA: Diagnosis not present

## 2020-05-23 DIAGNOSIS — I10 Essential (primary) hypertension: Secondary | ICD-10-CM | POA: Diagnosis not present

## 2020-05-23 DIAGNOSIS — K254 Chronic or unspecified gastric ulcer with hemorrhage: Secondary | ICD-10-CM | POA: Diagnosis not present

## 2020-05-23 LAB — BPAM RBC
Blood Product Expiration Date: 202201042359
Blood Product Expiration Date: 202201042359
ISSUE DATE / TIME: 202112051114
Unit Type and Rh: 5100
Unit Type and Rh: 5100

## 2020-05-23 LAB — TYPE AND SCREEN
ABO/RH(D): O POS
Antibody Screen: NEGATIVE
Unit division: 0
Unit division: 0

## 2020-05-23 LAB — HEMOGLOBIN AND HEMATOCRIT, BLOOD
HCT: 25.5 % — ABNORMAL LOW (ref 36.0–46.0)
Hemoglobin: 8 g/dL — ABNORMAL LOW (ref 12.0–15.0)

## 2020-05-23 MED ORDER — PANTOPRAZOLE SODIUM 40 MG PO TBEC: DELAYED_RELEASE_TABLET | ORAL | 0 refills | Status: DC

## 2020-05-23 NOTE — Discharge Summary (Signed)
Physician Discharge Summary  Kelsey [name] UXN:[id number] DOB: 12/23/1945 DOA: 05/19/2020  PCP: Caffie Damme, MD  Admit date: 05/19/2020 Discharge date: 05/23/2020  Admitted From: Home  Discharge disposition: Home   Recommendations for Outpatient Follow-Up:   . Follow up with your primary care provider in one week.  . Check CBC, BMP, magnesium in the next visit . Patient will need a repeat upper GI endoscopy in 2 months to ensure healing of gastric ulcer. . Patient will need PPI indefinitely which has been prescribed. . Please address the need for aspirin in the future.  This has been discontinued at this time.  Discharge Diagnosis:   Active Problems:   GIB (gastrointestinal bleeding) Gastric ulcer Hypertension Hypokalemia  Discharge Condition: Improved.  Diet recommendation: Low sodium, heart healthy.    Wound care: None.  Code status: Full.   History of Present Illness:   Patient is a 74 years old female with past medical history of hypertension, hyperlipidemia presented to the hospital with hematemesis and hematochezia with black tarry stools.  In the ED, patient had a hemoglobin of around 10 and was mildly hypotensive which responded with IV fluids.  Patient was then admitted to hospital for GI work-up.  Patient underwent EGD on 05/19/2020 with findings of nonbleeding gastric ulcer and was on placed on  Protonix drip.   Hospital Course:   Following conditions were addressed during hospitalization as listed below,  Upper GI bleeding, acute blood loss anemia: Patient underwent esophagogastroduodenoscopy on 05/19/2020 with findings of gastric ulcer which was not actively bleeding but patient was put on Protonix drip which she has completed.  This was transitioned to IV Protonix twice daily.  GI has seen the patient today and recommend p.o. Protonix for 6 to 8 weeks twice daily followed by once daily indefinitely.  Patient received  total of 2 units of packed RBC  during hospitalization.  Spoke with the patient about it.  Patient was on NSAIDs at home which will be discontinued on discharge.    I spoke with the patient's daughter about avoiding NSAIDs.  Hemoglobin of 8.0 today.  Communicated with GI prior to discharge.  Essential hypertension:  Resume amlodipine and chlorthalidone at home  Hypokalemia: Improved after replacement.  Disposition.  At this time, patient is stable for disposition home with PCP and GI follow-up.  Spoke with the patient's daughter on the phone and updated her about the plan for disposition.  Medical Consultants:    GI  Procedures:    EGD on 12/ 3/ 2021 Subjective:   Today, patient was seen and examined at bedside.  Denies any nausea, vomiting abdominal pain.  Has had lightening black stools yesterday   Discharge Exam:   Vitals:   05/22/20 2114 05/23/20 0610  BP: 95/63 127/64  Pulse: 79 60  Resp: 18 18  Temp: 98 F (36.7 C) 98.5 F (36.9 C)  SpO2: 100%    Vitals:   05/22/20 0941 05/22/20 1338 05/22/20 2114 05/23/20 0610  BP:  (!) 107/58 95/63 127/64  Pulse:  75 79 60  Resp:  18 18 18   Temp:  98.7 F (37.1 C) 98 F (36.7 C) 98.5 F (36.9 C)  TempSrc:  Oral Oral   SpO2:  99% 100%   Weight: 90.8 kg     Height:       General: Alert awake, not in obvious distress, obese HENT: pupils equally reacting to light, mild pallor noted. Oral mucosa is moist.  Chest:  Clear breath sounds.  Diminished breath sounds bilaterally. No crackles or wheezes.  CVS: S1 &S2 heard. No murmur.  Regular rate and rhythm. Abdomen: Soft, nontender, nondistended.  Bowel sounds are heard.   Extremities: No cyanosis, clubbing or edema.  Peripheral pulses are palpable. Psych: Alert, awake and oriented, normal mood CNS:  No cranial nerve deficits.  Power equal in all extremities.   Skin: Warm and dry.  No rashes noted.  The results of significant diagnostics from this hospitalization (including imaging, microbiology, ancillary  and laboratory) are listed below for reference.     Diagnostic Studies:   DG Abdomen Acute W/Chest  Result Date: 05/19/2020 CLINICAL DATA:  Epigastric pain.  Vomiting. EXAM: DG ABDOMEN ACUTE WITH 1 VIEW CHEST COMPARISON:  12/20/2013 CT abdomen pelvis. FINDINGS: No focal consolidation, pneumothorax or pleural effusion. Cardiomediastinal silhouette within normal limits. Aortic atherosclerotic calcifications. Left chest pacing device with leads terminating over the right heart. Multilevel spondylosis. Nonobstructive bowel gas pattern. Mild colorectal stool burden. Left hemipelvic dystrophic calcifications, unchanged. No suspicious calcific densities. No acute osseous abnormality. IMPRESSION: 1. No acute cardiopulmonary disease. 2. Nonobstructive bowel gas pattern. Mild colorectal stool burden. Electronically Signed   By: Stana Bunting M.D.   On: 05/19/2020 11:34     Labs:   Basic Metabolic Panel: Recent Labs  Lab 05/19/20 1028 05/19/20 1028 05/19/20 1054 05/19/20 1054 05/20/20 0549 05/22/20 0430  NA 140  --  140  --  139 143  K 3.5   < > 3.7   < > 3.2* 4.1  CL 103  --  102  --  103 111  CO2 25  --   --   --  26 25  GLUCOSE 151*  --  141*  --  106* 101*  BUN 42*  --  41*  --  26* 6*  CREATININE 0.94  --  0.90  --  0.73 0.70  CALCIUM 9.5  --   --   --  8.6* 9.1   < > = values in this interval not displayed.   GFR Estimated Creatinine Clearance: 69.3 mL/min (by C-G formula based on SCr of 0.7 mg/dL). Liver Function Tests: Recent Labs  Lab 05/19/20 1028  AST 25  ALT 23  ALKPHOS 80  BILITOT 0.9  PROT 6.5  ALBUMIN 3.9   No results for input(s): LIPASE, AMYLASE in the last 168 hours. No results for input(s): AMMONIA in the last 168 hours. Coagulation profile No results for input(s): INR, PROTIME in the last 168 hours.  CBC: Recent Labs  Lab 05/19/20 1028 05/19/20 1054 05/20/20 0549 05/20/20 1634 05/21/20 0413 05/21/20 1801 05/22/20 0430 05/22/20 1636  05/23/20 0445  WBC 10.5  --  11.0*  --   --   --  8.8  --   --   NEUTROABS  --   --  6.5  --   --   --   --   --   --   HGB 10.5*   < > 7.6*   < > 6.1* 8.9* 8.4* 9.0* 8.0*  HCT 32.6*   < > 24.1*   < > 19.6* 27.7* 26.2* 28.7* 25.5*  MCV 90.8  --  93.1  --   --   --  93.2  --   --   PLT 258  --  192  --   --   --  189  --   --    < > = values in this interval not displayed.   Cardiac Enzymes: No results for  input(s): CKTOTAL, CKMB, CKMBINDEX, TROPONINI in the last 168 hours. BNP: Invalid input(s): POCBNP CBG: No results for input(s): GLUCAP in the last 168 hours. D-Dimer No results for input(s): DDIMER in the last 72 hours. Hgb A1c No results for input(s): HGBA1C in the last 72 hours. Lipid Profile No results for input(s): CHOL, HDL, LDLCALC, TRIG, CHOLHDL, LDLDIRECT in the last 72 hours. Thyroid function studies No results for input(s): TSH, T4TOTAL, T3FREE, THYROIDAB in the last 72 hours.  Invalid input(s): FREET3 Anemia work up No results for input(s): VITAMINB12, FOLATE, FERRITIN, TIBC, IRON, RETICCTPCT in the last 72 hours. Microbiology Recent Results (from the past 240 hour(s))  Resp Panel by RT-PCR (Flu A&B, Covid) Nasopharyngeal Swab     Status: None   Collection Time: 05/19/20 10:27 AM   Specimen: Nasopharyngeal Swab; Nasopharyngeal(NP) swabs in vial transport medium  Result Value Ref Range Status   SARS Coronavirus 2 by RT PCR NEGATIVE NEGATIVE Final    Comment: (NOTE) SARS-CoV-2 target nucleic acids are NOT DETECTED.  The SARS-CoV-2 RNA is generally detectable in upper respiratory specimens during the acute phase of infection. The lowest concentration of SARS-CoV-2 viral copies this assay can detect is 138 copies/mL. A negative result does not preclude SARS-Cov-2 infection and should not be used as the sole basis for treatment or other patient management decisions. A negative result may occur with  improper specimen collection/handling, submission of specimen  other than nasopharyngeal swab, presence of viral mutation(s) within the areas targeted by this assay, and inadequate number of viral copies(<138 copies/mL). A negative result must be combined with clinical observations, patient history, and epidemiological information. The expected result is Negative.  Fact Sheet for Patients:  BloggerCourse.com  Fact Sheet for Healthcare Providers:  SeriousBroker.it  This test is no t yet approved or cleared by the [place] FDA and  has been authorized for detection and/or diagnosis of SARS-CoV-2 by FDA under an Emergency Use Authorization (EUA). This EUA will remain  in effect (meaning this test can be used) for the duration of the COVID-19 declaration under Section 564(b)(1) of the Act, 21 U.S.C.section 360bbb-3(b)(1), unless the authorization is terminated  or revoked sooner.       Influenza A by PCR NEGATIVE NEGATIVE Final   Influenza B by PCR NEGATIVE NEGATIVE Final    Comment: (NOTE) The Xpert Xpress SARS-CoV-2/FLU/RSV plus assay is intended as an aid in the diagnosis of influenza from Nasopharyngeal swab specimens and should not be used as a sole basis for treatment. Nasal washings and aspirates are unacceptable for Xpert Xpress SARS-CoV-2/FLU/RSV testing.  Fact Sheet for Patients: BloggerCourse.com  Fact Sheet for Healthcare Providers: SeriousBroker.it  This test is not yet approved or cleared by the [place] FDA and has been authorized for detection and/or diagnosis of SARS-CoV-2 by FDA under an Emergency Use Authorization (EUA). This EUA will remain in effect (meaning this test can be used) for the duration of the COVID-19 declaration under Section 564(b)(1) of the Act, 21 U.S.C. section 360bbb-3(b)(1), unless the authorization is terminated or revoked.  Performed at [facility], [address] [place], [place] [address]      Discharge Instructions:   Discharge Instructions    Call MD for:   Complete by: As directed    Bright red bleeding per rectum.   Call MD for:  severe uncontrolled pain   Complete by: As directed    Diet - low sodium heart healthy   Complete by: As directed  Diet general   Complete by: As directed    Discharge instructions   Complete by: As directed    Follow-up with your primary care provider in 1 week to check your blood work.  [facility].  You might need endoscopy at that time.  Continue to take Protonix twice a day for 6 to 8 weeks followed by once a day indefinitely.  Your aspirin has been discontinued at this time.  Talk with your family doctor about it.  Do not take NSAIDs Motrin okay to take Tylenol for pain.   Increase activity slowly   Complete by: As directed     Allergies as of 05/23/2020      Reactions   Bee Venom Anaphylaxis   ALL BEE STINGS   Oxycodone-acetaminophen Itching      Medication List    STOP taking these medications   aspirin EC 81 MG tablet   naproxen sodium 220 MG tablet Commonly known as: ALEVE   potassium chloride SA 20 MEQ tablet Commonly known as: Klor-Con M20     TAKE these medications   amLODipine 10 MG tablet Commonly known as: NORVASC Take 10 mg by mouth daily.   atorvastatin 40 MG tablet Commonly known as: LIPITOR Take 40 mg by mouth every evening.   CALCIUM + D3 PO Take 1 capsule by mouth daily.   chlorthalidone 25 MG tablet Commonly known as: HYGROTON TAKE 1 TABLET BY MOUTH EVERY DAY   docusate sodium 100 MG capsule Commonly known as: Colace Take 1 capsule (100 mg total) by mouth 2 (two) times daily. While taking narcotic pain medicine.   EpiPen 2-Pak 0.3 mg/0.3 mL Soaj injection Generic drug: EPINEPHrine Inject 0.3 mg into the muscle as needed for anaphylaxis.   ferrous sulfate 325 (65 FE) MG EC tablet Take 1 tablet by mouth daily.    HYDROcodone-acetaminophen 10-325 MG tablet Commonly known as: NORCO Take 1 tablet by mouth 2 (two) times daily as needed for severe pain.   multivitamin with minerals tablet Take 1 tablet by mouth daily.   pantoprazole 40 MG tablet Commonly known as: Protonix Take 1 tablet (40 mg total) by mouth 2 (two) times daily for 60 days, THEN 1 tablet (40 mg total) daily. Start taking on: May 23, 2020   prednisoLONE acetate 1 % ophthalmic suspension Commonly known as: PRED FORTE Place 1 drop into the left eye daily.   senna 8.6 MG Tabs tablet Commonly known as: SENOKOT Take 2 tablets (17.2 mg total) by mouth 2 (two) times daily.   timolol 0.5 % ophthalmic solution Commonly known as: TIMOPTIC Place 1 drop into the left eye every morning.            Follow-up Information    Caffie DammeSmith, Karla, MD. Schedule an appointment as soon as possible for a visit in 1 week(s).   Specialty: Family Medicine Why: Blood work and regular checkup Contact information: [address] [place] [address] 707 [phone]        Pricilla Riffleoss, Paula V, MD .   Specialty: Cardiology Contact information: [address] [place] [address] [phone]                Time coordinating discharge: 39 minutes  Signed:  Kamalani Mastro  [facility] 05/23/2020, 11:14 AM

## 2020-05-23 NOTE — Progress Notes (Addendum)
[facility] Gastroenterology Progress Note  Kelsey [name] 74 y.o. [birth year]  CC:  Upper GI bleeding  Subjective: Patient reports feeling well this morning. She denies any complaints.  She denies abdominal pain, nausea, vomiting, hematemesis. She states she had a BM last night which was dark but not black, states bowel movements are not as dark.  ROS : Review of Systems  Respiratory: Negative for shortness of breath.   Cardiovascular: Negative for chest pain and palpitations.  Gastrointestinal: Negative for abdominal pain, blood in stool, constipation, diarrhea, heartburn, melena, nausea and vomiting.    Objective: Vital signs in last 24 hours: Vitals:   05/22/20 2114 05/23/20 0610  BP: 95/63 127/64  Pulse: 79 60  Resp: 18 18  Temp: 98 F (36.7 C) 98.5 F (36.9 C)  SpO2: 100%     Physical Exam:  General:  Alert, oriented, cooperative, no distress  Head:  Normocephalic, without obvious abnormality, atraumatic  Eyes:  Mild conjunctival pallor, EOMs intact  Lungs:   Clear to auscultation bilaterally, respirations unlabored  Heart:  Regular rate and rhythm, S1, S2 normal, +murmur  Abdomen:   Soft, non-tender, bowel sounds active all four quadrants,  no guarding or peritoneal signs  Extremities: Extremities normal, atraumatic, no  edema  Pulses: 2+ and symmetric    Lab Results: Recent Labs    05/22/20 0430  NA 143  K 4.1  CL 111  CO2 25  GLUCOSE 101*  BUN 6*  CREATININE 0.70  CALCIUM 9.1   No results for input(s): AST, ALT, ALKPHOS, BILITOT, PROT, ALBUMIN in the last 72 hours. Recent Labs    05/22/20 0430 05/22/20 0430 05/22/20 1636 05/23/20 0445  WBC 8.8  --   --   --   HGB 8.4*   < > 9.0* 8.0*  HCT 26.2*   < > 28.7* 25.5*  MCV 93.2  --   --   --   PLT 189  --   --   --    < > = values in this interval not displayed.   No results for input(s): LABPROT, INR in the last 72 hours.    Assessment: Upper GI Bleeding, resolved: Non-bleeding gastric ulcer  with no stigmata of bleeding seen on EGD 12/3.   -Hgb 8.0 today, mildly decreased from yesterday, though Hgb has been fluctuating between 8 and 9. No further signs of bleeding. -BUN decreased, now normal (6) as of 12/6 compared to 42 on arrival, suggesting resolution of upper GI bleeding  ?Small amt of red blood mixed in melenic stool, now resolved: last colonoscopy 08/2019 at [facility]: internal hemorrhoids, diverticulosis, several polyps, otherwise unremarkable  Plan: OK to discharge from a GI standpoint.  Continue Protonix 40 mg PO BID for 6-8 weeks, followed by Protonix 40 mg once daily indefinitely, especially if patient remains on 81 mg ASA.  Discuss need for 81 mg ASA with PCP.  Patient advised to follow up with primary GI [facility]) for repeat EGD in 1-2 months to document healing of ulcer.  Recommend repeat CBC with PCP or [facility] in 1-2 weeks.  Educated patient on high fiber diet due to diverticulosis and internal hemorrhoids.  [facility] will sign off. Please contact us if we can be of any further assistance during this hospital stay.  Edrick Kins PA-C 05/23/2020, 9:19 AM  Contact #  [phone]

## 2020-05-23 NOTE — Care Management Important Message (Signed)
Important Message  Patient Details IM Letter given to the Patient. Name: AZURE BUDNICK MRN: [id number] Date of Birth: 10-14-1945   Medicare Important Message Given:  Yes     Caren Macadam 05/23/2020, 1:40 PM

## 2020-05-23 NOTE — Progress Notes (Signed)
Discharged to home, instructions reviewed with patient. Pt acknowledged understanding.Joellyn Quails, RN

## 2020-05-23 NOTE — Plan of Care (Signed)
  Problem: Education: Goal: Knowledge of General Education information will improve Description: Including pain rating scale, medication(s)/side effects and non-pharmacologic comfort measures 05/23/2020 1358 by Charmian Muff, RN Outcome: Adequate for Discharge 05/23/2020 1358 by Charmian Muff, RN Outcome: Progressing   Problem: Clinical Measurements: Goal: Ability to maintain clinical measurements within normal limits will improve Outcome: Adequate for Discharge Goal: Will remain free from infection Outcome: Adequate for Discharge Goal: Diagnostic test results will improve Outcome: Adequate for Discharge Goal: Respiratory complications will improve Outcome: Adequate for Discharge Goal: Cardiovascular complication will be avoided Outcome: Adequate for Discharge   Problem: Health Behavior/Discharge Planning: Goal: Ability to manage health-related needs will improve 05/23/2020 1358 by Charmian Muff, RN Outcome: Adequate for Discharge 05/23/2020 1358 by Charmian Muff, RN Outcome: Progressing

## 2020-05-23 NOTE — Plan of Care (Signed)
  Problem: Education: Goal: Knowledge of General Education information will improve Description Including pain rating scale, medication(s)/side effects and non-pharmacologic comfort measures Outcome: Progressing   Problem: Health Behavior/Discharge Planning: Goal: Ability to manage health-related needs will improve Outcome: Progressing   

## 2020-07-13 ENCOUNTER — Ambulatory Visit (INDEPENDENT_AMBULATORY_CARE_PROVIDER_SITE_OTHER): Payer: Medicare HMO

## 2020-07-13 DIAGNOSIS — I442 Atrioventricular block, complete: Secondary | ICD-10-CM

## 2020-07-13 LAB — CUP PACEART REMOTE DEVICE CHECK
Battery Remaining Longevity: 161 mo
Battery Voltage: 3.03 V
Brady Statistic AP VP Percent: 0.06 %
Brady Statistic AP VS Percent: 5.03 %
Brady Statistic AS VP Percent: 0.2 %
Brady Statistic AS VS Percent: 94.7 %
Brady Statistic RA Percent Paced: 5.08 %
Brady Statistic RV Percent Paced: 0.27 %
Date Time Interrogation Session: 20220127020912
Implantable Lead Implant Date: 20200129
Implantable Lead Implant Date: 20200129
Implantable Lead Location: 753859
Implantable Lead Location: 753860
Implantable Lead Model: 3830
Implantable Lead Model: 5076
Implantable Pulse Generator Implant Date: 20200129
Lead Channel Impedance Value: 323 Ohm
Lead Channel Impedance Value: 323 Ohm
Lead Channel Impedance Value: 475 Ohm
Lead Channel Impedance Value: 513 Ohm
Lead Channel Pacing Threshold Amplitude: 0.875 V
Lead Channel Pacing Threshold Amplitude: 1 V
Lead Channel Pacing Threshold Pulse Width: 0.4 ms
Lead Channel Pacing Threshold Pulse Width: 0.4 ms
Lead Channel Sensing Intrinsic Amplitude: 13.375 mV
Lead Channel Sensing Intrinsic Amplitude: 13.375 mV
Lead Channel Sensing Intrinsic Amplitude: 2.5 mV
Lead Channel Sensing Intrinsic Amplitude: 2.5 mV
Lead Channel Setting Pacing Amplitude: 2 V
Lead Channel Setting Pacing Amplitude: 2 V
Lead Channel Setting Pacing Pulse Width: 0.4 ms
Lead Channel Setting Sensing Sensitivity: 1.2 mV

## 2020-07-22 NOTE — Progress Notes (Signed)
Remote pacemaker transmission.   

## 2020-09-12 ENCOUNTER — Telehealth: Payer: Self-pay | Admitting: *Deleted

## 2020-09-12 NOTE — Telephone Encounter (Signed)
   [facility] Pre-operative Risk Assessment    HEARTCARE STAFF: - Please ensure there is not already an duplicate clearance open for this procedure. - Under Visit Info/Reason for Call, type in Other and utilize the format Clearance MM/DD/YY or Clearance TBD. Do not use dashes or single digits. - If request is for dental extraction, please clarify the # of teeth to be extracted.  Request for surgical clearance:  1. What type of surgery is being performed? EGD/Colonoscopy   2. When is this surgery scheduled? TBD   3. What type of clearance is required (medical clearance vs. Pharmacy clearance to hold med vs. Both)? Medical  4. Are there any medications that need to be held prior to surgery and how long?None listed   5. Practice name and name of physician performing surgery? [facility]   6. What is the office phone number? [phone]   7.   What is the office fax number? [phone]  8.   Anesthesia type (None, local, MAC, general) ? Not listed   Juventino Slovak 09/12/2020, 5:02 PM  _________________________________________________________________   (provider comments below)

## 2020-09-13 NOTE — Telephone Encounter (Signed)
Pt agreeable to pre op appt needed for clearance. Ok per [facility], PAC to use 72 hour time slot for pre op. Pt is thankful for the help. I will add pt to wait list as well for possible sooner appt. Will send notes to [facility] for upcoming appt. Will send FYI to requesting office pt has appt.

## 2020-09-13 NOTE — Telephone Encounter (Signed)
Informed pt that she needs appt for cardiac clearance she will await call from scheduling

## 2020-09-13 NOTE — Telephone Encounter (Signed)
Primary Cardiologist:Paula Tenny Craw, MD  Chart reviewed as part of pre-operative protocol coverage. Because of Kelsey [name] past medical history and time since last visit, he/she will require a follow-up visit in order to better assess preoperative cardiovascular risk.  Pre-op covering staff: - Please schedule appointment and call patient to inform them. - Please contact requesting surgeon's office via preferred method (i.e, phone, fax) to inform them of need for appointment prior to surgery.  If applicable, this message will also be routed to pharmacy pool and/or primary cardiologist for input on holding anticoagulant/antiplatelet agent as requested below so that this information is available at time of patient's appointment.   Ronney Asters, NP  09/13/2020, 10:44 AM

## 2020-09-15 NOTE — Telephone Encounter (Signed)
ADDENDUM: [facility] sent over amendment/clarification to pre op clearance request.   PROCEDURE: EGD CARDIAC CLEARANCE NEEDED ANESTHESIA: MAC  Pt has appt with Richardson Dopp, PAC. I will send over updated notes to [facility] for upcoming appt.

## 2020-10-10 NOTE — Progress Notes (Signed)
Cardiology Office Note:    Date:  10/11/2020   ID:  Kelsey [name] September 08, 1945, MRN [id number]  PCP:  Caffie Damme, MD   [facility]  Cardiologist:  Dietrich Pates, MD   Electrophysiologist:  None       Referring MD: Caffie Damme, MD   Chief Complaint:  Surgical Clearance    Patient Profile:    Kelsey [name] is a 75 y.o. female with:    CHB s/p pacemaker  Non-sustained atrial tachycardia   Hx of CVA in 2019  Hypertension   Hyperlipidemia   Dynamic LVOT obstruction   Aortic stenosis   Echocardiogram 2/19: mild to mod AS (mean 20 mmHg)  Upper GI bleed 05/2020 s/p transfusion with PRBCs x 2   Prior CV studies: Echocardiogram 05/31/18 EF 55-60, normal wall motion, Gr 1 DD, mild AI, mean AV gradient 23 mmHg  GATED SPECT MYO PERF W/LEXISCAN STRESS 1D 08/01/2017 Narrative  Nuclear stress EF: 53%.  No T wave inversion was noted during stress.  There was no ST segment deviation noted during stress.  Defect 1: There is a medium defect of moderate severity.  This is a low risk study. Medium size, moderate severity fixed inferoseptal/septal bowel attenuation artifact. No reversible ischemia. LVEF 53% with normal wall motion. This is a low risk study.    NON-TELEMETRY MONITORING HOOKUP AND INTERP 05/27/2018 Narrative Sinus rhythm   Transient complete heart block with 3.5 second pause   VAS US CAROTID DUPLEX BILATERAL 06/01/2018 Summary: Right Carotid: Velocities in the right ICA are consistent with a 1-39% stenosis. Left Carotid: Velocities in the left ICA are consistent with a 1-39% stenosis  Echocardiogram 08/01/17 - Normal LV size with mild LV hypertrophy. EF 60-65%. Normal RV  size and systolic function. Mild to moderate aortic stenosis  (mean gradient 20 mmHg, AVA 1.87 cm^2).   History of Present Illness: Ms. Kelsey [name] returns for surgical clearance.  She needs an EGD and colonoscopy.  She is here alone.  Overall, she  has been doing well.  She has not had chest discomfort, significant shortness of breath, orthopnea, leg edema or syncope.  She has knee arthritis which limits her mobility.  However, she does go for walks most days.  She is able to do most housework without any issues.        Past Medical History:  Diagnosis Date  . Arthritis    OA  . Heart murmur    refer to cardiologist note from dr Dietrich Pates  . Hyperlipidemia   . Hypertension   . Left ureteral calculus   . 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 Wears dentures    upper  and partial lower  . Wears glasses     Current Medications: Current Meds  Medication Sig  . amLODipine (NORVASC) 10 MG tablet Take 10 mg by mouth daily.  Marland Kitchen atorvastatin (LIPITOR) 40 MG tablet Take 40 mg by mouth every evening.  . Calcium Carb-Cholecalciferol (CALCIUM + D3 PO) Take 1 capsule by mouth daily.  . chlorthalidone (HYGROTON) 25 MG tablet TAKE 1 TABLET BY MOUTH EVERY DAY  . EPINEPHrine 0.3 mg/0.3 mL IJ SOAJ injection Inject 0.3 mg into the muscle as needed for anaphylaxis.  . ferrous sulfate 325 (65 FE) MG EC tablet Take 1 tablet by mouth daily.  Marland Kitchen HYDROcodone-acetaminophen (NORCO) 10-325 MG tablet Take 1 tablet by mouth 2 (two) times daily as needed for severe pain.   . Metoprolol Succinate  25 MG CS24 Take 25 mg by mouth daily.  . Multiple Vitamins-Minerals (MULTIVITAMIN WITH MINERALS) tablet Take 1 tablet by mouth daily.  . prednisoLONE acetate (PRED FORTE) 1 % ophthalmic suspension Place 1 drop into the left eye daily.  . timolol (TIMOPTIC) 0.5 % ophthalmic solution Place 1 drop into the left eye every morning.     Allergies:   Bee venom and Oxycodone-acetaminophen   Social History   Tobacco Use  . Smoking status: Current Some Day Smoker    Packs/day: 0.50    Types: Cigarettes  . Smokeless tobacco: Never Used  . Tobacco comment: per pt occasional smoker  Vaping Use  . Vaping Use: Never used  Substance Use Topics  .  Alcohol use: Yes    Alcohol/week: 3.0 standard drinks    Types: 3 Glasses of wine per week    Comment: social  . Drug use: No     Family Hx: The patient's family history includes Hypertension in her brother and father.  ROS - See HPI  EKGs/Labs/Other Test Reviewed:    EKG:  EKG is   ordered today.  The ekg ordered today demonstrates NSR, HR 63, normal axis, no ST-T wave changes, QTC 407  Recent Labs: 05/19/2020: ALT 23 05/22/2020: BUN 6; Creatinine, Ser 0.70; Platelets 189; Potassium 4.1; Sodium 143 05/23/2020: Hemoglobin 8.0   Recent Lipid Panel Lab Results  Component Value Date/Time   CHOL 168 05/31/2018 06:52 AM   CHOL 140 07/24/2017 01:07 PM   TRIG 69 05/31/2018 06:52 AM   HDL 73 05/31/2018 06:52 AM   HDL 63 07/24/2017 01:07 PM   CHOLHDL 2.3 05/31/2018 06:52 AM   LDLCALC 81 05/31/2018 06:52 AM   LDLCALC 62 07/24/2017 01:07 PM   LDLDIRECT 141.4 04/12/2013 10:30 AM      Risk Assessment/Calculations:      Physical Exam:    VS:  BP 100/62   Pulse 63   Ht 5\' 4"  (1.626 m)   Wt 188 lb 12.8 oz (85.6 kg)   SpO2 92%   BMI 32.41 kg/m     Wt Readings from Last 3 Encounters:  10/11/20 188 lb 12.8 oz (85.6 kg)  05/22/20 200 lb 1.6 oz (90.8 kg)  02/10/20 210 lb 15.7 oz (95.7 kg)     Constitutional:      Appearance: Healthy appearance. Not in distress.  Neck:     Vascular: No JVR. JVD normal.  Pulmonary:     Effort: Pulmonary effort is normal.     Breath sounds: No wheezing. No rales.  Cardiovascular:     Normal rate. Regular rhythm. Normal S1. Normal S2.     Murmurs: There is a grade 3/6 harsh crescendo-decrescendo systolic murmur at the URSB.  Edema:    Peripheral edema absent.  Abdominal:     Palpations: Abdomen is soft. There is no hepatomegaly.  Skin:    General: Skin is warm and dry.  Neurological:     Mental Status: Alert and oriented to person, place and time.     Cranial Nerves: Cranial nerves are intact.          ASSESSMENT & PLAN:    1.  Preoperative cardiovascular examination   Kelsey [name]'s perioperative risk of a major cardiac event is 0.9% according to the Revised Cardiac Risk Index (RCRI).  Therefore, she is at low risk for perioperative complications.   Her functional capacity is good at 4.73 METs according to the Duke Activity Status Index (DASI). Recommendations: According to ACC/AHA guidelines,  no further cardiovascular testing needed.  The patient may proceed to surgery at acceptable risk.   Antiplatelet and/or Anticoagulation Recommendations: Pt is currently off of ASA.  But, Aspirin can be held for 7 days prior to her surgery.  Please resume Aspirin post operatively when it is felt to be safe from a bleeding standpoint.   2. Nonrheumatic aortic valve stenosis Mild to moderate aortic stenosis by echocardiogram in 2019.  She does not have any symptoms to suggest worsening.  As it has been more than 2 years since her last study, I will arrange a routine follow-up echocardiogram.  3. Complete heart block (HCC) 4. Cardiac pacemaker in situ F/u with EP as planned.   5. Essential hypertension The patient's blood pressure is controlled on her current regimen.  Continue current therapy.    6. Mixed hyperlipidemia Managed by PCP.    Dispo:  Return in about 1 year (around 10/11/2021) for Routine follow up 1 year for Dr.Ross. .   Medication Adjustments/Labs and Tests Ordered: Current medicines are reviewed at length with the patient today.  Concerns regarding medicines are outlined above.  Tests Ordered: Orders Placed This Encounter  Procedures  . EKG 12-Lead  . ECHOCARDIOGRAM COMPLETE   Medication Changes: No orders of the defined types were placed in this encounter.   Signed, Tereso Newcomer, PA-C  10/11/2020 5:01 PM    [facility] [address] [place], [place], [place]  [address] Phone: [phone]; Fax: [phone]

## 2020-10-11 ENCOUNTER — Other Ambulatory Visit: Payer: Self-pay

## 2020-10-11 ENCOUNTER — Ambulatory Visit: Payer: Medicare HMO | Admitting: Physician Assistant

## 2020-10-11 ENCOUNTER — Encounter: Payer: Self-pay | Admitting: Physician Assistant

## 2020-10-11 VITALS — BP 100/62 | HR 63 | Ht 64.0 in | Wt 188.8 lb

## 2020-10-11 DIAGNOSIS — I350 Nonrheumatic aortic (valve) stenosis: Secondary | ICD-10-CM

## 2020-10-11 DIAGNOSIS — I442 Atrioventricular block, complete: Secondary | ICD-10-CM | POA: Diagnosis not present

## 2020-10-11 DIAGNOSIS — E782 Mixed hyperlipidemia: Secondary | ICD-10-CM

## 2020-10-11 DIAGNOSIS — Z01810 Encounter for preprocedural cardiovascular examination: Secondary | ICD-10-CM | POA: Diagnosis not present

## 2020-10-11 DIAGNOSIS — Z950 Presence of cardiac pacemaker: Secondary | ICD-10-CM

## 2020-10-11 DIAGNOSIS — I10 Essential (primary) hypertension: Secondary | ICD-10-CM

## 2020-10-11 NOTE — Telephone Encounter (Signed)
Note faxed to surgeon's office today. Tereso Newcomer, PA-C    10/11/2020 5:17 PM

## 2020-10-11 NOTE — Patient Instructions (Addendum)
Medication Instructions:  Your physician recommends that you continue on your current medications as directed. Please refer to the Current Medication list given to you today.   *If you need a refill on your cardiac medications before your next appointment, please call your pharmacy*   Lab Work: NONE  If you have labs (blood work) drawn today and your tests are completely normal, you will receive your results only by: Marland Kitchen MyChart Message (if you have MyChart) OR . A paper copy in the mail If you have any lab test that is abnormal or we need to change your treatment, we will call you to review the results.   Testing/Procedures: Your physician has requested that you have an echocardiogram.THURSDAY, MAY 26 @ 11:15 Echocardiography is a painless test that uses sound waves to create images of your heart. It provides your doctor with information about the size and shape of your heart and how well your heart's chambers and valves are working. This procedure takes approximately one hour. There are no restrictions for this procedure.   Echocardiogram An echocardiogram is a test that uses sound waves (ultrasound) to produce images of the heart. Images from an echocardiogram can provide important information about:  Heart size and shape.  The size and thickness and movement of your heart's walls.  Heart muscle function and strength.  Heart valve function or if you have stenosis. Stenosis is when the heart valves are too narrow.  If blood is flowing backward through the heart valves (regurgitation).  A tumor or infectious growth around the heart valves.  Areas of heart muscle that are not working well because of poor blood flow or injury from a heart attack.  Aneurysm detection. An aneurysm is a weak or damaged part of an artery wall. The wall bulges out from the normal force of blood pumping through the body. Tell a health care provider about:  Any allergies you have.  All medicines you  are taking, including vitamins, herbs, eye drops, creams, and over-the-counter medicines.  Any blood disorders you have.  Any surgeries you have had.  Any medical conditions you have.  Whether you are pregnant or may be pregnant. What are the risks? Generally, this is a safe test. However, problems may occur, including an allergic reaction to dye (contrast) that may be used during the test. What happens before the test? No specific preparation is needed. You may eat and drink normally. What happens during the test?  You will take off your clothes from the waist up and put on a hospital gown.  Electrodes or electrocardiogram (ECG)patches may be placed on your chest. The electrodes or patches are then connected to a device that monitors your heart rate and rhythm.  You will lie down on a table for an ultrasound exam. A gel will be applied to your chest to help sound waves pass through your skin.  A handheld device, called a transducer, will be pressed against your chest and moved over your heart. The transducer produces sound waves that travel to your heart and bounce back (or "echo" back) to the transducer. These sound waves will be captured in real-time and changed into images of your heart that can be viewed on a video monitor. The images will be recorded on a computer and reviewed by your health care provider.  You may be asked to change positions or hold your breath for a short time. This makes it easier to get different views or better views of your heart.  In some cases, you may receive contrast through an IV in one of your veins. This can improve the quality of the pictures from your heart. The procedure may vary among health care providers and hospitals.   What can I expect after the test? You may return to your normal, everyday life, including diet, activities, and medicines, unless your health care provider tells you not to do that. Follow these instructions at home:  It is up  to you to get the results of your test. Ask your health care provider, or the department that is doing the test, when your results will be ready.  Keep all follow-up visits. This is important. Summary  An echocardiogram is a test that uses sound waves (ultrasound) to produce images of the heart.  Images from an echocardiogram can provide important information about the size and shape of your heart, heart muscle function, heart valve function, and other possible heart problems.  You do not need to do anything to prepare before this test. You may eat and drink normally.  After the echocardiogram is completed, you may return to your normal, everyday life, unless your health care provider tells you not to do that. This information is not intended to replace advice given to you by your health care provider. Make sure you discuss any questions you have with your health care provider. Document Revised: 01/25/2020 Document Reviewed: 01/25/2020 Elsevier Patient Education  2021 Elsevier Inc.    Follow-Up: At [facility], you and your health needs are our priority.  As part of our continuing mission to provide you with exceptional heart care, we have created designated Provider Care Teams.  These Care Teams include your primary Cardiologist (physician) and Advanced Practice Providers (APPs -  Physician Assistants and Nurse Practitioners) who all work together to provide you with the care you need, when you need it.  We recommend signing up for the patient portal called "MyChart".  Sign up information is provided on this After Visit Summary.  MyChart is used to connect with patients for Virtual Visits (Telemedicine).  Patients are able to view lab/test results, encounter notes, upcoming appointments, etc.  Non-urgent messages can be sent to your provider as well.   To learn more about what you can do with MyChart, go to [email or web address].    Your next appointment:   1 year(s)  The format for  your next appointment:   In Person  Provider:   Dietrich Pates, MD   Other Instructions Your physician wants you to follow-up in: 1 year with Dr.Ross.  You will receive a reminder letter in the mail two months in advance. If you don't receive a letter, please call our office to schedule the follow-up appointment.

## 2020-10-12 LAB — CUP PACEART REMOTE DEVICE CHECK
Battery Remaining Longevity: 158 mo
Battery Voltage: 3.03 V
Brady Statistic AP VP Percent: 0.06 %
Brady Statistic AP VS Percent: 4.84 %
Brady Statistic AS VP Percent: 0.08 %
Brady Statistic AS VS Percent: 95.01 %
Brady Statistic RA Percent Paced: 4.94 %
Brady Statistic RV Percent Paced: 0.14 %
Date Time Interrogation Session: 20220428071630
Implantable Lead Implant Date: 20200129
Implantable Lead Implant Date: 20200129
Implantable Lead Location: 753859
Implantable Lead Location: 753860
Implantable Lead Model: 3830
Implantable Lead Model: 5076
Implantable Pulse Generator Implant Date: 20200129
Lead Channel Impedance Value: 323 Ohm
Lead Channel Impedance Value: 323 Ohm
Lead Channel Impedance Value: 418 Ohm
Lead Channel Impedance Value: 475 Ohm
Lead Channel Pacing Threshold Amplitude: 0.625 V
Lead Channel Pacing Threshold Amplitude: 0.625 V
Lead Channel Pacing Threshold Pulse Width: 0.4 ms
Lead Channel Pacing Threshold Pulse Width: 0.4 ms
Lead Channel Sensing Intrinsic Amplitude: 13.875 mV
Lead Channel Sensing Intrinsic Amplitude: 13.875 mV
Lead Channel Sensing Intrinsic Amplitude: 2 mV
Lead Channel Sensing Intrinsic Amplitude: 2 mV
Lead Channel Setting Pacing Amplitude: 1.5 V
Lead Channel Setting Pacing Amplitude: 2 V
Lead Channel Setting Pacing Pulse Width: 0.4 ms
Lead Channel Setting Sensing Sensitivity: 1.2 mV

## 2020-11-09 ENCOUNTER — Other Ambulatory Visit (HOSPITAL_COMMUNITY): Payer: Medicare HMO

## 2020-11-14 ENCOUNTER — Encounter: Payer: Medicare HMO | Admitting: Internal Medicine

## 2020-12-12 ENCOUNTER — Other Ambulatory Visit (HOSPITAL_COMMUNITY): Payer: Medicare HMO

## 2020-12-26 ENCOUNTER — Other Ambulatory Visit: Payer: Self-pay

## 2020-12-26 ENCOUNTER — Ambulatory Visit (HOSPITAL_COMMUNITY): Payer: Medicare HMO | Attending: Cardiovascular Disease

## 2020-12-26 DIAGNOSIS — I442 Atrioventricular block, complete: Secondary | ICD-10-CM | POA: Diagnosis not present

## 2020-12-26 DIAGNOSIS — E782 Mixed hyperlipidemia: Secondary | ICD-10-CM | POA: Diagnosis present

## 2020-12-26 DIAGNOSIS — Z01810 Encounter for preprocedural cardiovascular examination: Secondary | ICD-10-CM | POA: Diagnosis present

## 2020-12-26 DIAGNOSIS — I10 Essential (primary) hypertension: Secondary | ICD-10-CM | POA: Diagnosis present

## 2020-12-26 DIAGNOSIS — I350 Nonrheumatic aortic (valve) stenosis: Secondary | ICD-10-CM

## 2020-12-26 DIAGNOSIS — Z950 Presence of cardiac pacemaker: Secondary | ICD-10-CM

## 2020-12-27 ENCOUNTER — Encounter: Payer: Self-pay | Admitting: Physician Assistant

## 2020-12-27 LAB — ECHOCARDIOGRAM COMPLETE
AR max vel: 1.22 cm2
AV Area VTI: 1.4 cm2
AV Area mean vel: 1.38 cm2
AV Mean grad: 24 mmHg
AV Peak grad: 48.7 mmHg
Ao pk vel: 3.49 m/s
Area-P 1/2: 2.42 cm2
P 1/2 time: 478 msec
S' Lateral: 2.4 cm

## 2021-01-01 ENCOUNTER — Other Ambulatory Visit: Payer: Self-pay | Admitting: *Deleted

## 2021-01-01 DIAGNOSIS — I350 Nonrheumatic aortic (valve) stenosis: Secondary | ICD-10-CM

## 2021-01-05 ENCOUNTER — Other Ambulatory Visit: Payer: Self-pay | Admitting: Internal Medicine

## 2021-01-11 ENCOUNTER — Ambulatory Visit (INDEPENDENT_AMBULATORY_CARE_PROVIDER_SITE_OTHER): Payer: Medicare Other

## 2021-01-11 DIAGNOSIS — I442 Atrioventricular block, complete: Secondary | ICD-10-CM

## 2021-01-11 LAB — CUP PACEART REMOTE DEVICE CHECK
Battery Remaining Longevity: 155 mo
Battery Voltage: 3.02 V
Brady Statistic AP VP Percent: 0.04 %
Brady Statistic AP VS Percent: 5.15 %
Brady Statistic AS VP Percent: 0.06 %
Brady Statistic AS VS Percent: 94.75 %
Brady Statistic RA Percent Paced: 5.37 %
Brady Statistic RV Percent Paced: 0.1 %
Date Time Interrogation Session: 20220727195316
Implantable Lead Implant Date: 20200129
Implantable Lead Implant Date: 20200129
Implantable Lead Location: 753859
Implantable Lead Location: 753860
Implantable Lead Model: 3830
Implantable Lead Model: 5076
Implantable Pulse Generator Implant Date: 20200129
Lead Channel Impedance Value: 342 Ohm
Lead Channel Impedance Value: 361 Ohm
Lead Channel Impedance Value: 418 Ohm
Lead Channel Impedance Value: 494 Ohm
Lead Channel Pacing Threshold Amplitude: 0.75 V
Lead Channel Pacing Threshold Amplitude: 0.875 V
Lead Channel Pacing Threshold Pulse Width: 0.4 ms
Lead Channel Pacing Threshold Pulse Width: 0.4 ms
Lead Channel Sensing Intrinsic Amplitude: 15.5 mV
Lead Channel Sensing Intrinsic Amplitude: 15.5 mV
Lead Channel Sensing Intrinsic Amplitude: 3.375 mV
Lead Channel Sensing Intrinsic Amplitude: 3.375 mV
Lead Channel Setting Pacing Amplitude: 1.5 V
Lead Channel Setting Pacing Amplitude: 2 V
Lead Channel Setting Pacing Pulse Width: 0.4 ms
Lead Channel Setting Sensing Sensitivity: 1.2 mV

## 2021-02-06 NOTE — Progress Notes (Signed)
Remote pacemaker transmission.   

## 2021-04-12 ENCOUNTER — Ambulatory Visit (INDEPENDENT_AMBULATORY_CARE_PROVIDER_SITE_OTHER): Payer: Medicare Other

## 2021-04-12 DIAGNOSIS — I442 Atrioventricular block, complete: Secondary | ICD-10-CM

## 2021-04-12 LAB — CUP PACEART REMOTE DEVICE CHECK
Battery Remaining Longevity: 152 mo
Battery Voltage: 3.02 V
Brady Statistic AP VP Percent: 0.01 %
Brady Statistic AP VS Percent: 1.17 %
Brady Statistic AS VP Percent: 0.06 %
Brady Statistic AS VS Percent: 98.76 %
Brady Statistic RA Percent Paced: 1.18 %
Brady Statistic RV Percent Paced: 0.07 %
Date Time Interrogation Session: 20221026194832
Implantable Lead Implant Date: 20200129
Implantable Lead Implant Date: 20200129
Implantable Lead Location: 753859
Implantable Lead Location: 753860
Implantable Lead Model: 3830
Implantable Lead Model: 5076
Implantable Pulse Generator Implant Date: 20200129
Lead Channel Impedance Value: 323 Ohm
Lead Channel Impedance Value: 342 Ohm
Lead Channel Impedance Value: 380 Ohm
Lead Channel Impedance Value: 475 Ohm
Lead Channel Pacing Threshold Amplitude: 0.625 V
Lead Channel Pacing Threshold Amplitude: 0.875 V
Lead Channel Pacing Threshold Pulse Width: 0.4 ms
Lead Channel Pacing Threshold Pulse Width: 0.4 ms
Lead Channel Sensing Intrinsic Amplitude: 15.5 mV
Lead Channel Sensing Intrinsic Amplitude: 15.5 mV
Lead Channel Sensing Intrinsic Amplitude: 3.25 mV
Lead Channel Sensing Intrinsic Amplitude: 3.25 mV
Lead Channel Setting Pacing Amplitude: 1.5 V
Lead Channel Setting Pacing Amplitude: 2 V
Lead Channel Setting Pacing Pulse Width: 0.4 ms
Lead Channel Setting Sensing Sensitivity: 1.2 mV

## 2021-04-19 NOTE — Progress Notes (Signed)
Remote pacemaker transmission.   

## 2021-07-12 ENCOUNTER — Ambulatory Visit (INDEPENDENT_AMBULATORY_CARE_PROVIDER_SITE_OTHER): Payer: Self-pay

## 2021-07-12 DIAGNOSIS — I442 Atrioventricular block, complete: Secondary | ICD-10-CM

## 2021-07-12 LAB — CUP PACEART REMOTE DEVICE CHECK
Battery Remaining Longevity: 149 mo
Battery Voltage: 3.02 V
Brady Statistic AP VP Percent: 0.06 %
Brady Statistic AP VS Percent: 6.73 %
Brady Statistic AS VP Percent: 0.19 %
Brady Statistic AS VS Percent: 93.02 %
Brady Statistic RA Percent Paced: 6.77 %
Brady Statistic RV Percent Paced: 0.25 %
Date Time Interrogation Session: 20230126005635
Implantable Lead Implant Date: 20200129
Implantable Lead Implant Date: 20200129
Implantable Lead Location: 753859
Implantable Lead Location: 753860
Implantable Lead Model: 3830
Implantable Lead Model: 5076
Implantable Pulse Generator Implant Date: 20200129
Lead Channel Impedance Value: 380 Ohm
Lead Channel Impedance Value: 380 Ohm
Lead Channel Impedance Value: 418 Ohm
Lead Channel Impedance Value: 551 Ohm
Lead Channel Pacing Threshold Amplitude: 0.5 V
Lead Channel Pacing Threshold Amplitude: 1 V
Lead Channel Pacing Threshold Pulse Width: 0.4 ms
Lead Channel Pacing Threshold Pulse Width: 0.4 ms
Lead Channel Sensing Intrinsic Amplitude: 2.875 mV
Lead Channel Sensing Intrinsic Amplitude: 2.875 mV
Lead Channel Sensing Intrinsic Amplitude: 30.375 mV
Lead Channel Sensing Intrinsic Amplitude: 30.375 mV
Lead Channel Setting Pacing Amplitude: 1.5 V
Lead Channel Setting Pacing Amplitude: 2 V
Lead Channel Setting Pacing Pulse Width: 0.4 ms
Lead Channel Setting Sensing Sensitivity: 1.2 mV

## 2021-07-23 NOTE — Progress Notes (Signed)
Remote pacemaker transmission.   

## 2021-10-11 ENCOUNTER — Ambulatory Visit (INDEPENDENT_AMBULATORY_CARE_PROVIDER_SITE_OTHER): Payer: Self-pay

## 2021-10-11 DIAGNOSIS — I442 Atrioventricular block, complete: Secondary | ICD-10-CM

## 2021-10-11 LAB — CUP PACEART REMOTE DEVICE CHECK
Battery Remaining Longevity: 146 mo
Battery Voltage: 3.02 V
Brady Statistic AP VP Percent: 0.47 %
Brady Statistic AP VS Percent: 17.28 %
Brady Statistic AS VP Percent: 0.42 %
Brady Statistic AS VS Percent: 81.83 %
Brady Statistic RA Percent Paced: 17.7 %
Brady Statistic RV Percent Paced: 0.89 %
Date Time Interrogation Session: 20230426204437
Implantable Lead Implant Date: 20200129
Implantable Lead Implant Date: 20200129
Implantable Lead Location: 753859
Implantable Lead Location: 753860
Implantable Lead Model: 3830
Implantable Lead Model: 5076
Implantable Pulse Generator Implant Date: 20200129
Lead Channel Impedance Value: 304 Ohm
Lead Channel Impedance Value: 323 Ohm
Lead Channel Impedance Value: 361 Ohm
Lead Channel Impedance Value: 494 Ohm
Lead Channel Pacing Threshold Amplitude: 0.625 V
Lead Channel Pacing Threshold Amplitude: 0.75 V
Lead Channel Pacing Threshold Pulse Width: 0.4 ms
Lead Channel Pacing Threshold Pulse Width: 0.4 ms
Lead Channel Sensing Intrinsic Amplitude: 14.5 mV
Lead Channel Sensing Intrinsic Amplitude: 14.5 mV
Lead Channel Sensing Intrinsic Amplitude: 2.375 mV
Lead Channel Sensing Intrinsic Amplitude: 2.375 mV
Lead Channel Setting Pacing Amplitude: 1.5 V
Lead Channel Setting Pacing Amplitude: 2 V
Lead Channel Setting Pacing Pulse Width: 0.4 ms
Lead Channel Setting Sensing Sensitivity: 1.2 mV

## 2021-10-25 NOTE — Progress Notes (Signed)
Remote pacemaker transmission.   

## 2022-01-01 ENCOUNTER — Encounter: Payer: Self-pay | Admitting: Physician Assistant

## 2022-01-01 ENCOUNTER — Ambulatory Visit (HOSPITAL_COMMUNITY): Payer: Medicare Other | Attending: Cardiology

## 2022-01-01 DIAGNOSIS — I350 Nonrheumatic aortic (valve) stenosis: Secondary | ICD-10-CM | POA: Insufficient documentation

## 2022-01-01 LAB — ECHOCARDIOGRAM COMPLETE
AR max vel: 1.01 cm2
AV Area VTI: 1.11 cm2
AV Area mean vel: 1.05 cm2
AV Mean grad: 23 mmHg
AV Peak grad: 37.2 mmHg
Ao pk vel: 3.05 m/s
Area-P 1/2: 3.6 cm2
MV M vel: 4.91 m/s
MV Peak grad: 96.4 mmHg
P 1/2 time: 491 msec
Radius: 0.6 cm
S' Lateral: 2.5 cm

## 2022-01-02 ENCOUNTER — Telehealth: Payer: Self-pay | Admitting: *Deleted

## 2022-01-02 DIAGNOSIS — I350 Nonrheumatic aortic (valve) stenosis: Secondary | ICD-10-CM

## 2022-01-02 NOTE — Telephone Encounter (Signed)
-----   Message from Beatrice Lecher, [place] sent at 01/01/2022  4:55 PM EDT ----- Echo findings overall stable when compared with study done last year. EF normal.  There is moderate aortic stenosis and moderate tricuspid regurgitation.  There is mild to moderate aortic insufficiency. PLAN:  -Continue current medications/treatment plan and follow up as scheduled.  -Repeat echo in 1 year (aortic stenosis) -Send copy to PCP -Please make sure follow-up with Dr. Tenny Craw is arranged  Tereso Newcomer, PA-C    01/01/2022 4:48 PM

## 2022-01-10 ENCOUNTER — Ambulatory Visit (INDEPENDENT_AMBULATORY_CARE_PROVIDER_SITE_OTHER): Payer: Self-pay

## 2022-01-10 DIAGNOSIS — I442 Atrioventricular block, complete: Secondary | ICD-10-CM

## 2022-01-10 LAB — CUP PACEART REMOTE DEVICE CHECK
Battery Remaining Longevity: 143 mo
Battery Voltage: 3.02 V
Brady Statistic AP VP Percent: 0.27 %
Brady Statistic AP VS Percent: 5.53 %
Brady Statistic AS VP Percent: 0.73 %
Brady Statistic AS VS Percent: 93.47 %
Brady Statistic RA Percent Paced: 5.77 %
Brady Statistic RV Percent Paced: 1 %
Date Time Interrogation Session: 20230727035843
Implantable Lead Implant Date: 20200129
Implantable Lead Implant Date: 20200129
Implantable Lead Location: 753859
Implantable Lead Location: 753860
Implantable Lead Model: 3830
Implantable Lead Model: 5076
Implantable Pulse Generator Implant Date: 20200129
Lead Channel Impedance Value: 323 Ohm
Lead Channel Impedance Value: 342 Ohm
Lead Channel Impedance Value: 380 Ohm
Lead Channel Impedance Value: 513 Ohm
Lead Channel Pacing Threshold Amplitude: 0.625 V
Lead Channel Pacing Threshold Amplitude: 0.875 V
Lead Channel Pacing Threshold Pulse Width: 0.4 ms
Lead Channel Pacing Threshold Pulse Width: 0.4 ms
Lead Channel Sensing Intrinsic Amplitude: 2.875 mV
Lead Channel Sensing Intrinsic Amplitude: 2.875 mV
Lead Channel Sensing Intrinsic Amplitude: 20.5 mV
Lead Channel Sensing Intrinsic Amplitude: 20.5 mV
Lead Channel Setting Pacing Amplitude: 1.5 V
Lead Channel Setting Pacing Amplitude: 2 V
Lead Channel Setting Pacing Pulse Width: 0.4 ms
Lead Channel Setting Sensing Sensitivity: 1.2 mV

## 2022-01-18 NOTE — Progress Notes (Signed)
Cardiology Office Note   Date:  01/21/2022   ID:  Kelsey [name], Kelsey [name] [birth year], MRN [id number]  PCP:  Glendon Axe, MD  Cardiologist:   Dorris Carnes, MD   Patient presents fro follow up of HTN and AS      History of Present Illness: Kelsey [name] is a 76 y.o. 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  Limit her activity       Breathing is OK   No CP  No dizziness   No palpitations   Current Meds  Medication Sig   amLODipine (NORVASC) 10 MG tablet Take 10 mg by mouth daily.   atorvastatin (LIPITOR) 40 MG tablet Take 40 mg by mouth every evening.   Calcium Carb-Cholecalciferol (CALCIUM + D3 PO) Take 1 capsule by mouth daily.   chlorthalidone (HYGROTON) 25 MG tablet TAKE 1 TABLET BY MOUTH EVERY DAY   EPINEPHrine 0.3 mg/0.3 mL IJ SOAJ injection Inject 0.3 mg into the muscle as needed for anaphylaxis.   ferrous sulfate 325 (65 FE) MG EC tablet Take 1 tablet by mouth daily.   HYDROcodone-acetaminophen (NORCO) 10-325 MG tablet Take 1 tablet by mouth 2 (two) times daily as needed for severe pain.    Metoprolol Succinate 25 MG CS24 Take 25 mg by mouth daily.   Multiple Vitamins-Minerals (MULTIVITAMIN WITH MINERALS) tablet Take 1 tablet by mouth daily.   prednisoLONE acetate (PRED FORTE) 1 % ophthalmic suspension Place 1 drop into the left eye daily.   timolol (TIMOPTIC) 0.5 % ophthalmic solution Place 1 drop into the left eye every morning.     Allergies:   Bee venom and Oxycodone-acetaminophen   Past Medical History:  Diagnosis Date   Aortic stenosis    Echo 7/22: EF 60-65, no RWMA, GRII DD, normal RVSF, RVSP 36.6, mild LAE, mild MR, mild-moderate TR, mild AI, moderate aortic stenosis (mean 24 mmHg,  Vmax 349 cm/s, DI 0.45) // Echo 12/2021: EF 60-65, no RWMA, mod asymmetric LVH, GR 1 DD, GLS -19.2, normal RVSF, mild elevated PASP (40.5), mild LAE, mild MR, mod TR, mild to mod AI, mod AS (mean 23 mmHg, V-max 305 cm/s, DI 0.39)   Arthritis    OA   Heart murmur    refer to cardiologist note from dr Dorris Carnes   Hyperlipidemia    Hypertension    Left ureteral calculus    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   CHB   Wears dentures    upper  and partial lower   Wears glasses     Past Surgical History:  Procedure Laterality Date   ABDOMINAL HYSTERECTOMY     CARDIOVASCULAR STRESS TEST  03-31-2012   normal perfusion study/  no ischemia/  ef 69%   COLONOSCOPY WITH ESOPHAGOGASTRODUODENOSCOPY (EGD)  09-21-2003   CORNEAL TRANSPLANT Left 2002   CYSTOSCOPY WITH URETEROSCOPY AND STENT PLACEMENT Left 01/07/2014   Procedure: CYSTOSCOPY WITH LEFT RETRGRADE, URETEROSCOPY , AND LASER LITHOTRIPSY STONE EXTRACTION AND LEFT STENT PLACEMENT;  Surgeon: Ardis Hughs, MD;  Location: [facility];  Service: Urology;  Laterality: Left;   ESOPHAGOGASTRODUODENOSCOPY N/A 05/19/2020  Procedure: ESOPHAGOGASTRODUODENOSCOPY (EGD);  Surgeon: Vida Rigger, MD;  Location: [facility];  Service: Endoscopy;  Laterality: N/A;   FOREIGN BODY REMOVAL Left 02/10/2020   Procedure: Removal of retained foreign body of left great toe;  Surgeon: Toni Arthurs, MD;  Location: [facility];  Service: Orthopedics;  Laterality: Left;    HOLMIUM LASER APPLICATION Left 01/07/2014   Procedure: HOLMIUM LASER APPLICATION;  Surgeon: Crist Fat, MD;  Location: [facility];  Service: Urology;  Laterality: Left;   PACEMAKER IMPLANT N/A 07/15/2018   Procedure: PACEMAKER IMPLANT;  Surgeon: Marinus Maw, MD;  Location: [facility];  Service: Cardiovascular;  Laterality: N/A;   REVISION TOTAL KNEE ARTHROPLASTY  left 08-01-2003/  right 02-20-02006   TOTAL KNEE  ARTHROPLASTY  left 03-30-2003/  right 11-05-2004   post op left knee I & D arthroscopic lavage 04-20-2003   TRANSTHORACIC ECHOCARDIOGRAM  08-28-2011  dr Gunnar Fusi Aleenah Homen   moderate lvh/  ef 60-65%/  grade 2 diastolic dysfunction/ normal lv function / hyperdynamic lv with outflow murmur / turbulance through LVOT , mild SAM/ moderate lae/ moderate tr/  trivial mv & pv     Social History:  The patient  reports that she has been smoking cigarettes. She has been smoking an average of .5 packs per day. She has never used smokeless tobacco. She reports current alcohol use of about 3.0 standard drinks of alcohol per week. She reports that she does not use drugs.   Family History:  The patient's family history includes Hypertension in her brother and father.    ROS:  Please see the history of present illness. All other systems are reviewed and  Negative to the above problem except as noted.    PHYSICAL EXAM: VS:  BP 110/70   Pulse 60   Ht 5\' 3"  (1.6 m)   Wt 169 lb 9.6 oz (76.9 kg)   BMI 30.04 kg/m   GEN: Well nourished, well developed, in no acute distress  HEENT: normal  Neck: no JVD, carotid bruits, or masses Cardiac: RRR;   GrIII/VI systolic murmur base ,no edema   1+ PT pulses   Respiratory:  clear to auscultation bilaterally, normal work of breathing GI: soft, nontender, nondistended, + BS  No hepatomegaly  MS: no deformity Moving all extremities   Skin: warm and dry, no rash Neuro:  Strength and sensation are intact Psych: euthymic mood, full affect   EKG:  EKG is ordered today.  SR 60  Atrial paced    PR 212 msec     'Echo  12/2021   1. Left ventricular ejection fraction, by estimation, is 60 to 65%. The  left ventricle has normal function. The left ventricle has no regional  wall motion abnormalities. There is moderate asymmetric left ventricular  hypertrophy of the basal-septal  segment. Left ventricular diastolic parameters are consistent with Grade I  diastolic dysfunction  (impaired relaxation). The average left ventricular  global longitudinal strain is -19.2 %. The global longitudinal strain is  normal.   2. Right ventricular systolic function is normal. The right ventricular  size is normal. There is mildly elevated pulmonary artery systolic  pressure. The estimated right ventricular systolic pressure is 40.5 mmHg.   3. Left atrial size was mildly dilated.   4. The mitral valve is grossly normal. Mild mitral valve regurgitation.   5. Tricuspid valve regurgitation is moderate.   6. The aortic valve is tricuspid. There is moderate calcification of the  aortic  valve. There is moderate thickening of the aortic valve. Aortic  valve regurgitation is mild to moderate. Moderate aortic valve stenosis.  Aortic valve area, by VTI measures  1.11 cm. Aortic valve mean gradient measures 23.0 mmHg. Aortic valve Vmax  measures 3.05 m/s.   7. The inferior vena cava is normal in size with greater than 50%  respiratory variability, suggesting right atrial pressure of 3 mmHg.   Comparison(s): Compared to prior TTE 12/2020, there is no significant  change. Prior mean AoV gradient which is stable on current study.  AI now appears mild-to-moderate (previously mild).  Lipid Panel    Component Value Date/Time   CHOL 168 05/31/2018 0652   CHOL 140 07/24/2017 1307   TRIG 69 05/31/2018 0652   HDL 73 05/31/2018 0652   HDL 63 07/24/2017 1307   CHOLHDL 2.3 05/31/2018 0652   VLDL 14 05/31/2018 0652   LDLCALC 81 05/31/2018 0652   LDLCALC 62 07/24/2017 1307   LDLDIRECT 141.4 04/12/2013 1030      Wt Readings from Last 3 Encounters:  01/21/22 169 lb 9.6 oz (76.9 kg)  10/11/20 188 lb 12.8 oz (85.6 kg)  05/22/20 200 lb 1.6 oz (90.8 kg)      ASSESSMENT AND PLAN:  1 AS/AI    Moderate AS and mild to moderate AI on recent echo   Follow  up echo next summer   2  s/p PPM   for CHB  Follows with EP  3  Dyspnea/ CP   Pt denies symptoms   Myoview  in 2019 wasneg for  ischemia 4  HL  Get labs from Dr Michaelle Copas office   5  HTN  BP is excellent     6  Hx of dizziness    Admission in 2019   NO CVA    Pt denies symptoms    Get labs from PCP  Follow up in June with echo and visit     Current medicines are reviewed at length with the patient today.  The patient does not have concerns regarding medicines.  Signed, Dietrich Pates, MD  01/21/2022 8:50 AM    [facility] [address] [place], [place], [place]  [address] Phone: [phone]; Fax: [phone]

## 2022-01-21 ENCOUNTER — Encounter: Payer: Self-pay | Admitting: Internal Medicine

## 2022-01-21 ENCOUNTER — Ambulatory Visit: Payer: Medicare Other | Admitting: Internal Medicine

## 2022-01-21 VITALS — BP 110/70 | HR 60 | Ht 63.0 in | Wt 169.6 lb

## 2022-01-21 DIAGNOSIS — I350 Nonrheumatic aortic (valve) stenosis: Secondary | ICD-10-CM | POA: Diagnosis not present

## 2022-01-21 NOTE — Patient Instructions (Signed)
Medication Instructions:   *If you need a refill on your cardiac medications before your next appointment, please call your pharmacy*   Lab Work:  If you have labs (blood work) drawn today and your tests are completely normal, you will receive your results only by: MyChart Message (if you have MyChart) OR A paper copy in the mail If you have any lab test that is abnormal or we need to change your treatment, we will call you to review the results.   Testing/Procedures: In June prior to seeing Dr Tenny Craw  Your physician has requested that you have an echocardiogram. Echocardiography is a painless test that uses sound waves to create images of your heart. It provides your doctor with information about the size and shape of your heart and how well your heart's chambers and valves are working. This procedure takes approximately one hour. There are no restrictions for this procedure.    Follow-Up: At [facility], you and your health needs are our priority.  As part of our continuing mission to provide you with exceptional heart care, we have created designated Provider Care Teams.  These Care Teams include your primary Cardiologist (physician) and Advanced Practice Providers (APPs -  Physician Assistants and Nurse Practitioners) who all work together to provide you with the care you need, when you need it.  We recommend signing up for the patient portal called "MyChart".  Sign up information is provided on this After Visit Summary.  MyChart is used to connect with patients for Virtual Visits (Telemedicine).  Patients are able to view lab/test results, encounter notes, upcoming appointments, etc.  Non-urgent messages can be sent to your provider as well.   To learn more about what you can do with MyChart, go to [email or web address].    Your next appointment:   10 month(s)  The format for your next appointment:   In Person  Provider:  after your Echo  Dietrich Pates, MD     Other  Instructions   Important Information About Sugar

## 2022-01-31 NOTE — Progress Notes (Signed)
Remote pacemaker transmission.   

## 2022-02-27 ENCOUNTER — Telehealth: Payer: Self-pay | Admitting: Internal Medicine

## 2022-02-27 NOTE — Telephone Encounter (Signed)
Error  See other note

## 2022-02-27 NOTE — Telephone Encounter (Signed)
I saw pt in clinic in Aug 2023  Doing well at that time I called her today to confirm physical capabilities   Knees limit her     She is able to sweep and mop without difficulty   She denies CP or SOB with these activities Walks about 4/10 of a mile OK   No CP or SOB   Again, after that knees limit  From a cardiac standpoint she is performing over of activity    I think she is at relatively low risk for major cardiac complication with surgery    Watch fluids, watch BP.   Do not feel she needs additional cardiac testing.  Dietrich Pates MD

## 2022-03-14 ENCOUNTER — Ambulatory Visit
Admission: RE | Admit: 2022-03-14 | Discharge: 2022-03-14 | Disposition: A | Discharge status: Home / Self Care | Payer: Medicare Other | Source: Ambulatory Visit | Attending: Internal Medicine | Admitting: Internal Medicine

## 2022-03-14 ENCOUNTER — Ambulatory Visit
Admission: EM | Admit: 2022-03-14 | Discharge: 2022-03-14 | Disposition: A | Discharge status: Home / Self Care | Payer: Medicare Other | Attending: Internal Medicine | Admitting: Internal Medicine

## 2022-03-14 ENCOUNTER — Ambulatory Visit: Payer: Medicare Other

## 2022-03-14 ENCOUNTER — Encounter: Payer: Self-pay | Admitting: Emergency Medicine

## 2022-03-14 DIAGNOSIS — M545 Low back pain, unspecified: Secondary | ICD-10-CM | POA: Diagnosis not present

## 2022-03-14 DIAGNOSIS — W19XXXA Unspecified fall, initial encounter: Secondary | ICD-10-CM

## 2022-03-14 NOTE — ED Triage Notes (Signed)
Pt is present today with lower back pain from a fall yesterday,. Pt states that she took the wrong step and fell on her back. Pt denies LOC or hitting her head.

## 2022-03-14 NOTE — Discharge Instructions (Signed)
Please go to [facility] imaging at provided address to have x-ray completed.  We will call with results.  Please follow-up if any symptoms persist or worsen.

## 2022-03-14 NOTE — ED Provider Notes (Addendum)
[facility]    CSN: [id number] Arrival date & time: 03/14/22  1257      History   Chief Complaint Chief Complaint  Patient presents with   Fall   Back Pain    HPI Kelsey [name] is a 76 y.o. female.   Patient presents with lower back pain that started yesterday after a fall.  Patient reports that she has "bad knees" and she misstepped falling backwards.  Patient reports that she did land on her lower back but also "twisted her back" during the fall.  Denies hitting head or losing consciousness.  Patient has prescribed Norco for her knee pain that she took yesterday with some improvement.  Denies numbness or tingling.  Denies urinary frequency, urinary or bowel incontinence, saddle anesthesia.   Fall  Back Pain   Past Medical History:  Diagnosis Date   Aortic stenosis    Echo 7/22: EF 60-65, no RWMA, GRII DD, normal RVSF, RVSP 36.6, mild LAE, mild MR, mild-moderate TR, mild AI, moderate aortic stenosis (mean 24 mmHg, Vmax 349 cm/s, DI 0.45) // Echo 12/2021: EF 60-65, no RWMA, mod asymmetric LVH, GR 1 DD, GLS -19.2, normal RVSF, mild elevated PASP (40.5), mild LAE, mild MR, mod TR, mild to mod AI, mod AS (mean 23 mmHg, V-max 305 cm/s, DI 0.39)   Arthritis    OA   Heart murmur    refer to cardiologist note from dr Dietrich Patespaula ross   Hyperlipidemia    Hypertension    Left ureteral calculus    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   CHB   Wears dentures    upper  and partial lower   Wears glasses     Patient Active Problem List   Diagnosis Date Noted   GIB (gastrointestinal bleeding) 05/19/2020   Pacemaker 08/31/2019   Complete heart block (HCC) 07/15/2018   TIA (transient ischemic attack) 06/03/2018   Dizziness 06/03/2018   CVA (cerebral vascular accident) (HCC) 05/31/2018   Bradycardia 05/27/2018   Aortic stenosis 05/27/2018   Osteoarthritis of both knees 07/02/2013   Tobacco abuse 04/01/2012   Malignant HTN with heart disease, w/o CHF, 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 04/01/2012   Midsternal chest pain 04/01/2012   CHEST PAIN-UNSPECIFIED 03/09/2010   Hyperlipidemia 10/13/2009   HYPERTENSION, BENIGN 10/13/2009   UNSPECIFIED TACHYCARDIA 10/13/2009   DYSPNEA 10/13/2009    Past Surgical History:  Procedure Laterality Date   ABDOMINAL HYSTERECTOMY     CARDIOVASCULAR STRESS TEST  03-31-2012   normal perfusion study/  no ischemia/  ef 69%   COLONOSCOPY WITH ESOPHAGOGASTRODUODENOSCOPY (EGD)  09-21-2003   CORNEAL TRANSPLANT Left 2002   CYSTOSCOPY WITH URETEROSCOPY AND STENT PLACEMENT Left 01/07/2014   Procedure: CYSTOSCOPY WITH LEFT RETRGRADE, URETEROSCOPY , AND LASER LITHOTRIPSY STONE EXTRACTION AND LEFT STENT PLACEMENT;  Surgeon: Crist FatBenjamin W Herrick, MD;  [facility];  Service: Urology;  Laterality: Left;   ESOPHAGOGASTRODUODENOSCOPY N/A 05/19/2020   Procedure: ESOPHAGOGASTRODUODENOSCOPY (EGD);  Surgeon: Vida RiggerMagod, Marc, MD;  [facility];  Service: Endoscopy;  Laterality: N/A;   FOREIGN BODY REMOVAL Left 02/10/2020   Procedure: Removal of retained foreign body of left great toe;  Surgeon: Toni ArthursHewitt, John, MD;  Location: [facility];  Service: Orthopedics;  Laterality: Left;  30min   HOLMIUM LASER APPLICATION Left 01/07/2014   Procedure: HOLMIUM LASER APPLICATION;  Surgeon: Crist FatBenjamin W Herrick, MD;  [facility];  Service: Urology;  Laterality: Left;   PACEMAKER IMPLANT N/A 07/15/2018  Procedure: PACEMAKER IMPLANT;  Surgeon: Evans Lance, MD;  Location: [facility];  Service: Cardiovascular;  Laterality: N/A;   REVISION TOTAL KNEE ARTHROPLASTY  left 08-01-2003/  right 02-20-02006   TOTAL KNEE ARTHROPLASTY  left 03-30-2003/  right 11-05-2004   post op left knee I & D arthroscopic lavage 04-20-2003   TRANSTHORACIC ECHOCARDIOGRAM  08-28-2011  dr Nevin Bloodgood ross   moderate lvh/  ef 60-63%/  grade 2 diastolic dysfunction/ normal lv function / hyperdynamic lv with outflow  murmur / turbulance through LVOT , mild SAM/ moderate lae/ moderate tr/  trivial mv & pv    OB History   No obstetric history on file.      Home Medications    Prior to Admission medications   Medication Sig Start Date End Date Taking? Authorizing Provider  amLODipine (NORVASC) 10 MG tablet Take 10 mg by mouth daily. 08/11/19   [provider]  atorvastatin (LIPITOR) 40 MG tablet Take 40 mg by mouth every evening. 04/13/13   Fay Records, MD  Calcium Carb-Cholecalciferol (CALCIUM + D3 PO) Take 1 capsule by mouth daily.    [provider]  chlorthalidone (HYGROTON) 25 MG tablet TAKE 1 TABLET BY MOUTH EVERY DAY 01/08/21   Evans Lance, MD  EPINEPHrine 0.3 mg/0.3 mL IJ SOAJ injection Inject 0.3 mg into the muscle as needed for anaphylaxis.    [provider]  ferrous sulfate 325 (65 FE) MG EC tablet Take 1 tablet by mouth daily. 05/06/19   [provider]  HYDROcodone-acetaminophen (NORCO) 10-325 MG tablet Take 1 tablet by mouth 2 (two) times daily as needed for severe pain.     [provider]  Metoprolol Succinate 25 MG CS24 Take 25 mg by mouth daily.    [provider]  Multiple Vitamins-Minerals (MULTIVITAMIN WITH MINERALS) tablet Take 1 tablet by mouth daily.    [provider]  prednisoLONE acetate (PRED FORTE) 1 % ophthalmic suspension Place 1 drop into the left eye daily. 02/04/20   [provider]  timolol (TIMOPTIC) 0.5 % ophthalmic solution Place 1 drop into the left eye every morning. 01/24/20   [provider]    Family History Family History  Problem Relation Age of Onset   Hypertension Father    Hypertension Brother     Social History Social History   Tobacco Use   Smoking status: Some Days    Packs/day: 0.50    Types: Cigarettes   Smokeless tobacco: Never   Tobacco comments:    per pt occasional smoker  Vaping Use   Vaping Use: Never used  Substance Use Topics   Alcohol use: Yes     Alcohol/week: 3.0 standard drinks of alcohol    Types: 3 Glasses of wine per week    Comment: social   Drug use: No     Allergies   Bee venom and Oxycodone-acetaminophen   Review of Systems Review of Systems Per HPI  Physical Exam Triage Vital Signs ED Triage Vitals [03/14/22 1330]  Enc Vitals Group     BP 114/62     Pulse Rate 81     Resp 18     Temp 97.8 F (36.6 C)     Temp src      SpO2 98 %     Weight      Height      Head Circumference      Peak Flow      Pain Score 7  Pain Loc      Pain Edu?      Excl. in [place]?    No data found.  Updated Vital Signs BP 114/62   Pulse 81   Temp 97.8 F (36.6 C)   Resp 18   SpO2 98%   Visual Acuity Right Eye Distance:   Left Eye Distance:   Bilateral Distance:    Right Eye Near:   Left Eye Near:    Bilateral Near:     Physical Exam Constitutional:      General: She is not in acute distress.    Appearance: Normal appearance. She is not toxic-appearing or diaphoretic.  HENT:     Head: Normocephalic and atraumatic.  Eyes:     Extraocular Movements: Extraocular movements intact.     Conjunctiva/sclera: Conjunctivae normal.  Pulmonary:     Effort: Pulmonary effort is normal.  Musculoskeletal:     Comments: Tenderness to palpation to midline lower back.  No obvious discoloration, warmth, lacerations, abrasions, swelling noted.  No crepitus or step-off noted.  No tenderness through the thoracic back or cervical area.  Neurological:     General: No focal deficit present.     Mental Status: She is alert and oriented to person, place, and time. Mental status is at baseline.     Deep Tendon Reflexes: Reflexes are normal and symmetric.  Psychiatric:        Mood and Affect: Mood normal.        Behavior: Behavior normal.        Thought Content: Thought content normal.        Judgment: Judgment normal.      UC Treatments / Results  Labs (all labs ordered are listed, but only abnormal results are  displayed) Labs Reviewed - No data to display  EKG   Radiology DG Lumbar Spine Complete  Result Date: 03/14/2022 CLINICAL DATA:  Fall with low back pain. EXAM: LUMBAR SPINE - COMPLETE 4+ VIEW COMPARISON:  11/27/2015 FINDINGS: Five lumbar type vertebral bodies. Sacroiliac joints are symmetric. Presumably vascular calcifications in the left hemipelvis are not significantly changed. Pacer is incompletely imaged. Osteopenia. Mild superior endplate compression deformity at L1 is similar. A mild superior endplate compression deformity at L2 is new since the prior, no ventral canal encroachment. Mild loss of intervertebral disc height at L5-S1 with facet arthropathy at L4-L5 and L5-S1. IMPRESSION: 1. New mild superior endplate compression deformity at L2 of indeterminate acuity. 2. Chronic mild L1 compression deformity. 3. Spondylosis, primarily at L5-S1. Electronically Signed   By: Abigail Miyamoto M.D.   On: 03/14/2022 14:57    Procedures Procedures (including critical care time)  Medications Ordered in UC Medications - No data to display  Initial Impression / Assessment and Plan / UC Course  I have reviewed the triage vital signs and the nursing notes.  Pertinent labs & imaging results that were available during my care of the patient were reviewed by me and considered in my medical decision making (see chart for details).     Given traumatic fall to back, I do think that x-rays are warranted.  Do not have working x-ray machine here in urgent care today so patient agreed to outpatient imaging at [place].  Currently awaiting results.  Advised patient of supportive care including ice application to affected area.  Patient already has Norco that she takes for her knee pain which should be sufficient for back pain as well.  Patient was given strict return precautions.  Patient  verbalized understanding and was agreeable with plan.  Received x-ray results from [place].  It showed a  new mild possible compression fracture at L2.  Unsure if this is due to recent trauma or due to bone density.  Although, should assume it could be due to trauma.  Therefore, patient was called and advised that she will need to follow-up with orthopedist for further evaluation and management.  No other worrisome complications on physical exam and patient appears well so do not think that emergent evaluation or emergent consult is necessary at this time.  Advised supportive care including ice application, limiting movement, and taking her already prescribed hydrocodone for pain.  Patient provided with contact information for orthopedist.  All questions answered.  Patient verbalized understanding and was agreeable with plan. Final Clinical Impressions(s) / UC Diagnoses   Final diagnoses:  Fall, initial encounter  Acute midline low back pain without sciatica     Discharge Instructions      Please go to [facility] imaging at provided address to have x-ray completed.  We will call with results.  Please follow-up if any symptoms persist or worsen.     ED Prescriptions   None    PDMP not reviewed this encounter.   Teodora Medici, [place] 03/14/22 [place], [place], [place] 03/14/22 1535

## 2022-04-11 ENCOUNTER — Ambulatory Visit (INDEPENDENT_AMBULATORY_CARE_PROVIDER_SITE_OTHER): Payer: Medicare Other

## 2022-04-11 DIAGNOSIS — I442 Atrioventricular block, complete: Secondary | ICD-10-CM | POA: Diagnosis not present

## 2022-04-11 LAB — CUP PACEART REMOTE DEVICE CHECK
Battery Remaining Longevity: 140 mo
Battery Voltage: 3.01 V
Brady Statistic AP VP Percent: 0.09 %
Brady Statistic AP VS Percent: 11.1 %
Brady Statistic AS VP Percent: 0.6 %
Brady Statistic AS VS Percent: 88.2 %
Brady Statistic RA Percent Paced: 11.19 %
Brady Statistic RV Percent Paced: 0.7 %
Date Time Interrogation Session: 20231025232333
Implantable Lead Connection Status: 753985
Implantable Lead Connection Status: 753985
Implantable Lead Implant Date: 20200129
Implantable Lead Implant Date: 20200129
Implantable Lead Location: 753859
Implantable Lead Location: 753860
Implantable Lead Model: 3830
Implantable Lead Model: 5076
Implantable Pulse Generator Implant Date: 20200129
Lead Channel Impedance Value: 304 Ohm
Lead Channel Impedance Value: 323 Ohm
Lead Channel Impedance Value: 361 Ohm
Lead Channel Impedance Value: 475 Ohm
Lead Channel Pacing Threshold Amplitude: 0.75 V
Lead Channel Pacing Threshold Amplitude: 0.75 V
Lead Channel Pacing Threshold Pulse Width: 0.4 ms
Lead Channel Pacing Threshold Pulse Width: 0.4 ms
Lead Channel Sensing Intrinsic Amplitude: 14.875 mV
Lead Channel Sensing Intrinsic Amplitude: 14.875 mV
Lead Channel Sensing Intrinsic Amplitude: 2.875 mV
Lead Channel Sensing Intrinsic Amplitude: 2.875 mV
Lead Channel Setting Pacing Amplitude: 1.5 V
Lead Channel Setting Pacing Amplitude: 2 V
Lead Channel Setting Pacing Pulse Width: 0.4 ms
Lead Channel Setting Sensing Sensitivity: 1.2 mV
Zone Setting Status: 755011
Zone Setting Status: 755011

## 2022-04-19 NOTE — Progress Notes (Signed)
Remote pacemaker transmission.   

## 2022-05-23 NOTE — H&P (Signed)
Subjective:     Patient ID: Kelsey [name] is a 76 y.o. female.   Follow-up   Returns for follow up discussion prior to planned panniculectomy.  Wt down over 30 lb since time of initial consultation 2019. Stable at this weight for at least 4 months. Complains of recurrent infections beneath skin folds for over 3 years. Tries to control with topical powders, OTC antibiotic cream, hygiene measures. This has not improved with over year trial of these.   Lives with son. Retired Therapist, occupational transit. Has family in area to help with care.   PMH significant pacemaker placement for complete heart block. History UGI bleed 2021. ECHO 12/2021 EF 60-65%, moderate AS/AI.    She also has known loosening left TKA and being managed expectantly.   On pain contract with [facility], chronic Norco use.    Reports quit smoking 8.29.23.   Review of Systems      Objective:   Physical Exam  Constitutional: She is oriented to person, place, and time.  Cardiovascular: Normal rate.  SEM present  Pulmonary/Chest: Effort normal.  Neurological: She is alert and oriented to person, place, and time.  Skin:  Fitzpatrick 6    Abd: soft no hernias though umbilicus is attenuated, redundant soft tissue infraumbilical, chest rolls also present, panniculus hangs to below pubic bone, no active rashes. Supraumbilical abdomen with significant soft tissue pinch/thickness    Assessment:     Panniculitis Obesity    Plan:     Dr. Tenny Craw recommended no additional cardiac workup. Hold ASA 10 d prior to surgery.   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 Panniculectomy is expected to improve the interference with ADLs.    Reviewed panniculectomy vs abdominoplasty. Reviewed this is not a cosmetic procedure. Reviewed changes with aging, wt gain/loss, will not have as taught skin given significant loss  elasticity.stay, drains, post op limitations. Reviewed scar maturation over months. Reviewed expected area scars area soft tissue resection and expected elevation mons in mirror. Counseled will not change contour mons, will not affect back or thighs or upper abdomen. Reviewed risk wound healing problems, seroma, fat necrosis, etc and may need for prolonged wound care.    Additional risks including but not limited to bleeding, hematoma, need for additional procedure, damage to adjacent structures, unacceptable cosmetic result, infection, blood clots in legs or lungs reviewed.   Drain teaching completed. Patient has Norco from PCP. Asked her to inform PCP of surgery and if necessary will provide one time Rx at time of hospital discharge.

## 2022-05-24 NOTE — Progress Notes (Signed)
During PAT chart prep patient noted to have a Pacemaker. Sent via [facility] message to Dr.Gregory Ladona Ridgel for device orders. Also notified Tobie Lords medtronic representative via email, cc Doug Sou, RN. Also left message for Shari Prows via his voice mail . Shari Prows returned my call so I notified him of the email sent and message left on voicemail.

## 2022-05-27 ENCOUNTER — Encounter (HOSPITAL_COMMUNITY): Payer: Self-pay

## 2022-05-27 ENCOUNTER — Encounter (HOSPITAL_COMMUNITY)
Admission: RE | Admit: 2022-05-27 | Discharge: 2022-05-27 | Disposition: A | Discharge status: Home / Self Care | Payer: Medicare Other | Source: Ambulatory Visit | Attending: Plastic Surgery | Admitting: Plastic Surgery

## 2022-05-27 ENCOUNTER — Other Ambulatory Visit: Payer: Self-pay

## 2022-05-27 DIAGNOSIS — D638 Anemia in other chronic diseases classified elsewhere: Secondary | ICD-10-CM | POA: Diagnosis not present

## 2022-05-27 DIAGNOSIS — Z01812 Encounter for preprocedural laboratory examination: Secondary | ICD-10-CM | POA: Insufficient documentation

## 2022-05-27 DIAGNOSIS — M793 Panniculitis, unspecified: Secondary | ICD-10-CM | POA: Insufficient documentation

## 2022-05-27 LAB — CBC WITH DIFFERENTIAL/PLATELET
Abs Immature Granulocytes: 0.01 10*3/uL (ref 0.00–0.07)
Basophils Absolute: 0 10*3/uL (ref 0.0–0.1)
Basophils Relative: 1 %
Eosinophils Absolute: 0.3 10*3/uL (ref 0.0–0.5)
Eosinophils Relative: 5 %
HCT: 37.5 % (ref 36.0–46.0)
Hemoglobin: 12 g/dL (ref 12.0–15.0)
Immature Granulocytes: 0 %
Lymphocytes Relative: 34 %
Lymphs Abs: 1.8 10*3/uL (ref 0.7–4.0)
MCH: 29.6 pg (ref 26.0–34.0)
MCHC: 32 g/dL (ref 30.0–36.0)
MCV: 92.4 fL (ref 80.0–100.0)
Monocytes Absolute: 0.5 10*3/uL (ref 0.1–1.0)
Monocytes Relative: 9 %
Neutro Abs: 2.7 10*3/uL (ref 1.7–7.7)
Neutrophils Relative %: 51 %
Platelets: 240 10*3/uL (ref 150–400)
RBC: 4.06 MIL/uL (ref 3.87–5.11)
RDW: 14.5 % (ref 11.5–15.5)
WBC: 5.3 10*3/uL (ref 4.0–10.5)
nRBC: 0 % (ref 0.0–0.2)

## 2022-05-27 LAB — BASIC METABOLIC PANEL
Anion gap: 8 (ref 5–15)
BUN: 20 mg/dL (ref 8–23)
CO2: 26 mmol/L (ref 22–32)
Calcium: 9.7 mg/dL (ref 8.9–10.3)
Chloride: 106 mmol/L (ref 98–111)
Creatinine, Ser: 0.75 mg/dL (ref 0.44–1.00)
GFR, Estimated: >60 mL/min (ref >60)
Glucose, Bld: 98 mg/dL (ref 70–99)
Potassium: 3.4 mmol/L — ABNORMAL LOW (ref 3.5–5.1)
Sodium: 140 mmol/L (ref 135–145)

## 2022-05-27 NOTE — Progress Notes (Signed)
Surgical Instructions    Your procedure is scheduled on Monday, 06/03/22.  Report to [facility] Main Entrance "A" at 5:30 A.M., then check in with the Admitting office.  Call this number if you have problems the morning of surgery:  [phone]   If you have any questions prior to your surgery date call [phone]: Open Monday-Friday 8am-4pm If you experience any cold or flu symptoms such as cough, fever, chills, shortness of breath, etc. between now and your scheduled surgery, please notify us at the above number     Remember:  Do not eat after midnight the night before your surgery  You may drink clear liquids until 4:15am the morning of your surgery.   Clear liquids allowed are: Water, Non-Citrus Juices (without pulp), Carbonated Beverages, Clear Tea, Black Coffee ONLY (NO MILK, CREAM OR POWDERED CREAMER of any kind), and Gatorade    Take these medicines the morning of surgery with A SIP OF WATER:  amLODipine (NORVASC)  metoprolol succinate (TOPROL-XL)  pantoprazole (PROTONIX)  prednisoLONE acetate (PRED FORTE)  timolol (TIMOPTIC)   IF NEEDED: HYDROcodone-acetaminophen (NORCO)   As of today, STOP taking any Aspirin (unless otherwise instructed by your surgeon) Aleve, Naproxen, Ibuprofen, Motrin, Advil, Goody's, BC's, all herbal medications, fish oil, and all vitamins.           Do not wear jewelry or makeup. Do not wear lotions, powders, perfumes or deodorant. Do not shave 48 hours prior to surgery.  Do not bring valuables to the hospital. Do not wear nail polish, gel polish, artificial nails, or any other type of covering on natural nails (fingers and toes) If you have artificial nails or gel coating that need to be removed by a nail salon, please have this removed prior to surgery. Artificial nails or gel coating may interfere with anesthesia's ability to adequately monitor your vital signs.  [facility] is not responsible for any belongings or valuables.    Do NOT  Smoke (Tobacco/Vaping)  24 hours prior to your procedure  If you use a CPAP at night, you may bring your mask for your overnight stay.   Contacts, glasses, hearing aids, dentures or partials may not be worn into surgery, please bring cases for these belongings   For patients admitted to the hospital, discharge time will be determined by your treatment team.   Patients discharged the day of surgery will not be allowed to drive home, and someone needs to stay with them for 24 hours.   SURGICAL WAITING ROOM VISITATION Patients having surgery or a procedure may have no more than 2 support people in the waiting area - these visitors may rotate.   Children under the age of 55 must have an adult with them who is not the patient. If the patient needs to stay at the hospital during part of their recovery, the visitor guidelines for inpatient rooms apply. Pre-op nurse will coordinate an appropriate time for 1 support person to accompany patient in pre-op.  This support person may not rotate.   Please refer to [email or web address] for the visitor guidelines for Inpatients (after your surgery is over and you are in a regular room).    Special instructions:    Oral Hygiene is also important to reduce your risk of infection.  Remember - BRUSH YOUR TEETH THE MORNING OF SURGERY WITH YOUR REGULAR TOOTHPASTE   [facility]- Preparing For Surgery  Before surgery, you can play an important role. Because skin is not sterile, your skin needs to be  as free of germs as possible. You can reduce the number of germs on your skin by washing with CHG (chlorahexidine gluconate) Soap before surgery.  CHG is an antiseptic cleaner which kills germs and bonds with the skin to continue killing germs even after washing.     Please do not use if you have an allergy to CHG or antibacterial soaps. If your skin becomes reddened/irritated stop using the CHG.  Do not shave  (including legs and underarms) for at least 48 hours prior to first CHG shower. It is OK to shave your face.  Please follow these instructions carefully.     Shower the NIGHT BEFORE SURGERY and the MORNING OF SURGERY with CHG Soap.   If you chose to wash your hair, wash your hair first as usual with your normal shampoo. After you shampoo, rinse your hair and body thoroughly to remove the shampoo.  Then [employer] and genitals (private parts) with your normal soap and rinse thoroughly to remove soap.  After that Use CHG Soap as you would any other liquid soap. You can apply CHG directly to the skin and wash gently with a scrungie or a clean washcloth.   Apply the CHG Soap to your body ONLY FROM THE NECK DOWN.  Do not use on open wounds or open sores. Avoid contact with your eyes, ears, mouth and genitals (private parts). Wash Face and genitals (private parts)  with your normal soap.   Wash thoroughly, paying special attention to the area where your surgery will be performed.  Thoroughly rinse your body with warm water from the neck down.  DO NOT shower/wash with your normal soap after using and rinsing off the CHG Soap.  Pat yourself dry with a CLEAN TOWEL.  Wear CLEAN PAJAMAS to bed the night before surgery  Place CLEAN SHEETS on your bed the night before your surgery  DO NOT SLEEP WITH PETS.   Day of Surgery: Take a shower with CHG soap. Wear Clean/Comfortable clothing the morning of surgery Do not apply any deodorants/lotions.   Remember to brush your teeth WITH YOUR REGULAR TOOTHPASTE.    If you received a COVID test during your pre-op visit, it is requested that you wear a mask when out in public, stay away from anyone that may not be feeling well, and notify your surgeon if you develop symptoms. If you have been in contact with anyone that has tested positive in the last 10 days, please notify your surgeon.    Please read over the following fact sheets that you were  given.

## 2022-05-27 NOTE — Progress Notes (Signed)
PCP - Caffie Damme Cardiologist - [name] and Dietrich Pates  PPM/ICD - pacemaker (medtronic)  Device Orders - faxed and received Rep Notified - notified of date of time of procedure  Chest x-ray - n/a EKG - 12/22/21 Stress Test - 08/01/17 ECHO - 01/01/22 Cardiac Cath - denies  Sleep Study - denies  As of today, STOP taking any Aspirin (unless otherwise instructed by your surgeon) Aleve, Naproxen, Ibuprofen, Motrin, Advil, Goody's, BC's, all herbal medications, fish oil, and all vitamins.   ERAS Protcol -yes PRE-SURGERY Ensure or G2- none ordered  COVID TEST- not needed   Anesthesia review: yes, needs appt with Dr. Ladona Ridgel for cardiac clearance and pacemaker recommendations for surgery  Patient denies shortness of breath, fever, cough and chest pain at PAT appointment   All instructions explained to the patient, with a verbal understanding of the material. Patient agrees to go over the instructions while at home for a better understanding. Patient also instructed to self quarantine after being tested for COVID-19. The opportunity to ask questions was provided.

## 2022-05-28 NOTE — Progress Notes (Signed)
Anesthesia Chart Review:  Follows with cardiology for history of chest pain (negative myoview); moderate AS, mild-mod AI, HTN, vigorous LV  function, CHB (3.5 sec pause) with Medtronic PPM in 2019. Cardiac clearance per telephone encounter 02/27/22 by Dr. Tenny Craw states, "I saw pt in clinic in Aug 2023  Doing well at that time I called her today to confirm physical capabilities Knees limit her  She is able to sweep and mop without difficulty  She denies CP or SOB with these activities Walks about 4/10 of a mile OK   No CP or SOB   Again, after that knees limit From a cardiac standpoint she is performing over of activity    I think she is at relatively low risk for major cardiac complication with surgery    Watch fluids, watch BP.   Do not feel she needs additional cardiac testing."  Seeing Dr. Ladona Ridgel 05/30/22***  TTE 01/01/22:  1. Left ventricular ejection fraction, by estimation, is 60 to 65%. The  left ventricle has normal function. The left ventricle has no regional  wall motion abnormalities. There is moderate asymmetric left ventricular  hypertrophy of the basal-septal  segment. Left ventricular diastolic parameters are consistent with Grade I  diastolic dysfunction (impaired relaxation). The average left ventricular  global longitudinal strain is -19.2 %. The global longitudinal strain is  normal.   2. Right ventricular systolic function is normal. The right ventricular  size is normal. There is mildly elevated pulmonary artery systolic  pressure. The estimated right ventricular systolic pressure is 40.5 mmHg.   3. Left atrial size was mildly dilated.   4. The mitral valve is grossly normal. Mild mitral valve regurgitation.   5. Tricuspid valve regurgitation is moderate.   6. The aortic valve is tricuspid. There is moderate calcification of the  aortic valve. There is moderate thickening of the aortic valve. Aortic  valve regurgitation is mild to moderate. Moderate aortic valve  stenosis.  Aortic valve area, by VTI measures  1.11 cm. Aortic valve mean gradient measures 23.0 mmHg. Aortic valve Vmax  measures 3.05 m/s.   7. The inferior vena cava is normal in size with greater than 50%  respiratory variability, suggesting right atrial pressure of 3 mmHg.   Comparison(s): Compared to prior TTE 12/2020, there is no significant  change. Prior mean AoV gradient which is stable on current study.  AI now appears mild-to-moderate (previously mild).

## 2022-05-30 ENCOUNTER — Encounter: Payer: Self-pay | Admitting: Internal Medicine

## 2022-05-30 ENCOUNTER — Ambulatory Visit: Payer: Medicare Other | Attending: Internal Medicine | Admitting: Internal Medicine

## 2022-05-30 VITALS — BP 116/64 | HR 60 | Ht 64.0 in | Wt 174.6 lb

## 2022-05-30 DIAGNOSIS — I495 Sick sinus syndrome: Secondary | ICD-10-CM | POA: Diagnosis not present

## 2022-05-30 DIAGNOSIS — Z01818 Encounter for other preprocedural examination: Secondary | ICD-10-CM | POA: Diagnosis not present

## 2022-05-30 LAB — CUP PACEART INCLINIC DEVICE CHECK
Battery Remaining Longevity: 138 mo
Battery Voltage: 3.01 V
Brady Statistic AP VP Percent: 0.13 %
Brady Statistic AP VS Percent: 6.47 %
Brady Statistic AS VP Percent: 0.28 %
Brady Statistic AS VS Percent: 93.12 %
Brady Statistic RA Percent Paced: 6.61 %
Brady Statistic RV Percent Paced: 0.41 %
Date Time Interrogation Session: 20231214121806
Implantable Lead Connection Status: 753985
Implantable Lead Connection Status: 753985
Implantable Lead Implant Date: 20200129
Implantable Lead Implant Date: 20200129
Implantable Lead Location: 753859
Implantable Lead Location: 753860
Implantable Lead Model: 3830
Implantable Lead Model: 5076
Implantable Pulse Generator Implant Date: 20200129
Lead Channel Impedance Value: 304 Ohm
Lead Channel Impedance Value: 304 Ohm
Lead Channel Impedance Value: 361 Ohm
Lead Channel Impedance Value: 494 Ohm
Lead Channel Pacing Threshold Amplitude: 0.625 V
Lead Channel Pacing Threshold Amplitude: 0.875 V
Lead Channel Pacing Threshold Pulse Width: 0.4 ms
Lead Channel Pacing Threshold Pulse Width: 0.4 ms
Lead Channel Sensing Intrinsic Amplitude: 14.25 mV
Lead Channel Sensing Intrinsic Amplitude: 16.25 mV
Lead Channel Sensing Intrinsic Amplitude: 2 mV
Lead Channel Sensing Intrinsic Amplitude: 2.625 mV
Lead Channel Setting Pacing Amplitude: 1.5 V
Lead Channel Setting Pacing Amplitude: 2 V
Lead Channel Setting Pacing Pulse Width: 0.4 ms
Lead Channel Setting Sensing Sensitivity: 1.2 mV
Zone Setting Status: 755011
Zone Setting Status: 755011

## 2022-05-30 NOTE — Progress Notes (Signed)
HPI Kelsey [name] returns today for followup. She is a pleasant 76 yo woman with sinus node dysfunction, s/p PPM insertion. She has had improvement in her AV conduction. She has lost over 50 lbs and stopped smoking!  She is pending surgery to remove some of her pannus.  No chest pain or sob. No edema.   Allergies  Allergen Reactions   Bee Venom Anaphylaxis    ALL BEE STINGS   Oxycodone-Acetaminophen Itching     Current Outpatient Medications  Medication Sig Dispense Refill   amLODipine (NORVASC) 10 MG tablet Take 10 mg by mouth daily.     aspirin EC 81 MG tablet Take 81 mg by mouth daily. Swallow whole.     atorvastatin (LIPITOR) 40 MG tablet Take 40 mg by mouth every evening.     Calcium Carb-Cholecalciferol (CALCIUM + D3 PO) Take 1 capsule by mouth daily.     chlorthalidone (HYGROTON) 25 MG tablet TAKE 1 TABLET BY MOUTH EVERY DAY 90 tablet 2   Cholecalciferol (VITAMIN D) 50 MCG (2000 UT) tablet Take 2,000 Units by mouth daily.     EPINEPHrine 0.3 mg/0.3 mL IJ SOAJ injection Inject 0.3 mg into the muscle as needed for anaphylaxis.     ferrous sulfate 325 (65 FE) MG EC tablet Take 325 mg by mouth daily.     HYDROcodone-acetaminophen (NORCO) 10-325 MG tablet Take 1 tablet by mouth 2 (two) times daily as needed for severe pain.      magnesium oxide (MAG-OX) 400 (240 Mg) MG tablet Take 400 mg by mouth daily.     metoprolol succinate (TOPROL-XL) 25 MG 24 hr tablet Take 25 mg by mouth daily.     Multiple Vitamins-Minerals (MULTIVITAMIN WITH MINERALS) tablet Take 1 tablet by mouth daily.     pantoprazole (PROTONIX) 40 MG tablet Take 40 mg by mouth daily.     potassium chloride SA (KLOR-CON M) 20 MEQ tablet Take 20 mEq by mouth daily.     prednisoLONE acetate (PRED FORTE) 1 % ophthalmic suspension Place 1 drop into the left eye daily.     timolol (TIMOPTIC) 0.5 % ophthalmic solution Place 1 drop into the left eye 2 (two) times daily.     No current facility-administered medications for  this visit.     Past Medical History:  Diagnosis Date   Aortic stenosis    Echo 7/22: EF 60-65, no RWMA, GRII DD, normal RVSF, RVSP 36.6, mild LAE, mild MR, mild-moderate TR, mild AI, moderate aortic stenosis (mean 24 mmHg, Vmax 349 cm/s, DI 0.45) // Echo 12/2021: EF 60-65, no RWMA, mod asymmetric LVH, GR 1 DD, GLS -19.2, normal RVSF, mild elevated PASP (40.5), mild LAE, mild MR, mod TR, mild to mod AI, mod AS (mean 23 mmHg, V-max 305 cm/s, DI 0.39)   Arthritis    OA   Heart murmur    refer to cardiologist note from dr Dietrich Pates   Hyperlipidemia    Hypertension    Left ureteral calculus    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   CHB   Wears dentures    upper  and partial lower   Wears glasses     ROS:   All systems reviewed and negative except as noted in the HPI.   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, 2000   CYSTOSCOPY WITH URETEROSCOPY AND STENT PLACEMENT Left 01/07/2014   Procedure: CYSTOSCOPY WITH LEFT RETRGRADE, URETEROSCOPY , AND LASER LITHOTRIPSY STONE EXTRACTION AND LEFT STENT PLACEMENT;  Surgeon: Crist Fat, MD;  Location: [facility];  Service: Urology;  Laterality: Left;   ESOPHAGOGASTRODUODENOSCOPY N/A 05/19/2020   Procedure: ESOPHAGOGASTRODUODENOSCOPY (EGD);  Surgeon: Vida Rigger, MD;  Location: [facility];  Service: Endoscopy;  Laterality: N/A;   FOREIGN BODY REMOVAL Left 02/10/2020   Procedure: Removal of retained foreign body of left great toe;  Surgeon: Toni Arthurs, MD;  Location: [facility];  Service: Orthopedics;  Laterality: Left;    HOLMIUM LASER APPLICATION Left 01/07/2014   Procedure: HOLMIUM LASER APPLICATION;  Surgeon: Crist Fat, MD;  Location: [facility];   Service: Urology;  Laterality: Left;   PACEMAKER IMPLANT N/A 07/15/2018   Procedure: PACEMAKER IMPLANT;  Surgeon: Marinus Maw, MD;  Location: [facility];  Service: Cardiovascular;  Laterality: N/A;   REVISION TOTAL KNEE ARTHROPLASTY  left 08-01-2003/  right 02-20-02006   TOTAL KNEE ARTHROPLASTY  left 03-30-2003/  right 11-05-2004   post op left knee I & D arthroscopic lavage 04-20-2003   TRANSTHORACIC ECHOCARDIOGRAM  08-28-2011  dr Gunnar Fusi ross   moderate lvh/  ef 60-65%/  grade 2 diastolic dysfunction/ normal lv function / hyperdynamic lv with outflow murmur / turbulance through LVOT , mild SAM/ moderate lae/ moderate tr/  trivial mv & pv     Family History  Problem Relation Age of Onset   Hypertension Father    Hypertension Brother      Social History   Socioeconomic History   Marital status: Widowed    Spouse name: Not on file   Number of children: Not on file   Years of education: Not on file   Highest education level: Not on file  Occupational History   Not on file  Tobacco Use   Smoking status: Former    Types: Cigarettes    Quit date: 02/28/2022    Years since quitting: 0.2   Smokeless tobacco: Never   Tobacco comments:    per pt occasional smoker  Vaping Use   Vaping Use: Never used  Substance and Sexual Activity   Alcohol use: Yes    Alcohol/week: 3.0 standard drinks of alcohol    Types: 3 Glasses of wine per week    Comment: social   Drug use: No   Sexual activity: Not on file  Other Topics Concern   Not on file  Social History Narrative   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 Widowed - husband died 2011/10/28. Occasional smoking and Etoh.   Social Determinants of Health   Financial Resource Strain: Not on file  Food Insecurity: Not on file  Transportation Needs: Not on file  Physical Activity: Not on file  Stress: Not on file  Social Connections: Not on file  Intimate Partner Violence: Not on file     BP 116/64   Pulse  60   Ht 5\' 4"  (1.626 m)   Wt 174 lb 9.6 oz (79.2 kg)   BMI 29.97 kg/m   Physical Exam:  Well appearing NAD HEENT: Unremarkable Neck:  No JVD, no thyromegally Lymphatics:  No adenopathy Back:  No CVA tenderness Lungs:  Clear HEART:  Regular rate rhythm, no murmurs, no rubs, no clicks Abd:  soft, positive bowel sounds, no organomegally, no rebound, no  guarding Ext:  2 plus pulses, no edema, no cyanosis, no clubbing Skin:  No rashes no nodules Neuro:  CN II through XII intact, motor grossly intact  EKG - nsr with atrial pacing  DEVICE  Normal device function.  See PaceArt for details.   Assess/Plan: 1. Sinus node dysfunction  - she is asymptomatic, s/p PPM insertion. 2. NS Atrial tachycardia - she is asymptomatic. She will continue her current meds. Her histograms demonstrate minimal arrhythmia. 3. PPM - her medtronic DDD PM is working normally.  4. HTN -her bp is well controlled. She is encouraged to continue to lose weight. 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 Preop eval - she is low risk for upcoming surgery. She may proceed.   Leonia Reeves.D

## 2022-05-30 NOTE — Progress Notes (Signed)
PERIOPERATIVE PRESCRIPTION FOR IMPLANTED CARDIAC DEVICE PROGRAMMING  Patient Information: Name:  Kelsey [name]  DOB:  [birth year]  MRN:  [id number]    Planned Procedure:  Panniculectomy  Surgeon:  Bearl Mulberry  Date of Procedure:  06/03/22  Cautery will be used.  Position during surgery:  Supine   Please send documentation back to:  [facility] (Fax # [phone])  Device Information:  Clinic EP Physician:  [name], MD   Device Type:  Pacemaker Manufacturer and Phone #:  Medtronic: [phone] Pacemaker Dependent?:  No Date of Last Device Check:  05/30/2022 Normal Device Function?:  Yes.    Electrophysiologist's Recommendations:  Have magnet available. Provide continuous ECG monitoring when magnet is used or reprogramming is to be performed.  Procedure should not interfere with device function.  No device programming or magnet placement needed.  Per Device Clinic Standing Orders, Lenor Coffin, RN  11:04 AM 05/30/2022

## 2022-05-30 NOTE — Patient Instructions (Signed)
Medication Instructions:  NO CHANGES *If you need a refill on your cardiac medications before your next appointment, please call your pharmacy*   Lab Work: NONE If you have labs (blood work) drawn today and your tests are completely normal, you will receive your results only by: MyChart Message (if you have MyChart) OR A paper copy in the mail If you have any lab test that is abnormal or we need to change your treatment, we will call you to review the results.   Testing/Procedures: NONE   Follow-Up: At [facility], you and your health needs are our priority.  As part of our continuing mission to provide you with exceptional heart care, we have created designated Provider Care Teams.  These Care Teams include your primary Cardiologist (physician) and Advanced Practice Providers (APPs -  Physician Assistants and Nurse Practitioners) who all work together to provide you with the care you need, when you need it.  We recommend signing up for the patient portal called "MyChart".  Sign up information is provided on this After Visit Summary.  MyChart is used to connect with patients for Virtual Visits (Telemedicine).  Patients are able to view lab/test results, encounter notes, upcoming appointments, etc.  Non-urgent messages can be sent to your provider as well.   To learn more about what you can do with MyChart, go to [email or web address].    Your next appointment:   1 year(s)  The format for your next appointment:   In Person  Provider:  DR [name]  Other Instructions NONE  Important Information About Sugar

## 2022-05-30 NOTE — Anesthesia Preprocedure Evaluation (Addendum)
Anesthesia Evaluation  Patient identified by MRN, date of birth, ID band Patient awake    Reviewed: Allergy & Precautions, H&P , NPO status , Patient's Chart, lab work & pertinent test results  Airway Mallampati: II   Neck ROM: full    Dental   Pulmonary Patient abstained from smoking., former smoker   breath sounds clear to auscultation       Cardiovascular hypertension, + dysrhythmias + pacemaker + Valvular Problems/Murmurs AS  Rhythm:regular Rate:Normal  Moderate AS with mean PG 23 mmHg. Normal EF.   Neuro/Psych    GI/Hepatic   Endo/Other    Renal/GU      Musculoskeletal   Abdominal   Peds  Hematology   Anesthesia Other Findings   Reproductive/Obstetrics                             Anesthesia Physical Anesthesia Plan  ASA: 3  Anesthesia Plan: General   Post-op Pain Management:    Induction: Intravenous  PONV Risk Score and Plan: 3 and Ondansetron, Dexamethasone, Midazolam and Treatment may vary due to age or medical condition  Airway Management Planned: Oral ETT  Additional Equipment:   Intra-op Plan:   Post-operative Plan: Extubation in OR  Informed Consent: I have reviewed the patients History and Physical, chart, labs and discussed the procedure including the risks, benefits and alternatives for the proposed anesthesia with the patient or authorized representative who has indicated his/her understanding and acceptance.     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 Cardiac clearance per telephone encounter 02/27/22 by Dr. Harrington Challenger states, "I saw pt in clinic in Aug 2023 Doing well at that time I called  her today to confirm physical capabilitiesKnees limit her She is able to sweep and mop without difficulty She denies CP or SOB with these activities Walks about 4/10 of a mile OK No CP or SOB Again, after that knees limit From a cardiac standpoint she is performing over 4METS of activity I think she is at relatively low risk for major cardiac complication with surgery Watch fluids, watch BP. Do not feel she needs additional cardiac testing."  Patient seen by EP cardiologist Dr. Lovena Le 05/30/2022, noted to be doing well, [employer] DDD PM working normally, no changes to management.  Deemed low risk for upcoming surgery.  Preop labs reviewed, unremarkable.  EKG 05/30/2022 (read per Dr. Tanna Furry note same date, copy not yet scanned into epic): NSR with atrial pacing.  Perioperative prescription for implanted cardiac device programming per progress note 05/30/2022: Device Information:   Clinic EP Physician:  Cristopher Peru, MD    Device Type:  Pacemaker Manufacturer and Phone #:  [employer]: [phone] Pacemaker Dependent?:  No Date of Last Device Check:  05/30/2022     Normal Device Function?:  Yes.     Electrophysiologist's Recommendations:    Have magnet available.  Provide continuous ECG monitoring when magnet is used or reprogramming is to be performed.   Procedure should not interfere with device function.  No device programming or magnet placement needed.  TTE 01/01/22: 1. Left ventricular ejection fraction, by estimation, is 60 to 65%. The  left ventricle has normal function. The left ventricle has no regional  wall  motion abnormalities. There is moderate asymmetric left ventricular  hypertrophy of the basal-septal  segment. Left ventricular diastolic parameters are consistent with Grade I  diastolic dysfunction (impaired relaxation). The average left ventricular  global longitudinal strain is -19.2 %. The global longitudinal strain is  normal.  2. Right  ventricular systolic function is normal. The right ventricular  size is normal. There is mildly elevated pulmonary artery systolic  pressure. The estimated right ventricular systolic pressure is 40.5 mmHg.  3. Left atrial size was mildly dilated.  4. The mitral valve is grossly normal. Mild mitral valve regurgitation.  5. Tricuspid valve regurgitation is moderate.  6. The aortic valve is tricuspid. There is moderate calcification of the  aortic valve. There is moderate thickening of the aortic valve. Aortic  valve regurgitation is mild to moderate. Moderate aortic valve stenosis.  Aortic valve area, by VTI measures  1.11 cm. Aortic valve mean gradient measures 23.0 mmHg. Aortic valve Vmax  measures 3.05 m/s.  7. The inferior vena cava is normal in size with greater than 50%  respiratory variability, suggesting right atrial pressure of 3 mmHg.   Comparison(s): Compared to prior TTE 12/2020, there is no significant  change. Prior mean AoV gradient which is stable on current study.  AI now appears mild-to-moderate (previously mild).   Nuclear stress 08/01/2017:  Nuclear stress EF: 53%.  No T wave inversion was noted during stress.  There was no ST segment deviation noted during stress.  Defect 1: There is a medium defect of moderate severity.  This is a low risk study.   Medium size, moderate severity fixed inferoseptal/septal bowel attenuation artifact. No reversible ischemia. LVEF 53% with normal wall motion. This is a low risk study.    )        Anesthesia Quick Evaluation

## 2022-06-03 ENCOUNTER — Ambulatory Visit (HOSPITAL_COMMUNITY): Payer: Medicare Other | Admitting: Physician Assistant

## 2022-06-03 ENCOUNTER — Other Ambulatory Visit: Payer: Self-pay

## 2022-06-03 ENCOUNTER — Encounter (HOSPITAL_COMMUNITY): Payer: Self-pay | Admitting: Plastic Surgery

## 2022-06-03 ENCOUNTER — Encounter (HOSPITAL_COMMUNITY)
Admission: RE | Disposition: A | Discharge status: Home / Self Care | Payer: Self-pay | Source: Home / Self Care | Attending: Plastic Surgery

## 2022-06-03 ENCOUNTER — Ambulatory Visit (HOSPITAL_BASED_OUTPATIENT_CLINIC_OR_DEPARTMENT_OTHER): Payer: Medicare Other | Admitting: Physician Assistant

## 2022-06-03 ENCOUNTER — Observation Stay (HOSPITAL_COMMUNITY)
Admission: RE | Admit: 2022-06-03 | Discharge: 2022-06-04 | Disposition: A | Discharge status: Home / Self Care | Payer: Medicare Other | Attending: Plastic Surgery | Admitting: Plastic Surgery

## 2022-06-03 DIAGNOSIS — Z87891 Personal history of nicotine dependence: Secondary | ICD-10-CM

## 2022-06-03 DIAGNOSIS — I350 Nonrheumatic aortic (valve) stenosis: Secondary | ICD-10-CM | POA: Diagnosis not present

## 2022-06-03 DIAGNOSIS — Z950 Presence of cardiac pacemaker: Secondary | ICD-10-CM | POA: Insufficient documentation

## 2022-06-03 DIAGNOSIS — D638 Anemia in other chronic diseases classified elsewhere: Secondary | ICD-10-CM

## 2022-06-03 DIAGNOSIS — M793 Panniculitis, unspecified: Principal | ICD-10-CM | POA: Insufficient documentation

## 2022-06-03 DIAGNOSIS — I10 Essential (primary) hypertension: Secondary | ICD-10-CM | POA: Diagnosis not present

## 2022-06-03 HISTORY — PX: PANNICULECTOMY: SHX5360

## 2022-06-03 SURGERY — PANNICULECTOMY
Anesthesia: General | Site: Abdomen

## 2022-06-03 MED ORDER — FENTANYL CITRATE (PF) 100 MCG/2ML IJ SOLN
INTRAMUSCULAR | Status: AC
  Filled 2022-06-03: qty 2

## 2022-06-03 MED ORDER — SUGAMMADEX SODIUM 200 MG/2ML IV SOLN
INTRAVENOUS | Status: DC | PRN
  Administered 2022-06-03: 200 mg via INTRAVENOUS

## 2022-06-03 MED ORDER — BUPIVACAINE HCL (PF) 0.25 % IJ SOLN
INTRAMUSCULAR | Status: AC
  Filled 2022-06-03: qty 30

## 2022-06-03 MED ORDER — CHLORHEXIDINE GLUCONATE 0.12 % MT SOLN
15.0000 mL | Freq: Once | OROMUCOSAL | Status: AC
  Administered 2022-06-03: 15 mL via OROMUCOSAL
  Filled 2022-06-03: qty 15

## 2022-06-03 MED ORDER — KCL IN DEXTROSE-NACL 20-5-0.45 MEQ/L-%-% IV SOLN
INTRAVENOUS | Status: DC
  Filled 2022-06-03 (×2): qty 1000

## 2022-06-03 MED ORDER — LIDOCAINE 2% (20 MG/ML) 5 ML SYRINGE
INTRAMUSCULAR | Status: AC
  Filled 2022-06-03: qty 5

## 2022-06-03 MED ORDER — METOPROLOL SUCCINATE ER 25 MG PO TB24
25.0000 mg | ORAL_TABLET | Freq: Every day | ORAL | Status: DC
  Administered 2022-06-03: 25 mg via ORAL
  Filled 2022-06-03: qty 1

## 2022-06-03 MED ORDER — METHOCARBAMOL 500 MG PO TABS
ORAL_TABLET | ORAL | Status: AC
  Filled 2022-06-03: qty 1

## 2022-06-03 MED ORDER — ENOXAPARIN SODIUM 40 MG/0.4ML IJ SOSY
40.0000 mg | PREFILLED_SYRINGE | INTRAMUSCULAR | Status: DC
  Administered 2022-06-04: 40 mg via SUBCUTANEOUS
  Filled 2022-06-03: qty 0.4

## 2022-06-03 MED ORDER — BUPIVACAINE LIPOSOME 1.3 % IJ SUSP
INTRAMUSCULAR | Status: AC
  Filled 2022-06-03: qty 20

## 2022-06-03 MED ORDER — ATORVASTATIN CALCIUM 40 MG PO TABS
40.0000 mg | ORAL_TABLET | Freq: Every evening | ORAL | Status: DC
  Administered 2022-06-03: 40 mg via ORAL
  Filled 2022-06-03: qty 1

## 2022-06-03 MED ORDER — PHENYLEPHRINE 80 MCG/ML (10ML) SYRINGE FOR IV PUSH (FOR BLOOD PRESSURE SUPPORT)
PREFILLED_SYRINGE | INTRAVENOUS | Status: DC | PRN
  Administered 2022-06-03 (×4): 80 ug via INTRAVENOUS

## 2022-06-03 MED ORDER — AMLODIPINE BESYLATE 10 MG PO TABS: 10.0000 mg | ORAL_TABLET | Freq: Every day | ORAL | Status: DC

## 2022-06-03 MED ORDER — CHLORTHALIDONE 25 MG PO TABS
25.0000 mg | ORAL_TABLET | Freq: Every day | ORAL | Status: DC
  Administered 2022-06-03: 25 mg via ORAL
  Filled 2022-06-03 (×2): qty 1

## 2022-06-03 MED ORDER — ONDANSETRON 4 MG PO TBDP: 4.0000 mg | ORAL_TABLET | Freq: Four times a day (QID) | ORAL | Status: DC | PRN

## 2022-06-03 MED ORDER — ROCURONIUM BROMIDE 10 MG/ML (PF) SYRINGE
PREFILLED_SYRINGE | INTRAVENOUS | Status: DC | PRN
  Administered 2022-06-03: 15 mg via INTRAVENOUS
  Administered 2022-06-03: 65 mg via INTRAVENOUS

## 2022-06-03 MED ORDER — PANTOPRAZOLE SODIUM 40 MG PO TBEC
40.0000 mg | DELAYED_RELEASE_TABLET | Freq: Every day | ORAL | Status: DC
  Administered 2022-06-04: 40 mg via ORAL
  Filled 2022-06-03: qty 1

## 2022-06-03 MED ORDER — TRANEXAMIC ACID-NACL 1000-0.7 MG/100ML-% IV SOLN
1000.0000 mg | INTRAVENOUS | Status: AC
  Administered 2022-06-03: 1000 mg via INTRAVENOUS
  Filled 2022-06-03 (×2): qty 100

## 2022-06-03 MED ORDER — ONDANSETRON HCL 4 MG/2ML IJ SOLN
INTRAMUSCULAR | Status: DC | PRN
  Administered 2022-06-03: 4 mg via INTRAVENOUS

## 2022-06-03 MED ORDER — METHOCARBAMOL 500 MG PO TABS
500.0000 mg | ORAL_TABLET | Freq: Four times a day (QID) | ORAL | Status: DC | PRN
  Administered 2022-06-03: 500 mg via ORAL

## 2022-06-03 MED ORDER — CEFAZOLIN SODIUM-DEXTROSE 2-4 GM/100ML-% IV SOLN
2.0000 g | INTRAVENOUS | Status: AC
  Administered 2022-06-03: 2 g via INTRAVENOUS
  Filled 2022-06-03: qty 100

## 2022-06-03 MED ORDER — MAGNESIUM OXIDE -MG SUPPLEMENT 400 (240 MG) MG PO TABS
400.0000 mg | ORAL_TABLET | Freq: Every day | ORAL | Status: DC
  Administered 2022-06-04: 400 mg via ORAL
  Filled 2022-06-03: qty 1

## 2022-06-03 MED ORDER — KETOROLAC TROMETHAMINE 15 MG/ML IJ SOLN
15.0000 mg | Freq: Three times a day (TID) | INTRAMUSCULAR | Status: AC
  Administered 2022-06-03 – 2022-06-04 (×3): 15 mg via INTRAVENOUS
  Filled 2022-06-03 (×3): qty 1

## 2022-06-03 MED ORDER — HYDROMORPHONE HCL 1 MG/ML IJ SOLN
0.5000 mg | INTRAMUSCULAR | Status: DC | PRN
  Administered 2022-06-03: 0.5 mg via INTRAVENOUS

## 2022-06-03 MED ORDER — PROPOFOL 10 MG/ML IV BOLUS
INTRAVENOUS | Status: DC | PRN
  Administered 2022-06-03: 130 mg via INTRAVENOUS

## 2022-06-03 MED ORDER — PHENYLEPHRINE HCL-NACL 20-0.9 MG/250ML-% IV SOLN
INTRAVENOUS | Status: DC | PRN
  Administered 2022-06-03: 15 ug/min via INTRAVENOUS

## 2022-06-03 MED ORDER — PREDNISOLONE ACETATE 1 % OP SUSP
1.0000 [drp] | Freq: Every day | OPHTHALMIC | Status: DC
  Administered 2022-06-04: 1 [drp] via OPHTHALMIC
  Filled 2022-06-03: qty 5

## 2022-06-03 MED ORDER — ONDANSETRON HCL 4 MG/2ML IJ SOLN
INTRAMUSCULAR | Status: AC
  Filled 2022-06-03: qty 2

## 2022-06-03 MED ORDER — ONDANSETRON HCL 4 MG/2ML IJ SOLN
4.0000 mg | Freq: Four times a day (QID) | INTRAMUSCULAR | Status: DC | PRN
  Administered 2022-06-04: 4 mg via INTRAVENOUS
  Filled 2022-06-03: qty 2

## 2022-06-03 MED ORDER — POTASSIUM CHLORIDE CRYS ER 20 MEQ PO TBCR
20.0000 meq | EXTENDED_RELEASE_TABLET | Freq: Every day | ORAL | Status: DC
  Administered 2022-06-03 – 2022-06-04 (×2): 20 meq via ORAL
  Filled 2022-06-03 (×2): qty 1

## 2022-06-03 MED ORDER — HYDROCODONE-ACETAMINOPHEN 5-325 MG PO TABS
1.0000 | ORAL_TABLET | ORAL | Status: DC | PRN
  Administered 2022-06-03 – 2022-06-04 (×3): 1 via ORAL
  Filled 2022-06-03 (×3): qty 1

## 2022-06-03 MED ORDER — FENTANYL CITRATE (PF) 100 MCG/2ML IJ SOLN
INTRAMUSCULAR | Status: DC | PRN
  Administered 2022-06-03: 100 ug via INTRAVENOUS
  Administered 2022-06-03 (×2): 50 ug via INTRAVENOUS

## 2022-06-03 MED ORDER — LIDOCAINE 2% (20 MG/ML) 5 ML SYRINGE
INTRAMUSCULAR | Status: DC | PRN
  Administered 2022-06-03: 60 mg via INTRAVENOUS

## 2022-06-03 MED ORDER — 0.9 % SODIUM CHLORIDE (POUR BTL) OPTIME
TOPICAL | Status: DC | PRN
  Administered 2022-06-03: 1000 mL

## 2022-06-03 MED ORDER — ACETAMINOPHEN 500 MG PO TABS
1000.0000 mg | ORAL_TABLET | ORAL | Status: AC
  Administered 2022-06-03: 1000 mg via ORAL
  Filled 2022-06-03: qty 2

## 2022-06-03 MED ORDER — ORAL CARE MOUTH RINSE: 15.0000 mL | Freq: Once | OROMUCOSAL | Status: AC

## 2022-06-03 MED ORDER — FENTANYL CITRATE (PF) 250 MCG/5ML IJ SOLN
INTRAMUSCULAR | Status: AC
  Filled 2022-06-03: qty 5

## 2022-06-03 MED ORDER — CHLORHEXIDINE GLUCONATE CLOTH 2 % EX PADS: 6.0000 | MEDICATED_PAD | Freq: Once | CUTANEOUS | Status: DC

## 2022-06-03 MED ORDER — HYDROMORPHONE HCL 1 MG/ML IJ SOLN
INTRAMUSCULAR | Status: AC
  Filled 2022-06-03: qty 1

## 2022-06-03 MED ORDER — FENTANYL CITRATE (PF) 100 MCG/2ML IJ SOLN
25.0000 ug | INTRAMUSCULAR | Status: DC | PRN
  Administered 2022-06-03 (×3): 25 ug via INTRAVENOUS

## 2022-06-03 MED ORDER — DEXAMETHASONE SODIUM PHOSPHATE 10 MG/ML IJ SOLN
INTRAMUSCULAR | Status: AC
  Filled 2022-06-03: qty 1

## 2022-06-03 MED ORDER — WATER FOR IRRIGATION, STERILE IR SOLN
Status: DC | PRN
  Administered 2022-06-03: 1000 mL

## 2022-06-03 MED ORDER — ONDANSETRON HCL 4 MG/2ML IJ SOLN: 4.0000 mg | Freq: Four times a day (QID) | INTRAMUSCULAR | Status: DC | PRN

## 2022-06-03 MED ORDER — LACTATED RINGERS IV SOLN: INTRAVENOUS | Status: DC

## 2022-06-03 MED ORDER — PROPOFOL 10 MG/ML IV BOLUS
INTRAVENOUS | Status: AC
  Filled 2022-06-03: qty 20

## 2022-06-03 MED ORDER — ROCURONIUM BROMIDE 10 MG/ML (PF) SYRINGE
PREFILLED_SYRINGE | INTRAVENOUS | Status: AC
  Filled 2022-06-03: qty 10

## 2022-06-03 MED ORDER — BUPIVACAINE HCL 0.25 % IJ SOLN
INTRAMUSCULAR | Status: DC | PRN
  Administered 2022-06-03: 30 mL

## 2022-06-03 MED ORDER — DEXAMETHASONE SODIUM PHOSPHATE 10 MG/ML IJ SOLN
INTRAMUSCULAR | Status: DC | PRN
  Administered 2022-06-03: 5 mg via INTRAVENOUS

## 2022-06-03 MED ORDER — TIMOLOL MALEATE 0.5 % OP SOLN
1.0000 [drp] | Freq: Two times a day (BID) | OPHTHALMIC | Status: DC
  Administered 2022-06-03 – 2022-06-04 (×2): 1 [drp] via OPHTHALMIC
  Filled 2022-06-03: qty 5

## 2022-06-03 SURGICAL SUPPLY — 60 items
ADH SKN CLS APL DERMABOND .7 (GAUZE/BANDAGES/DRESSINGS) ×4
APL PRP STRL LF DISP 70% ISPRP (MISCELLANEOUS) ×2
APPLIER CLIP 9.375 MED OPEN (MISCELLANEOUS) ×3
APR CLP MED 9.3 20 MLT OPN (MISCELLANEOUS) ×3
BAG COUNTER SPONGE SURGICOUNT (BAG) IMPLANT
BAG SPNG CNTER NS LX DISP (BAG) ×1
BINDER ABDOMINAL 12 ML 46-62 (SOFTGOODS) ×1 IMPLANT
BINDER ABDOMINAL 12 XL 75-84 (SOFTGOODS) ×1 IMPLANT
BLADE CLIPPER SURG (BLADE) IMPLANT
BLADE SURG 10 STRL SS (BLADE) ×2 IMPLANT
BLADE SURG 11 STRL SS (BLADE) ×1 IMPLANT
BLADE SURG 15 STRL LF DISP TIS (BLADE) IMPLANT
BLADE SURG 15 STRL SS (BLADE)
CHLORAPREP W/TINT 26 (MISCELLANEOUS) ×1 IMPLANT
CLIP APPLIE 9.375 MED OPEN (MISCELLANEOUS) ×1 IMPLANT
DERMABOND ADVANCED .7 DNX12 (GAUZE/BANDAGES/DRESSINGS) ×2 IMPLANT
DRAIN CHANNEL 15F RND FF W/TCR (WOUND CARE) ×2 IMPLANT
DRAIN CHANNEL 19F RND (DRAIN) IMPLANT
DRAPE HALF SHEET 40X57 (DRAPES) ×2 IMPLANT
DRAPE TOP ARMCOVERS (MISCELLANEOUS) ×1 IMPLANT
DRAPE U-SHAPE 76X120 STRL (DRAPES) ×1 IMPLANT
DRAPE UTILITY XL STRL (DRAPES) IMPLANT
ELECT BLADE 4.0 EZ CLEAN MEGAD (MISCELLANEOUS)
ELECT COATED BLADE 2.86 ST (ELECTRODE) ×1 IMPLANT
ELECT REM PT RETURN 9FT ADLT (ELECTROSURGICAL) ×1
ELECTRODE BLDE 4.0 EZ CLN MEGD (MISCELLANEOUS) IMPLANT
ELECTRODE REM PT RTRN 9FT ADLT (ELECTROSURGICAL) ×1 IMPLANT
EVACUATOR SILICONE 100CC (DRAIN) ×2 IMPLANT
GAUZE PAD ABD 8X10 STRL (GAUZE/BANDAGES/DRESSINGS) ×2 IMPLANT
GAUZE XEROFORM 1X8 LF (GAUZE/BANDAGES/DRESSINGS) IMPLANT
GLOVE BIO SURGEON STRL SZ 6 (GLOVE) ×3 IMPLANT
GOWN STRL REUS W/ TWL LRG LVL3 (GOWN DISPOSABLE) ×3 IMPLANT
GOWN STRL REUS W/TWL LRG LVL3 (GOWN DISPOSABLE) ×3
KIT BASIN OR (CUSTOM PROCEDURE TRAY) ×1 IMPLANT
KIT TURNOVER KIT B (KITS) ×1 IMPLANT
MARKER SKIN DUAL TIP RULER LAB (MISCELLANEOUS) ×1 IMPLANT
NDL HYPO 25GX1X1/2 BEV (NEEDLE) IMPLANT
NDL HYPO 25X1 1.5 SAFETY (NEEDLE) IMPLANT
NEEDLE HYPO 25GX1X1/2 BEV (NEEDLE) ×1 IMPLANT
NEEDLE HYPO 25X1 1.5 SAFETY (NEEDLE) IMPLANT
NS IRRIG 1000ML POUR BTL (IV SOLUTION) ×1 IMPLANT
PACK GENERAL/GYN (CUSTOM PROCEDURE TRAY) ×1 IMPLANT
PENCIL SMOKE EVACUATOR (MISCELLANEOUS) ×1 IMPLANT
PIN SAFETY STERILE (MISCELLANEOUS) ×1 IMPLANT
SPONGE T-LAP 18X18 ~~LOC~~+RFID (SPONGE) ×2 IMPLANT
SUT DVC VLOC 180 0 12IN GS21 (SUTURE)
SUT ETHILON 2 0 FS 18 (SUTURE) ×2 IMPLANT
SUT MNCRL AB 4-0 PS2 18 (SUTURE) ×2 IMPLANT
SUT PDS AB 0 CT1 27 (SUTURE) ×4 IMPLANT
SUT PLAIN 5 0 P 3 18 (SUTURE) IMPLANT
SUT VLOC 180 0 24IN GS25 (SUTURE) IMPLANT
SUTURE DVC VLC 180 0 12IN GS21 (SUTURE) IMPLANT
SYR CONTROL 10ML LL (SYRINGE) IMPLANT
TOWEL GREEN STERILE FF (TOWEL DISPOSABLE) ×1 IMPLANT
TRAY FOL W/BAG SLVR 16FR STRL (SET/KITS/TRAYS/PACK) IMPLANT
TRAY FOLEY W/BAG SLVR 16FR (SET/KITS/TRAYS/PACK)
TRAY FOLEY W/BAG SLVR 16FR LF (SET/KITS/TRAYS/PACK)
TRAY FOLEY W/BAG SLVR 16FR ST (SET/KITS/TRAYS/PACK) IMPLANT
UNDERPAD 30X36 HEAVY ABSORB (UNDERPADS AND DIAPERS) ×2 IMPLANT
WATER STERILE IRR 1000ML POUR (IV SOLUTION) ×1 IMPLANT

## 2022-06-03 NOTE — Progress Notes (Signed)
Called [employer] to make representative aware of patient's surgery @ 0715 this am.

## 2022-06-03 NOTE — Transfer of Care (Signed)
Immediate Anesthesia Transfer of Care Note  Patient: KATYA ROLSTON  Procedure(s) Performed: PANNICULECTOMY (Abdomen)  Patient Location: PACU  Anesthesia Type:General  Level of Consciousness: drowsy  Airway & Oxygen Therapy: Patient Spontanous Breathing and Patient connected to face mask oxygen  Post-op Assessment: Report given to RN  Post vital signs: Reviewed and stable  Last Vitals:  Vitals Value Taken Time  BP 136/75 06/03/22 1011  Temp    Pulse 66 06/03/22 1013  Resp 15 06/03/22 1013  SpO2 95 % 06/03/22 1013  Vitals shown include unvalidated device data.  Last Pain:  Vitals:   06/03/22 0627  TempSrc:   PainSc: 0-No pain         Complications: No notable events documented.

## 2022-06-03 NOTE — Progress Notes (Signed)
Asked Dr. Chaney Malling if rep was needed to reprogram pacemaker. He is okay with using a magnet during the procedure today.

## 2022-06-03 NOTE — Anesthesia Procedure Notes (Signed)
Procedure Name: Intubation Date/Time: 06/03/2022 7:23 AM  Performed by: Barrington Ellison, CRNAPre-anesthesia Checklist: Patient identified, Emergency Drugs available, Suction available and Patient being monitored Patient Re-evaluated:Patient Re-evaluated prior to induction Oxygen Delivery Method: Circle System Utilized Preoxygenation: Pre-oxygenation with 100% oxygen Induction Type: IV induction Ventilation: Mask ventilation without difficulty Laryngoscope Size: Mac and 3 Grade View: Grade I Tube type: Oral Tube size: 7.0 mm Number of attempts: 1 Airway Equipment and Method: Stylet and Oral airway Placement Confirmation: ETT inserted through vocal cords under direct vision, positive ETCO2 and breath sounds checked- equal and bilateral Secured at: 21 cm Tube secured with: Tape Dental Injury: Teeth and Oropharynx as per pre-operative assessment

## 2022-06-03 NOTE — Interval H&P Note (Signed)
History and Physical Interval Note:  06/03/2022 6:48 AM  Kelsey [name]  has presented today for surgery, with the diagnosis of panniculitis.  The various methods of treatment have been discussed with the patient and family. After consideration of risks, benefits and other options for treatment, the patient has consented to  Procedure(s): PANNICULECTOMY (N/A) as a surgical intervention.  The patient's history has been reviewed, patient examined, no change in status, stable for surgery.  I have reviewed the patient's chart and labs.  Questions were answered to the patient's satisfaction.     Irean Hong Nehan Flaum

## 2022-06-03 NOTE — Op Note (Signed)
Operative Note   DATE OF OPERATION: 12.18.23  LOCATION: [facility]-observation  SURGICAL DIVISION: Plastic Surgery  PREOPERATIVE DIAGNOSES:  Panniculitis  POSTOPERATIVE DIAGNOSES:  same  PROCEDURE:  Panniculectomy  SURGEON: Glenna Fellows MD MBA  ASSISTANT: Tarry Kos RNFA  ANESTHESIA:  General.   EBL: 40 ml  COMPLICATIONS: None immediate.   INDICATIONS FOR PROCEDURE:  The patient, Kelsey [name], is a 76 y.o. female born on [birth year], is here for treatment chronic panniculitis that has failed conservative measures.    FINDINGS: Soft tissue resection 3086 g  DESCRIPTION OF PROCEDURE:  The patient's caudal incision was marked over abdomen marked 7 cm from labial fourchette and extended over lateral abdomen. The patient was taken to the operating room. SCDs were placed and IV antibiotics were given. The patient's operative site was prepped and draped in a sterile fashion. A time out was performed and all information was confirmed to be correct.     Low transverse abdominal incision carried through superficial fascia to abdominal wall. Skin flap elevated in sub Scarpa's layer, taking care to leave layer of subfascial fat over abdominal wall fascia. Dissection completed toward umbilicus. Umbilicus sharply incised and scissor dissection completed to free from abdominal skin flap. Additional dissection completed in midline toward xiphoid. Wound irrigated and hemostasis obtained. Local anesthetic infiltrated. Caudal extent skin excision marked by palpation. Area marked excised. 19 Fr JP placed in subcutaneous right and left abdomen and secured with 2-0 nylon. Superiorly based U shaped skin flap incised for delivery umbilicus. Low transverse abdominal skin incision closed with 0 PDS in superficial fascia. 0 V lock used to close dermis and 4-0 monocryl for subcuticular skin closure. Umbilicus inset with 4-0 monocryl in dermis and 5-0 plain gut for skin closure. Xeroform bolster placed  within umbilicus. Dermabond applied.   Dry dressing and abdominal binder applied. The patient was allowed to wake from anesthesia, extubated and taken to the recovery room in satisfactory condition.   SPECIMENS: none  DRAINS: 19 Fr JP in right and left subcutaneous abdomen  Glenna Fellows, MD [facility] Plastic & Reconstructive Surgery  Office/ physician access line after hours [phone]

## 2022-06-03 NOTE — Anesthesia Postprocedure Evaluation (Signed)
Anesthesia Post Note  Patient: GERI HEPLER  Procedure(s) Performed: PANNICULECTOMY (Abdomen)     Patient location during evaluation: PACU Anesthesia Type: General Level of consciousness: awake and alert Pain management: pain level controlled Vital Signs Assessment: post-procedure vital signs reviewed and stable Respiratory status: spontaneous breathing, nonlabored ventilation, respiratory function stable and patient connected to nasal cannula oxygen Cardiovascular status: blood pressure returned to baseline and stable Postop Assessment: no apparent nausea or vomiting Anesthetic complications: no   No notable events documented.  Last Vitals:  Vitals:   06/03/22 1030 06/03/22 1045  BP: 128/71 126/61  Pulse: 68 65  Resp: 14 15  Temp:    SpO2: 97% 98%    Last Pain:  Vitals:   06/03/22 1045  TempSrc:   PainSc: Asleep                 Fred Franzen S

## 2022-06-04 ENCOUNTER — Encounter (HOSPITAL_COMMUNITY): Payer: Self-pay | Admitting: Plastic Surgery

## 2022-06-04 DIAGNOSIS — M793 Panniculitis, unspecified: Secondary | ICD-10-CM | POA: Diagnosis not present

## 2022-06-04 MED ORDER — SODIUM CHLORIDE 0.9 % IV SOLN: INTRAVENOUS | Status: DC

## 2022-06-04 MED ORDER — SODIUM CHLORIDE 0.9 % IV SOLN
12.5000 mg | Freq: Four times a day (QID) | INTRAVENOUS | Status: DC | PRN
  Administered 2022-06-04: 12.5 mg via INTRAVENOUS
  Filled 2022-06-04: qty 12.5

## 2022-06-04 MED ORDER — SODIUM CHLORIDE 0.9 % IV BOLUS
1000.0000 mL | Freq: Once | INTRAVENOUS | Status: AC
  Administered 2022-06-04: 1000 mL via INTRAVENOUS

## 2022-06-04 NOTE — Discharge Summary (Signed)
Physician Discharge Summary  Patient ID: Kelsey [name] MRN: [id number] DOB/AGE: Feb 03, 1946 76 y.o.  Admit date: 06/03/2022 Discharge date: 06/04/2022  Admission Diagnoses: Panniculitis  Discharge Diagnoses:  Principal Problem:   Panniculitis   Discharged Condition: stable  Hospital Course: Post operatively patient had nausea and emesis overnight POD#0. She also had hypotension with reported dizziness. She responded to hydration and anti emetic medication. She was able to ambulate and tolerate diet on POD#1. Patient is on pain contract and declined additional pain medication at time of discharge.   Treatments: surgery: panniculectomy 12.18.23  Discharge Exam: Blood pressure (!) 81/44, pulse 65, temperature 98.7 F (37.1 C), temperature source Oral, resp. rate 18, height 5\' 4"  (1.626 m), weight 78.5 kg, SpO2 98 %. Incision/Wound: abdomen soft JPs serosanguinous incisions intact  Disposition: Discharge disposition: 01-Home or Self Care       Discharge Instructions     Call MD for:  redness, tenderness, or signs of infection (pain, swelling, bleeding, redness, odor or green/yellow discharge around incision site)   Complete by: As directed    Call MD for:  temperature >100.5   Complete by: As directed    Discharge instructions   Complete by: As directed    Ok to remove dressings and shower am 12.20.23. Soap and water ok, pat incisions dry. No creams or ointments over incisions. Do not let drains dangle in shower, attach to lanyard or similar.Strip and record drains twice daily and bring log to clinic visit.  Abdominal binder or compression garment all other times.  Ok to raise arms above shoulders for bathing and dressing.  No house yard work or exercise until cleared by MD.   Recommend Miralax or Dulcolax as needed for constipation.  Ambulate bent at hip. Sleep/recline with head elevated on 2-3 pillows and pillow beneath knees.   Driving Restrictions   Complete by:  As directed    No driving through follow up visit   Lifting restrictions   Complete by: As directed    No lifting > 5-10 lbs until cleared by MD   Resume previous diet   Complete by: As directed       Allergies as of 06/04/2022       Reactions   Bee Venom Anaphylaxis   ALL BEE STINGS   Oxycodone-acetaminophen Itching        Medication List     TAKE these medications    amLODipine 10 MG tablet Commonly known as: NORVASC Take 10 mg by mouth daily.   aspirin EC 81 MG tablet Take 81 mg by mouth daily. Swallow whole.   atorvastatin 40 MG tablet Commonly known as: LIPITOR Take 40 mg by mouth every evening.   CALCIUM + D3 PO Take 1 capsule by mouth daily.   chlorthalidone 25 MG tablet Commonly known as: HYGROTON TAKE 1 TABLET BY MOUTH EVERY DAY   EPINEPHrine 0.3 mg/0.3 mL Soaj injection Commonly known as: EPI-PEN Inject 0.3 mg into the muscle as needed for anaphylaxis.   ferrous sulfate 325 (65 FE) MG EC tablet Take 325 mg by mouth daily.   HYDROcodone-acetaminophen 10-325 MG tablet Commonly known as: NORCO Take 1 tablet by mouth 2 (two) times daily as needed for severe pain.   magnesium oxide 400 (240 Mg) MG tablet Commonly known as: MAG-OX Take 400 mg by mouth daily.   metoprolol succinate 25 MG 24 hr tablet Commonly known as: TOPROL-XL Take 25 mg by mouth daily.   multivitamin with minerals tablet Take 1 tablet by  mouth daily.   pantoprazole 40 MG tablet Commonly known as: PROTONIX Take 40 mg by mouth daily.   potassium chloride SA 20 MEQ tablet Commonly known as: KLOR-CON M Take 20 mEq by mouth daily.   prednisoLONE acetate 1 % ophthalmic suspension Commonly known as: PRED FORTE Place 1 drop into the left eye daily.   timolol 0.5 % ophthalmic solution Commonly known as: TIMOPTIC Place 1 drop into the left eye 2 (two) times daily.   Vitamin D 50 MCG (2000 UT) tablet Take 2,000 Units by mouth daily.        Follow-up Information      Glenna Fellows, MD Follow up in 1 week(s).   Specialty: Plastic Surgery Why: as scheduled Contact information: [address] [place] [address] [phone]                 Signed: Glenna Fellows 06/04/2022, 1:08 PM

## 2022-06-04 NOTE — Progress Notes (Signed)
Plastic Surgery POD# 1 panniculectomy  Emesis and nausea overnight Bolus ordered for hypotension Received Toprol yesterday post op OOB to BR once  Temp:  [97.7 F (36.5 C)-98.6 F (37 C)] 98.1 F (36.7 C) (12/19 0013) Pulse Rate:  [59-86] 59 (12/19 0013) Resp:  [12-20] 17 (12/18 1744) BP: (93-139)/(52-86) 102/60 (12/19 0013) SpO2:  [94 %-100 %] 98 % (12/19 0013)   JPs 100/52 UO 1050  PE Alert NAD Abd soft incisions intact  Bloody drainage from umbilicus but inset intact    A/P Continue IVF  Hold BP meds Phenergan for nausea not improved after Zofran Will check this pm- home when able to tolerate diet and ambulate  Glenna Fellows, MD [facility] Plastic & Reconstructive Surgery  Office/ physician access line after hours [phone]

## 2022-06-04 NOTE — Plan of Care (Signed)

## 2022-06-04 NOTE — Plan of Care (Signed)
Per MD, ok to dc patient if patient ok to go, patient tolerated lunch.  INT to RAC discontinued, tip intact, no excessive bleeding noted.  Dressing applied.  Educated on s/s of infection, documenting JP drain amounts BID and taking to 1st post op appt. Instructed how to empty JP drain and compressing for continuous suction.  Meds per orders and instructed to check BP prior to administration of HTN meds.  Both patient and daughter verbalized understanding. RN using wheelchair to daughter for discharge. NADN. Sreece, RN

## 2022-06-07 ENCOUNTER — Telehealth: Payer: Self-pay | Admitting: Internal Medicine

## 2022-06-07 NOTE — Telephone Encounter (Signed)
Pt c/o Shortness Of Breath: STAT if SOB developed within the last 24 hours or pt is noticeably SOB on the phone  1. Are you currently SOB (can you hear that pt is SOB on the phone)? no  2. How long have you been experiencing SOB? Since she has been home after her surgery 12/18  3. Are you SOB when sitting or when up moving around? Moving around  4. Are you currently experiencing any other symptoms? fatigue  Patient states she had a surgery 12/18 and since she has been home she has been having increased SOB when moving. She says she is also much more tired. She says she will get exhausted after the shower.

## 2022-06-07 NOTE — Telephone Encounter (Signed)
Spoke with Kelsey [name] and had stomach sx on 12/18 and was discharged on 12/19 Per Kelsey [name] has noted SOB since procedure but does note improvement since Tues and Wed Kelsey [name] encouraged to monitor and if worsens to go to ED  for eval and tx.Kelsey [name] verbalized understanding . Will forward to Dr Ladona Ridgel for review./cy

## 2022-06-10 NOTE — Telephone Encounter (Signed)
I am out unti after the first of the year. Ask her to go to ER if sob gets worse. If possible followup with PA first week of January.

## 2022-06-11 NOTE — Telephone Encounter (Signed)
Spoke with pt and is feeling much better Per pt will monitor and call back if develops SOB .Follow up appt made with Dr Tenny Craw for 07/09/22 at 10:00 am

## 2022-06-15 ENCOUNTER — Emergency Department (HOSPITAL_BASED_OUTPATIENT_CLINIC_OR_DEPARTMENT_OTHER): Payer: Medicare Other

## 2022-06-15 ENCOUNTER — Other Ambulatory Visit: Payer: Self-pay

## 2022-06-15 ENCOUNTER — Emergency Department (HOSPITAL_BASED_OUTPATIENT_CLINIC_OR_DEPARTMENT_OTHER)
Admission: EM | Admit: 2022-06-15 | Discharge: 2022-06-15 | Disposition: A | Discharge status: Home / Self Care | Payer: Medicare Other | Attending: Emergency Medicine | Admitting: Emergency Medicine

## 2022-06-15 ENCOUNTER — Encounter (HOSPITAL_BASED_OUTPATIENT_CLINIC_OR_DEPARTMENT_OTHER): Payer: Self-pay | Admitting: Urology

## 2022-06-15 DIAGNOSIS — Y838 Other surgical procedures as the cause of abnormal reaction of the patient, or of later complication, without mention of misadventure at the time of the procedure: Secondary | ICD-10-CM | POA: Diagnosis not present

## 2022-06-15 DIAGNOSIS — T8149XA Infection following a procedure, other surgical site, initial encounter: Secondary | ICD-10-CM | POA: Insufficient documentation

## 2022-06-15 DIAGNOSIS — L03311 Cellulitis of abdominal wall: Secondary | ICD-10-CM | POA: Insufficient documentation

## 2022-06-15 DIAGNOSIS — L0390 Cellulitis, unspecified: Secondary | ICD-10-CM

## 2022-06-15 DIAGNOSIS — R1084 Generalized abdominal pain: Secondary | ICD-10-CM

## 2022-06-15 LAB — CBC WITH DIFFERENTIAL/PLATELET
Abs Immature Granulocytes: 0.06 10*3/uL (ref 0.00–0.07)
Basophils Absolute: 0.1 10*3/uL (ref 0.0–0.1)
Basophils Relative: 1 %
Eosinophils Absolute: 0.2 10*3/uL (ref 0.0–0.5)
Eosinophils Relative: 2 %
HCT: 22.6 % — ABNORMAL LOW (ref 36.0–46.0)
Hemoglobin: 7.1 g/dL — ABNORMAL LOW (ref 12.0–15.0)
Immature Granulocytes: 1 %
Lymphocytes Relative: 28 %
Lymphs Abs: 2.6 10*3/uL (ref 0.7–4.0)
MCH: 28 pg (ref 26.0–34.0)
MCHC: 31.4 g/dL (ref 30.0–36.0)
MCV: 89 fL (ref 80.0–100.0)
Monocytes Absolute: 1.3 10*3/uL — ABNORMAL HIGH (ref 0.1–1.0)
Monocytes Relative: 14 %
Neutro Abs: 5.2 10*3/uL (ref 1.7–7.7)
Neutrophils Relative %: 54 %
Platelets: 566 10*3/uL — ABNORMAL HIGH (ref 150–400)
RBC: 2.54 MIL/uL — ABNORMAL LOW (ref 3.87–5.11)
RDW: 15.9 % — ABNORMAL HIGH (ref 11.5–15.5)
WBC: 9.3 10*3/uL (ref 4.0–10.5)
nRBC: 0 % (ref 0.0–0.2)

## 2022-06-15 LAB — SEDIMENTATION RATE: Sed Rate: 95 mm/hr — ABNORMAL HIGH (ref 0–22)

## 2022-06-15 LAB — COMPREHENSIVE METABOLIC PANEL
ALT: 11 U/L (ref 0–44)
AST: 18 U/L (ref 15–41)
Albumin: 3 g/dL — ABNORMAL LOW (ref 3.5–5.0)
Alkaline Phosphatase: 94 U/L (ref 38–126)
Anion gap: 7 (ref 5–15)
BUN: 20 mg/dL (ref 8–23)
CO2: 27 mmol/L (ref 22–32)
Calcium: 9.2 mg/dL (ref 8.9–10.3)
Chloride: 103 mmol/L (ref 98–111)
Creatinine, Ser: 1.23 mg/dL — ABNORMAL HIGH (ref 0.44–1.00)
GFR, Estimated: 46 mL/min — ABNORMAL LOW (ref >60)
Glucose, Bld: 114 mg/dL — ABNORMAL HIGH (ref 70–99)
Potassium: 3.7 mmol/L (ref 3.5–5.1)
Sodium: 137 mmol/L (ref 135–145)
Total Bilirubin: 0.6 mg/dL (ref 0.3–1.2)
Total Protein: 7.1 g/dL (ref 6.5–8.1)

## 2022-06-15 MED ORDER — IOHEXOL 300 MG/ML  SOLN
85.0000 mL | Freq: Once | INTRAMUSCULAR | Status: AC | PRN
  Administered 2022-06-15: 85 mL via INTRAVENOUS

## 2022-06-15 MED ORDER — SODIUM CHLORIDE 0.9 % IV SOLN
1.0000 g | Freq: Once | INTRAVENOUS | Status: AC
  Administered 2022-06-15: 1 g via INTRAVENOUS
  Filled 2022-06-15: qty 10

## 2022-06-15 MED ORDER — AMOXICILLIN-POT CLAVULANATE 875-125 MG PO TABS: 1.0000 | ORAL_TABLET | Freq: Two times a day (BID) | ORAL | 0 refills | Status: DC

## 2022-06-15 MED ORDER — HYDROCODONE-ACETAMINOPHEN 5-325 MG PO TABS
1.0000 | ORAL_TABLET | Freq: Once | ORAL | Status: AC
  Administered 2022-06-15: 1 via ORAL
  Filled 2022-06-15: qty 1

## 2022-06-15 NOTE — ED Notes (Addendum)
D/c paperwork reviewed with pt, including prescriptions and follow up care. Pt verbalized understanding, Wheeled to ED exit by family  NAD.  All questions addressed prior to d/c.

## 2022-06-15 NOTE — ED Notes (Signed)
Pt ambulatory to bathroom, standby assistance.  

## 2022-06-15 NOTE — ED Provider Notes (Signed)
[facility] EMERGENCY DEPARTMENT Provider Note   CSN: [id number] Arrival date & time: 06/15/22  1637     History Chief Complaint  Patient presents with   Post-op Problem    HPI Kelsey [name] is a 76 y.o. female presenting for continued wound leakage from her abdominal wound.  Recent panniculectomy.  Per chart review, some continued wound drainage and dehiscence was noted in clinic 2 days ago but has been progressive.  Wound is continuing to drainage profusely.  She denies fevers or chills, nausea vomiting, syncope shortness of breath.  She is otherwise ambulatory tolerating p.o. intake.  No known sick contacts.   Patient's recorded medical, surgical, social, medication list and allergies were reviewed in the Snapshot window as part of the initial history.   Review of Systems   Review of Systems  Constitutional:  Negative for chills and fever.  HENT:  Negative for ear pain and sore throat.   Eyes:  Negative for pain and visual disturbance.  Respiratory:  Negative for cough and shortness of breath.   Cardiovascular:  Negative for chest pain and palpitations.  Gastrointestinal:  Negative for abdominal pain and vomiting.  Genitourinary:  Negative for dysuria and hematuria.  Musculoskeletal:  Negative for arthralgias and back pain.  Skin:  Positive for wound. Negative for color change and rash.  Neurological:  Negative for seizures and syncope.  All other systems reviewed and are negative.   Physical Exam Updated Vital Signs BP (!) 103/55   Pulse 89   Temp 98.6 F (37 C) (Oral)   Resp 15   Ht 5\' 4"  (1.626 m)   Wt 75.8 kg   SpO2 98%   BMI 28.67 kg/m  Physical Exam Vitals and nursing note reviewed.  Constitutional:      General: She is not in acute distress.    Appearance: She is well-developed.  HENT:     Head: Normocephalic and atraumatic.  Eyes:     Conjunctiva/sclera: Conjunctivae normal.  Cardiovascular:     Rate and Rhythm: Normal rate and  regular rhythm.     Heart sounds: No murmur heard. Pulmonary:     Effort: Pulmonary effort is normal. No respiratory distress.     Breath sounds: Normal breath sounds.  Abdominal:     General: There is no distension.     Palpations: Abdomen is soft.     Tenderness: There is no abdominal tenderness. There is no right CVA tenderness or left CVA tenderness.  Musculoskeletal:        General: Deformity present. No swelling or tenderness. Normal range of motion.     Cervical back: Neck supple.  Skin:    General: Skin is warm and dry.  Neurological:     General: No focal deficit present.     Mental Status: She is alert and oriented to person, place, and time. Mental status is at baseline.     Cranial Nerves: No cranial nerve deficit.         ED Course/ Medical Decision Making/ A&P Clinical Course as of 06/15/22 2302  Sat Jun 15, 2022  2025 Attempted to call [facility] plastic surgery.  They stated they do not take overnight consults. [CC]    Clinical Course User Index [CC] Tretha Sciara, MD    Procedures Procedures   Medications Ordered in ED Medications  iohexol (OMNIPAQUE) 300 MG/ML solution 85 mL (85 mLs Intravenous Contrast Given 06/15/22 2029)  cefTRIAXone (ROCEPHIN) 1 g in sodium chloride 0.9 % 100 mL IVPB (  0 g Intravenous Stopped 06/15/22 2253)  HYDROcodone-acetaminophen (NORCO/VICODIN) 5-325 MG per tablet 1 tablet (1 tablet Oral Given 06/15/22 2216)    Medical Decision Making:    Kelsey [name] is a 76 y.o. female who presented to the ED today with drainage around her surgical site detailed above.     Complete initial physical exam performed, notably the patient  was HDS in NAD.      Reviewed and confirmed nursing documentation for past medical history, family history, social history.    Initial Assessment:   Patient's presentation is more consistent with poor wound care.  Her bandage was completely soaked leading to worsening soft tissue injury.  I have  also considered surgical site infection but this seems grossly less likely based on lack of fever, lack of pain, overall well appearance.  This is most consistent with an acute life/limb threatening illness complicated by underlying chronic conditions.  Initial Plan:  CT abdomen pelvis with contrast to evaluate for more complex underlying skin infection Screening labs including CBC and Metabolic panel to evaluate for infectious or metabolic etiology of disease.  Objective evaluation as below reviewed with plan for close reassessment  Initial Study Results:   Laboratory  All laboratory results reviewed without evidence of clinically relevant pathology.   Hemoglobin at baseline of 7-8 from her chronic anemia Patient states that this is her baseline hemoglobin  Radiology  All images reviewed independently. Agree with radiology report at this time.   CT ABDOMEN PELVIS W CONTRAST  Result Date: 06/15/2022 CLINICAL DATA:  Abdominal pain, post-op . Pt states had Panniculectomy on 12/18, had post appointment on 12/27 States now having bleeding from site that is worsening. EXAM: CT ABDOMEN AND PELVIS WITH CONTRAST TECHNIQUE: Multidetector CT imaging of the abdomen and pelvis was performed using the standard protocol following bolus administration of intravenous contrast. RADIATION DOSE REDUCTION: This exam was performed according to the departmental dose-optimization program which includes automated exposure control, adjustment of the mA and/or kV according to patient size and/or use of iterative reconstruction technique. CONTRAST:  35mL OMNIPAQUE IOHEXOL 300 MG/ML  SOLN COMPARISON:  CT abdomen pelvis 12/20/2013 FINDINGS: Lower chest: Cardiac leads partially visualized. No acute abnormality. Hepatobiliary: No focal liver abnormality. Contracted gallbladder. No gallstones, gallbladder wall thickening, or pericholecystic fluid. No biliary dilatation. Pancreas: No focal lesion. Normal pancreatic contour. No  surrounding inflammatory changes. No main pancreatic ductal dilatation. Spleen: Normal in size without focal abnormality. Adrenals/Urinary Tract: No adrenal nodule bilaterally. Bilateral kidneys enhance symmetrically. 1.9 cm fluid dense lesion within left kidney likely represents a simple renal cyst. Simple renal cysts, in the absence of clinically indicated signs/symptoms, require no independent follow-up. Subcentimeter hypodensities are too small to characterize-no further follow-up indicated. No hydronephrosis. No hydroureter. The urinary bladder is unremarkable. On delayed imaging, there is no urothelial wall thickening and there are no filling defects in the opacified portions of the bilateral collecting systems or ureters. Stomach/Bowel: Stomach is within normal limits. No evidence of bowel wall thickening or dilatation. Appendix appears normal. Vascular/Lymphatic: No abdominal aorta or iliac aneurysm. Mild atherosclerotic plaque of the aorta and its branches. No abdominal, pelvic, or inguinal lymphadenopathy. Reproductive: Status post hysterectomy. No adnexal masses. Other: No intraperitoneal free fluid. No intraperitoneal free gas. No organized fluid collection. Musculoskeletal: Extensive subcutaneus soft tissue edema and emphysema along the anterior abdominal soft tissues with surgical drain noted within the region. No organized fluid collection. Surgical clips along the soft tissues. No suspicious lytic or blastic osseous lesions. No  acute displaced fracture. Chronic slightly worsened L2 compression fracture. IMPRESSION: 1. Extensive subcutaneus soft tissue edema and emphysema along the anterior abdominal soft tissues with surgical drain. No organized fluid collection. Gas could be from the surgical drain and postsurgical changes; however, correlate clinically for underlying infection. 2. Colonic diverticulosis with no acute diverticulitis. 3.  Aortic Atherosclerosis (ICD10-I70.0). Electronically Signed    By: Iven Finn M.D.   On: 06/15/2022 21:01     Final Assessment and Plan:   Ultimately, patient's extensive objective evaluation resulted with likely cellulitis of her surgical site.  Wound is slowly continuing to drain and have worsening leakage.  It does not appear to fully dehisced. Attempted to consult plastic surgery team that performed surgery at main campus.  I was told by their call center that they do not have anybody on-call and that plastic surgery cases after hours are referred to [facility].  I told the patient about this.  She stated that she would not want to be transferred to [facility] as she lives in this area and gets all of her care over here.  I told her that we could consult with the plastic surgeons for or recommendations and admit the patient locally but the patient declined stating that she would only want outpatient care at this time and would call her surgeon during business hours.  Overall this is reasonable as this has been a progressive problem in the outpatient setting already followed by surgery.  Strict wound care reinforced with 3 times daily dressing changes as well as antibiotics initiated with ceftriaxone IV as well as p.o. Augmentin until able to be seen by her surgical team.  Informed patient that if she begins have any worsening dehiscence she needs to return for further care and management.   Disposition:  I have considered need for hospitalization, however, considering all of the above, I believe this patient is stable for discharge at this time.  Patient/family educated about specific return precautions for given chief complaint and symptoms.  Patient/family educated about follow-up with PCP.     Patient/family expressed understanding of return precautions and need for follow-up. Patient spoken to regarding all imaging and laboratory results and appropriate follow up for these results. All education provided in verbal form with additional information  in written form. Time was allowed for answering of patient questions. Patient discharged.    Emergency Department Medication Summary:   Medications  iohexol (OMNIPAQUE) 300 MG/ML solution 85 mL (85 mLs Intravenous Contrast Given 06/15/22 2029)  cefTRIAXone (ROCEPHIN) 1 g in sodium chloride 0.9 % 100 mL IVPB (0 g Intravenous Stopped 06/15/22 2253)  HYDROcodone-acetaminophen (NORCO/VICODIN) 5-325 MG per tablet 1 tablet (1 tablet Oral Given 06/15/22 2216)         Clinical Impression:  1. Generalized abdominal pain   2. Cellulitis, unspecified cellulitis site      Discharge   Final Clinical Impression(s) / ED Diagnoses Final diagnoses:  Generalized abdominal pain  Cellulitis, unspecified cellulitis site    Rx / DC Orders ED Discharge Orders          Ordered    amoxicillin-clavulanate (AUGMENTIN) 875-125 MG tablet  Every 12 hours        06/15/22 2256              Tretha Sciara, MD 06/15/22 2303

## 2022-06-15 NOTE — ED Notes (Signed)
Pt provided ginger ale, per her request.

## 2022-06-15 NOTE — ED Triage Notes (Signed)
Pt states had Panniculectomy on 12/18, had post appointment on 12/27 States now having bleeding from site that is worsening  Has Drain in place but states feels like stitches are split open   Denies fever, redness, or purulent drainage Next appointment on 1/3

## 2022-06-15 NOTE — ED Notes (Signed)
Pts incision dressed with non adherent dressing, abdominal pad, medipore tape.

## 2022-06-19 ENCOUNTER — Ambulatory Visit (HOSPITAL_COMMUNITY): Payer: Medicare PPO | Admitting: Certified Registered Nurse Anesthetist

## 2022-06-19 ENCOUNTER — Other Ambulatory Visit: Payer: Self-pay

## 2022-06-19 ENCOUNTER — Encounter (HOSPITAL_COMMUNITY): Payer: Self-pay | Admitting: Plastic Surgery

## 2022-06-19 ENCOUNTER — Encounter (HOSPITAL_COMMUNITY)
Admission: RE | Disposition: A | Discharge status: Home / Self Care | Payer: Self-pay | Source: Home / Self Care | Attending: Plastic Surgery

## 2022-06-19 ENCOUNTER — Ambulatory Visit (HOSPITAL_BASED_OUTPATIENT_CLINIC_OR_DEPARTMENT_OTHER): Payer: Medicare PPO | Admitting: Certified Registered Nurse Anesthetist

## 2022-06-19 ENCOUNTER — Ambulatory Visit (HOSPITAL_COMMUNITY)
Admission: RE | Admit: 2022-06-19 | Discharge: 2022-06-19 | Disposition: A | Discharge status: Home / Self Care | Payer: Medicare PPO | Attending: Plastic Surgery | Admitting: Plastic Surgery

## 2022-06-19 DIAGNOSIS — T8132XA Disruption of internal operation (surgical) wound, not elsewhere classified, initial encounter: Secondary | ICD-10-CM

## 2022-06-19 DIAGNOSIS — Z950 Presence of cardiac pacemaker: Secondary | ICD-10-CM | POA: Diagnosis not present

## 2022-06-19 DIAGNOSIS — I10 Essential (primary) hypertension: Secondary | ICD-10-CM | POA: Insufficient documentation

## 2022-06-19 DIAGNOSIS — T84033A Mechanical loosening of internal left knee prosthetic joint, initial encounter: Secondary | ICD-10-CM | POA: Insufficient documentation

## 2022-06-19 DIAGNOSIS — Z87891 Personal history of nicotine dependence: Secondary | ICD-10-CM | POA: Diagnosis not present

## 2022-06-19 DIAGNOSIS — I350 Nonrheumatic aortic (valve) stenosis: Secondary | ICD-10-CM | POA: Diagnosis not present

## 2022-06-19 DIAGNOSIS — Y838 Other surgical procedures as the cause of abnormal reaction of the patient, or of later complication, without mention of misadventure at the time of the procedure: Secondary | ICD-10-CM | POA: Insufficient documentation

## 2022-06-19 DIAGNOSIS — I442 Atrioventricular block, complete: Secondary | ICD-10-CM | POA: Diagnosis not present

## 2022-06-19 DIAGNOSIS — K219 Gastro-esophageal reflux disease without esophagitis: Secondary | ICD-10-CM | POA: Insufficient documentation

## 2022-06-19 DIAGNOSIS — T8131XA Disruption of external operation (surgical) wound, not elsewhere classified, initial encounter: Secondary | ICD-10-CM | POA: Diagnosis not present

## 2022-06-19 DIAGNOSIS — Z79899 Other long term (current) drug therapy: Secondary | ICD-10-CM | POA: Insufficient documentation

## 2022-06-19 DIAGNOSIS — Z8673 Personal history of transient ischemic attack (TIA), and cerebral infarction without residual deficits: Secondary | ICD-10-CM | POA: Diagnosis not present

## 2022-06-19 DIAGNOSIS — Z79891 Long term (current) use of opiate analgesic: Secondary | ICD-10-CM | POA: Insufficient documentation

## 2022-06-19 HISTORY — PX: DEBRIDEMENT AND CLOSURE WOUND: SHX5614

## 2022-06-19 LAB — TYPE AND SCREEN
ABO/RH(D): O POS
Antibody Screen: NEGATIVE

## 2022-06-19 LAB — CBC
HCT: 25.2 % — ABNORMAL LOW (ref 36.0–46.0)
Hemoglobin: 7.7 g/dL — ABNORMAL LOW (ref 12.0–15.0)
MCH: 28.5 pg (ref 26.0–34.0)
MCHC: 30.6 g/dL (ref 30.0–36.0)
MCV: 93.3 fL (ref 80.0–100.0)
Platelets: 584 10*3/uL — ABNORMAL HIGH (ref 150–400)
RBC: 2.7 MIL/uL — ABNORMAL LOW (ref 3.87–5.11)
RDW: 16.2 % — ABNORMAL HIGH (ref 11.5–15.5)
WBC: 6.6 10*3/uL (ref 4.0–10.5)
nRBC: 0 % (ref 0.0–0.2)

## 2022-06-19 SURGERY — DEBRIDEMENT, WOUND, WITH CLOSURE
Anesthesia: General | Site: Abdomen

## 2022-06-19 MED ORDER — FENTANYL CITRATE (PF) 250 MCG/5ML IJ SOLN
INTRAMUSCULAR | Status: AC
  Filled 2022-06-19: qty 5

## 2022-06-19 MED ORDER — ONDANSETRON HCL 4 MG/2ML IJ SOLN
INTRAMUSCULAR | Status: AC
  Filled 2022-06-19: qty 2

## 2022-06-19 MED ORDER — ORAL CARE MOUTH RINSE: 15.0000 mL | Freq: Once | OROMUCOSAL | Status: AC

## 2022-06-19 MED ORDER — DEXAMETHASONE SODIUM PHOSPHATE 10 MG/ML IJ SOLN
INTRAMUSCULAR | Status: DC | PRN
  Administered 2022-06-19: 5 mg via INTRAVENOUS

## 2022-06-19 MED ORDER — DEXMEDETOMIDINE HCL IN NACL 80 MCG/20ML IV SOLN
INTRAVENOUS | Status: DC | PRN
  Administered 2022-06-19: 8 ug via BUCCAL

## 2022-06-19 MED ORDER — PROPOFOL 10 MG/ML IV BOLUS
INTRAVENOUS | Status: AC
  Filled 2022-06-19: qty 20

## 2022-06-19 MED ORDER — HYDROCODONE-ACETAMINOPHEN 10-325 MG PO TABS: 1.0000 | ORAL_TABLET | Freq: Two times a day (BID) | ORAL | 0 refills | Status: AC | PRN

## 2022-06-19 MED ORDER — FENTANYL CITRATE (PF) 100 MCG/2ML IJ SOLN
25.0000 ug | INTRAMUSCULAR | Status: DC | PRN
  Administered 2022-06-19 (×2): 25 ug via INTRAVENOUS

## 2022-06-19 MED ORDER — LIDOCAINE 2% (20 MG/ML) 5 ML SYRINGE
INTRAMUSCULAR | Status: AC
  Filled 2022-06-19: qty 5

## 2022-06-19 MED ORDER — PHENYLEPHRINE 80 MCG/ML (10ML) SYRINGE FOR IV PUSH (FOR BLOOD PRESSURE SUPPORT)
PREFILLED_SYRINGE | INTRAVENOUS | Status: AC
  Filled 2022-06-19: qty 10

## 2022-06-19 MED ORDER — FENTANYL CITRATE (PF) 100 MCG/2ML IJ SOLN
INTRAMUSCULAR | Status: AC
  Filled 2022-06-19: qty 2

## 2022-06-19 MED ORDER — METOPROLOL SUCCINATE ER 25 MG PO TB24
25.0000 mg | ORAL_TABLET | Freq: Once | ORAL | Status: AC
  Administered 2022-06-19: 25 mg via ORAL
  Filled 2022-06-19 (×2): qty 1

## 2022-06-19 MED ORDER — PHENYLEPHRINE 80 MCG/ML (10ML) SYRINGE FOR IV PUSH (FOR BLOOD PRESSURE SUPPORT)
PREFILLED_SYRINGE | INTRAVENOUS | Status: DC | PRN
  Administered 2022-06-19: 160 ug via INTRAVENOUS

## 2022-06-19 MED ORDER — CEFAZOLIN SODIUM-DEXTROSE 2-4 GM/100ML-% IV SOLN
2.0000 g | INTRAVENOUS | Status: AC
  Administered 2022-06-19: 2 g via INTRAVENOUS
  Filled 2022-06-19: qty 100

## 2022-06-19 MED ORDER — PROPOFOL 10 MG/ML IV BOLUS
INTRAVENOUS | Status: DC | PRN
  Administered 2022-06-19: 130 mg via INTRAVENOUS

## 2022-06-19 MED ORDER — SUGAMMADEX SODIUM 200 MG/2ML IV SOLN
INTRAVENOUS | Status: DC | PRN
  Administered 2022-06-19: 200 mg via INTRAVENOUS

## 2022-06-19 MED ORDER — DEXAMETHASONE SODIUM PHOSPHATE 10 MG/ML IJ SOLN
INTRAMUSCULAR | Status: AC
  Filled 2022-06-19: qty 1

## 2022-06-19 MED ORDER — ROCURONIUM BROMIDE 10 MG/ML (PF) SYRINGE
PREFILLED_SYRINGE | INTRAVENOUS | Status: AC
  Filled 2022-06-19: qty 10

## 2022-06-19 MED ORDER — CHLORHEXIDINE GLUCONATE 0.12 % MT SOLN
15.0000 mL | Freq: Once | OROMUCOSAL | Status: AC
  Administered 2022-06-19: 15 mL via OROMUCOSAL
  Filled 2022-06-19: qty 15

## 2022-06-19 MED ORDER — ROCURONIUM BROMIDE 10 MG/ML (PF) SYRINGE
PREFILLED_SYRINGE | INTRAVENOUS | Status: DC | PRN
  Administered 2022-06-19: 20 mg via INTRAVENOUS
  Administered 2022-06-19: 40 mg via INTRAVENOUS
  Administered 2022-06-19: 10 mg via INTRAVENOUS

## 2022-06-19 MED ORDER — ONDANSETRON HCL 4 MG/2ML IJ SOLN
INTRAMUSCULAR | Status: DC | PRN
  Administered 2022-06-19: 4 mg via INTRAVENOUS

## 2022-06-19 MED ORDER — LACTATED RINGERS IV SOLN: INTRAVENOUS | Status: DC

## 2022-06-19 MED ORDER — LIDOCAINE 2% (20 MG/ML) 5 ML SYRINGE
INTRAMUSCULAR | Status: DC | PRN
  Administered 2022-06-19: 40 mg via INTRAVENOUS

## 2022-06-19 MED ORDER — FENTANYL CITRATE (PF) 250 MCG/5ML IJ SOLN
INTRAMUSCULAR | Status: DC | PRN
  Administered 2022-06-19 (×5): 50 ug via INTRAVENOUS

## 2022-06-19 MED ORDER — 0.9 % SODIUM CHLORIDE (POUR BTL) OPTIME
TOPICAL | Status: DC | PRN
  Administered 2022-06-19: 1000 mL

## 2022-06-19 SURGICAL SUPPLY — 57 items
ADH SKN CLS APL DERMABOND .7 (GAUZE/BANDAGES/DRESSINGS) ×2
APL PRP STRL LF DISP 70% ISPRP (MISCELLANEOUS) ×1
APPLIER CLIP 9.375 MED OPEN (MISCELLANEOUS)
APR CLP MED 9.3 20 MLT OPN (MISCELLANEOUS)
BAG COUNTER SPONGE SURGICOUNT (BAG) IMPLANT
BAG SPNG CNTER NS LX DISP (BAG) ×1
BINDER ABDOMINAL 10 UNV 27-48 (MISCELLANEOUS) IMPLANT
BINDER ABDOMINAL 12 ML 46-62 (SOFTGOODS) ×1 IMPLANT
BINDER ABDOMINAL 12 XL 75-84 (SOFTGOODS) ×1 IMPLANT
BLADE CLIPPER SURG (BLADE) IMPLANT
BLADE SURG 10 STRL SS (BLADE) ×2 IMPLANT
BLADE SURG 11 STRL SS (BLADE) ×1 IMPLANT
BLADE SURG 15 STRL LF DISP TIS (BLADE) IMPLANT
BLADE SURG 15 STRL SS (BLADE)
CHLORAPREP W/TINT 26 (MISCELLANEOUS) ×1 IMPLANT
CLIP APPLIE 9.375 MED OPEN (MISCELLANEOUS) ×1 IMPLANT
DERMABOND ADVANCED .7 DNX12 (GAUZE/BANDAGES/DRESSINGS) ×2 IMPLANT
DRAIN CHANNEL 15F RND FF W/TCR (WOUND CARE) ×2 IMPLANT
DRAPE HALF SHEET 40X57 (DRAPES) ×2 IMPLANT
DRAPE TOP ARMCOVERS (MISCELLANEOUS) ×1 IMPLANT
DRAPE U-SHAPE 76X120 STRL (DRAPES) ×1 IMPLANT
ELECT BLADE 4.0 EZ CLEAN MEGAD (MISCELLANEOUS) ×1
ELECT COATED BLADE 2.86 ST (ELECTRODE) ×1 IMPLANT
ELECT REM PT RETURN 9FT ADLT (ELECTROSURGICAL) ×1
ELECTRODE BLDE 4.0 EZ CLN MEGD (MISCELLANEOUS) IMPLANT
ELECTRODE REM PT RTRN 9FT ADLT (ELECTROSURGICAL) ×1 IMPLANT
EVACUATOR SILICONE 100CC (DRAIN) ×2 IMPLANT
GAUZE PAD ABD 8X10 STRL (GAUZE/BANDAGES/DRESSINGS) ×2 IMPLANT
GAUZE SPONGE 4X4 12PLY STRL (GAUZE/BANDAGES/DRESSINGS) IMPLANT
GAUZE XEROFORM 1X8 LF (GAUZE/BANDAGES/DRESSINGS) IMPLANT
GLOVE BIO SURGEON STRL SZ 6 (GLOVE) ×3 IMPLANT
GOWN STRL REUS W/ TWL LRG LVL3 (GOWN DISPOSABLE) ×3 IMPLANT
GOWN STRL REUS W/TWL LRG LVL3 (GOWN DISPOSABLE) ×3
KIT BASIN OR (CUSTOM PROCEDURE TRAY) ×1 IMPLANT
KIT TURNOVER KIT B (KITS) ×1 IMPLANT
MARKER SKIN DUAL TIP RULER LAB (MISCELLANEOUS) ×1 IMPLANT
NDL HYPO 25X1 1.5 SAFETY (NEEDLE) IMPLANT
NEEDLE HYPO 25X1 1.5 SAFETY (NEEDLE) IMPLANT
NS IRRIG 1000ML POUR BTL (IV SOLUTION) ×1 IMPLANT
PACK GENERAL/GYN (CUSTOM PROCEDURE TRAY) ×1 IMPLANT
PENCIL SMOKE EVACUATOR (MISCELLANEOUS) ×1 IMPLANT
PIN SAFETY STERILE (MISCELLANEOUS) ×1 IMPLANT
SPONGE T-LAP 18X18 ~~LOC~~+RFID (SPONGE) ×2 IMPLANT
SUT DVC VLOC 180 0 12IN GS21 (SUTURE)
SUT ETHILON 2 0 FS 18 (SUTURE) ×2 IMPLANT
SUT MNCRL AB 4-0 PS2 18 (SUTURE) ×2 IMPLANT
SUT PDS AB 0 CT1 27 (SUTURE) ×4 IMPLANT
SUT PLAIN 5 0 P 3 18 (SUTURE) IMPLANT
SUT VLOC 180 0 24IN GS25 (SUTURE) IMPLANT
SUTURE DVC VLC 180 0 12IN GS21 (SUTURE) IMPLANT
TOWEL GREEN STERILE FF (TOWEL DISPOSABLE) ×1 IMPLANT
TRAY FOL W/BAG SLVR 16FR STRL (SET/KITS/TRAYS/PACK) IMPLANT
TRAY FOLEY W/BAG SLVR 16FR (SET/KITS/TRAYS/PACK)
TRAY FOLEY W/BAG SLVR 16FR LF (SET/KITS/TRAYS/PACK)
TRAY FOLEY W/BAG SLVR 16FR ST (SET/KITS/TRAYS/PACK) IMPLANT
UNDERPAD 30X36 HEAVY ABSORB (UNDERPADS AND DIAPERS) ×2 IMPLANT
WATER STERILE IRR 1000ML POUR (IV SOLUTION) ×1 IMPLANT

## 2022-06-19 NOTE — Op Note (Signed)
Operative Note   DATE OF OPERATION: 1.3.24  LOCATION: [facility] OR-outpatient  SURGICAL DIVISION: Plastic Surgery  PREOPERATIVE DIAGNOSES:  1. S/p panniculectomy 2. Dehiscence surgical incision  POSTOPERATIVE DIAGNOSES:  same  PROCEDURE:  Closure abdominal wound 50 cm  SURGEON: Irene Limbo MD MBA  ASSISTANT: Gentry Fitz RNFA  ANESTHESIA:  General.   EBL: 098 ml  COMPLICATIONS: None immediate.   INDICATIONS FOR PROCEDURE:  The patient, Kelsey [name], is a 77 y.o. female born on [birth year], is here for treatment dehiscence surgical incision 2.5 weeks following panniculectomy.   FINDINGS: No evidence infection. Progressive separation surgical incision noted over last week.   DESCRIPTION OF PROCEDURE:  The patient's operative site was marked with the patient in the preoperative area. The patient was taken to the operating room. SCDs were placed and IV antibiotics were given. The patient's operative site was prepped and draped in a sterile fashion. A time out was performed and all information was confirmed to be correct. Sharp excision skin margins wound completed. Curettage completed over wound edges. Wound irrigated. Closure completed with interrupted 2-0 PDS suture in superficial fascia and dermis. Skin closure completed with 2-0 nylon interrupted and short running. Length closure 50 cm. Dry dressing and abdominal binder applied.   The patient was allowed to wake from anesthesia, extubated and taken to the recovery room in satisfactory condition.   SPECIMENS: none  DRAINS: 70 Fr JP remains in place  Irene Limbo, MD [facility] Plastic & Reconstructive Surgery  Office/ physician access line after hours [phone]

## 2022-06-19 NOTE — Transfer of Care (Signed)
Immediate Anesthesia Transfer of Care Note  Patient: Kelsey [name]  Procedure(s) Performed: CLOSURE OF ABDOMINAL WOUND (Abdomen)  Patient Location: PACU  Anesthesia Type:General  Level of Consciousness: drowsy, patient cooperative, and responds to stimulation  Airway & Oxygen Therapy: Patient Spontanous Breathing  Post-op Assessment: Report given to RN and Post -op Vital signs reviewed and stable  Post vital signs: Reviewed and stable  Last Vitals:  Vitals Value Taken Time  BP 128/71 06/19/22 1607  Temp    Pulse 71 06/19/22 1612  Resp 14 06/19/22 1612  SpO2 95 % 06/19/22 1612  Vitals shown include unvalidated device data.  Last Pain:  Vitals:   06/19/22 1314  TempSrc:   PainSc: 5       Patients Stated Pain Goal: 0 (19/50/93 2671)  Complications: No notable events documented.

## 2022-06-19 NOTE — Progress Notes (Signed)
Patient has PPM.  Dr. Gloris Manchester aware.  No need for [employer] rep to be present.  Will use magnet if needed per Dr. Gloris Manchester.  Lanelle Bal, RN also aware of plan.

## 2022-06-19 NOTE — Progress Notes (Addendum)
Dr. Gloris Manchester made aware of patient's hemoglobin result- 7.1 on CBC from 06/16/22. Repeat CBC today per Dr. Gloris Manchester.  59- Dr. Gloris Manchester made aware of today's CBC result.

## 2022-06-19 NOTE — Anesthesia Preprocedure Evaluation (Addendum)
Anesthesia Evaluation  Patient identified by MRN, date of birth, ID band Patient awake    Reviewed: Allergy & Precautions, NPO status , Patient's Chart, lab work & pertinent test results  Airway Mallampati: II  TM Distance: >3 FB Neck ROM: Full    Dental  (+) Upper Dentures, Partial Lower   Pulmonary former smoker   Pulmonary exam normal        Cardiovascular hypertension, Pt. on medications + dysrhythmias + pacemaker + Valvular Problems/Murmurs AS  Rhythm:Regular Rate:Normal  ECHO 2023:  1. Left ventricular ejection fraction, by estimation, is 60 to 65%. The  left ventricle has normal function. The left ventricle has no regional  wall motion abnormalities. There is moderate asymmetric left ventricular  hypertrophy of the basal-septal  segment. Left ventricular diastolic parameters are consistent with Grade I  diastolic dysfunction (impaired relaxation). The average left ventricular  global longitudinal strain is -19.2 %. The global longitudinal strain is  normal.   2. Right ventricular systolic function is normal. The right ventricular  size is normal. There is mildly elevated pulmonary artery systolic  pressure. The estimated right ventricular systolic pressure is 75.6 mmHg.   3. Left atrial size was mildly dilated.   4. The mitral valve is grossly normal. Mild mitral valve regurgitation.   5. Tricuspid valve regurgitation is moderate.   6. The aortic valve is tricuspid. There is moderate calcification of the  aortic valve. There is moderate thickening of the aortic valve. Aortic  valve regurgitation is mild to moderate. Moderate aortic valve stenosis.  Aortic valve area, by VTI measures  1.11 cm. Aortic valve mean gradient measures 23.0 mmHg. Aortic valve Vmax  measures 3.05 m/s.   7. The inferior vena cava is normal in size with greater than 50%  respiratory variability, suggesting right atrial pressure of 3 mmHg.      Neuro/Psych TIACVA  negative psych ROS   GI/Hepatic Neg liver ROS,GERD  Medicated,,  Endo/Other  negative endocrine ROS    Renal/GU negative Renal ROS  negative genitourinary   Musculoskeletal  (+) Arthritis , Osteoarthritis,    Abdominal Normal abdominal exam  (+)   Peds  Hematology negative hematology ROS (+)   Anesthesia Other Findings   Reproductive/Obstetrics                             Anesthesia Physical Anesthesia Plan  ASA: 3  Anesthesia Plan: General   Post-op Pain Management:    Induction: Intravenous  PONV Risk Score and Plan: 3 and Ondansetron, Dexamethasone and Treatment may vary due to age or medical condition  Airway Management Planned: Mask and Oral ETT  Additional Equipment: None  Intra-op Plan:   Post-operative Plan: Extubation in OR  Informed Consent: I have reviewed the patients History and Physical, chart, labs and discussed the procedure including the risks, benefits and alternatives for the proposed anesthesia with the patient or authorized representative who has indicated his/her understanding and acceptance.     Dental advisory given  Plan Discussed with: CRNA  Anesthesia Plan Comments: (Lab Results      Component                Value               Date                      WBC  6.6                 06/19/2022                HGB                      7.7 (L)             06/19/2022                HCT                      25.2 (L)            06/19/2022                MCV                      93.3                06/19/2022                PLT                      584 (H)             06/19/2022             Lab Results      Component                Value               Date                      NA                       137                 06/15/2022                K                        3.7                 06/15/2022                CO2                      27                  06/15/2022                 GLUCOSE                  114 (H)             06/15/2022                BUN                      20                  06/15/2022                CREATININE               1.23 (H)  06/15/2022                CALCIUM                  9.2                 06/15/2022                GFRNONAA                 46 (L)              06/15/2022           )       Anesthesia Quick Evaluation

## 2022-06-19 NOTE — H&P (Signed)
Subjective:     Patient ID: Kelsey [name] is a 77 y.o. female.   Follow-up   16 d post op. Noted at week 1 post op to have partial dehiscence central incision. Dry dressing as needed recommended. Since last visit had ED visit to [place]. CT scan completed there without evidence abscess. Given antibiotic course. Patient went to [facility] ED following day and exam noted to be without infection. Labs completed on both visits with stable Hb 7.1 and she continues on iron supplementation. Drain- no log. Reports for last 4 days at least bulb will not stay deflated.   Soft tissue resection 3086 g    Lives with son. Retired [employer].    PMH significant pacemaker placement for complete heart block. History UGI bleed 2021. ECHO 12/2021 EF 60-65%, moderate AS/AI.    She also has known loosening left TKA and being managed expectantly.   On pain contract with Kelsey [name], chronic Norco use.    Reports quit smoking 8.29.23.   Review of Systems      Objective:   Physical Exam  Constitutional: She is oriented to person, place, and time.  Cardiovascular: Normal rate and regular rhythm.  Murmur heard. Pulmonary/Chest: Effort normal and breath sounds normal.  Neurological: She is oriented to person, place, and time.    Abd:  soft no cellulitis Central incision appr 0.5 x 15 x 2 cm has dehisced- patient did not note progression of this and states has not looked at wound. Base 90 % granulated Drain scant serosanguinous, holds suction then eventually bulb inflate.   Umbilicus viable Assessment:     Panniculitis s/p panniculectomy    Plan:    No evidence infection. Reviewed options local wound care vs closure wound. Counseled with latter no guarantee that she will not have recurrent wound healing problems. Goal of closure would be to make wound care more manageable for her as she has significant drainage and self reports that she does not look at wound. Patient has  elected for closure. Plan OR today. Counseled drain likely behaving this way as communicating with open wound and will likely leave this in place and that she need to complete log.

## 2022-06-19 NOTE — Anesthesia Procedure Notes (Signed)
Procedure Name: Intubation Date/Time: 06/19/2022 2:32 PM  Performed by: Colin Benton, CRNAPre-anesthesia Checklist: Patient identified, Emergency Drugs available, Suction available and Patient being monitored Patient Re-evaluated:Patient Re-evaluated prior to induction Oxygen Delivery Method: Circle system utilized Preoxygenation: Pre-oxygenation with 100% oxygen Induction Type: IV induction Ventilation: Mask ventilation without difficulty and Oral airway inserted - appropriate to patient size Laryngoscope Size: Miller and 3 Grade View: Grade I Tube type: Oral Tube size: 7.0 mm Number of attempts: 1 Airway Equipment and Method: Stylet and Oral airway Placement Confirmation: ETT inserted through vocal cords under direct vision, positive ETCO2 and breath sounds checked- equal and bilateral Secured at: 20 cm Tube secured with: Tape Dental Injury: Teeth and Oropharynx as per pre-operative assessment

## 2022-06-20 ENCOUNTER — Encounter (HOSPITAL_COMMUNITY): Payer: Self-pay | Admitting: Plastic Surgery

## 2022-06-20 NOTE — Anesthesia Postprocedure Evaluation (Signed)
Anesthesia Post Note  Patient: Kelsey [name]  Procedure(s) Performed: CLOSURE OF ABDOMINAL WOUND (Abdomen)     Patient location during evaluation: PACU Anesthesia Type: General Level of consciousness: awake and alert Pain management: pain level controlled Vital Signs Assessment: post-procedure vital signs reviewed and stable Respiratory status: spontaneous breathing, nonlabored ventilation, respiratory function stable and patient connected to nasal cannula oxygen Cardiovascular status: blood pressure returned to baseline and stable Postop Assessment: no apparent nausea or vomiting Anesthetic complications: no   No notable events documented.  Last Vitals:  Vitals:   06/19/22 1630 06/19/22 1643  BP: (!) 111/56 (!) 112/57  Pulse: 60 63  Resp: 15 11  Temp:  36.7 C  SpO2: 100% 100%    Last Pain:  Vitals:   06/19/22 1607  TempSrc:   PainSc: 10-Worst pain ever                 Belenda Cruise P Taffie Eckmann

## 2022-07-09 ENCOUNTER — Ambulatory Visit: Payer: Medicare Other | Admitting: Internal Medicine

## 2022-07-11 ENCOUNTER — Ambulatory Visit: Payer: Medicare PPO | Attending: Internal Medicine

## 2022-07-11 DIAGNOSIS — I495 Sick sinus syndrome: Secondary | ICD-10-CM | POA: Diagnosis not present

## 2022-07-11 LAB — CUP PACEART REMOTE DEVICE CHECK
Battery Remaining Longevity: 137 mo
Battery Voltage: 3.01 V
Brady Statistic AP VP Percent: 0.01 %
Brady Statistic AP VS Percent: 1.49 %
Brady Statistic AS VP Percent: 0.03 %
Brady Statistic AS VS Percent: 98.47 %
Brady Statistic RA Percent Paced: 1.49 %
Brady Statistic RV Percent Paced: 0.04 %
Date Time Interrogation Session: 20240124233011
Implantable Lead Connection Status: 753985
Implantable Lead Connection Status: 753985
Implantable Lead Implant Date: 20200129
Implantable Lead Implant Date: 20200129
Implantable Lead Location: 753859
Implantable Lead Location: 753860
Implantable Lead Model: 3830
Implantable Lead Model: 5076
Implantable Pulse Generator Implant Date: 20200129
Lead Channel Impedance Value: 285 Ohm
Lead Channel Impedance Value: 304 Ohm
Lead Channel Impedance Value: 342 Ohm
Lead Channel Impedance Value: 475 Ohm
Lead Channel Pacing Threshold Amplitude: 0.5 V
Lead Channel Pacing Threshold Amplitude: 0.875 V
Lead Channel Pacing Threshold Pulse Width: 0.4 ms
Lead Channel Pacing Threshold Pulse Width: 0.4 ms
Lead Channel Sensing Intrinsic Amplitude: 1.75 mV
Lead Channel Sensing Intrinsic Amplitude: 1.75 mV
Lead Channel Sensing Intrinsic Amplitude: 19.75 mV
Lead Channel Sensing Intrinsic Amplitude: 19.75 mV
Lead Channel Setting Pacing Amplitude: 1.5 V
Lead Channel Setting Pacing Amplitude: 2 V
Lead Channel Setting Pacing Pulse Width: 0.4 ms
Lead Channel Setting Sensing Sensitivity: 1.2 mV
Zone Setting Status: 755011
Zone Setting Status: 755011

## 2022-07-16 NOTE — Progress Notes (Addendum)
Cardiology Office Note   Date:  07/20/2022   ID:  Kelsey, [name] [birth year], MRN [id number]  PCP:  Glendon Axe, MD  Cardiologist:   Dorris Carnes, MD   Patient presents fro follow up of HTN and AS      History of Present Illness: Kelsey [name] is a 77 y.o. female with a history of chest pain (negative myoview 2019); AS, HTN, vigorous LV  function, CHB (PPM in 2019, CV dz),  UGI bleed (2021)    Echo in July 2023 mild to mod AS.   Mild MR   Normal LVEF       I saw the pt in Aug 2023   She was seen by G taylor in Dec 2023   Since seen she has had occasional chest tightness in L chest.   Feels weak.  No dizziness   Breathing is not what it was    Followed at [facility]     Current Meds  Medication Sig   amLODipine (NORVASC) 10 MG tablet Take 10 mg by mouth daily.   atorvastatin (LIPITOR) 40 MG tablet Take 40 mg by mouth every evening.   Calcium Carb-Cholecalciferol (CALCIUM + D3 PO) Take 1 capsule by mouth daily.   chlorthalidone (HYGROTON) 25 MG tablet TAKE 1 TABLET BY MOUTH EVERY DAY   Cholecalciferol (VITAMIN D) 50 MCG (2000 UT) tablet Take 2,000 Units by mouth daily.   EPINEPHrine 0.3 mg/0.3 mL IJ SOAJ injection Inject 0.3 mg into the muscle as needed for anaphylaxis.   ferrous sulfate 325 (65 FE) MG EC tablet Take 325 mg by mouth daily.   HYDROcodone-acetaminophen (NORCO) 10-325 MG tablet Take 1 tablet by mouth 2 (two) times daily as needed for severe pain.   magnesium oxide (MAG-OX) 400 (240 Mg) MG tablet Take 400 mg by mouth daily.   metoprolol succinate (TOPROL-XL) 25 MG 24 hr tablet Take 25 mg by mouth daily.   Multiple Vitamins-Minerals (MULTIVITAMIN WITH MINERALS) tablet Take 1 tablet by mouth daily.   pantoprazole (PROTONIX) 40 MG tablet Take 40 mg by mouth daily.   potassium chloride SA (KLOR-CON M) 20 MEQ tablet Take 20 mEq by mouth daily.   prednisoLONE acetate (PRED FORTE) 1 % ophthalmic suspension Place 1 drop into the left eye daily.   timolol (TIMOPTIC)  0.5 % ophthalmic solution Place 1 drop into the left eye 2 (two) times daily.     Allergies:   Bee venom and Oxycodone-acetaminophen   Past Medical History:  Diagnosis Date   Aortic stenosis    Echo 7/22: EF 60-65, no RWMA, GRII DD, normal RVSF, RVSP 36.6, mild LAE, mild MR, mild-moderate TR, mild AI, moderate aortic stenosis (mean 24 mmHg, Vmax 349 cm/s, DI 0.45) // Echo 12/2021: EF 60-65, no RWMA, mod asymmetric LVH, GR 1 DD, GLS -19.2, normal RVSF, mild elevated PASP (40.5), mild LAE, mild MR, mod TR, mild to mod AI, mod AS (mean 23 mmHg, V-max 305 cm/s, DI 0.39)   Arthritis    OA   Heart murmur    refer to cardiologist note from dr Dorris Carnes   Hyperlipidemia    Hypertension    Left ureteral calculus    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   CHB   Wears dentures    upper  and partial lower   Wears glasses     Past Surgical History:  Procedure Laterality Date   ABDOMINAL HYSTERECTOMY     CARDIOVASCULAR STRESS TEST  03-31-2012   normal  perfusion study/  no ischemia/  ef 69%   COLONOSCOPY WITH ESOPHAGOGASTRODUODENOSCOPY (EGD)  09-21-2003   CORNEAL TRANSPLANT Left 2002   CYSTOSCOPY WITH URETEROSCOPY AND STENT PLACEMENT Left 01/07/2014   Procedure: CYSTOSCOPY WITH LEFT RETRGRADE, URETEROSCOPY , AND LASER LITHOTRIPSY STONE EXTRACTION AND LEFT STENT PLACEMENT;  Surgeon: Ardis Hughs, MD;  Location: [facility];  Service: Urology;  Laterality: Left;   DEBRIDEMENT AND CLOSURE WOUND N/A 06/19/2022   Procedure: CLOSURE OF ABDOMINAL WOUND;  Surgeon: Irene Limbo, MD;  Location: [place];  Service: Plastics;  Laterality: N/A;   ESOPHAGOGASTRODUODENOSCOPY N/A 05/19/2020   Procedure: ESOPHAGOGASTRODUODENOSCOPY (EGD);  Surgeon: Clarene Essex, MD;  Location: [facility];  Service: Endoscopy;  Laterality: N/A;   FOREIGN BODY REMOVAL Left 02/10/2020   Procedure: Removal of retained foreign body of left great toe;  Surgeon: Wylene Simmer, MD;  Location: [place];  Service: Orthopedics;  Laterality: Left;  61min   HOLMIUM LASER APPLICATION Left 0/62/3762   Procedure: HOLMIUM LASER APPLICATION;  Surgeon: Ardis Hughs, MD;  Location: [facility];  Service: Urology;  Laterality: Left;   PACEMAKER IMPLANT N/A 07/15/2018   Procedure: PACEMAKER IMPLANT;  Surgeon: Evans Lance, MD;  Location: [place] [facility];  Service: Cardiovascular;  Laterality: N/A;   PANNICULECTOMY N/A 06/03/2022   Procedure: PANNICULECTOMY;  Surgeon: Irene Limbo, MD;  Location: [place];  Service: Plastics;  Laterality: N/A;   REVISION TOTAL KNEE ARTHROPLASTY  left 08-01-2003/  right 02-20-02006   TOTAL KNEE ARTHROPLASTY  left 03-30-2003/  right 11-05-2004   post op left knee I & D arthroscopic lavage 04-20-2003   TRANSTHORACIC ECHOCARDIOGRAM  08-28-2011  dr Nevin Bloodgood Aarit [place]   moderate lvh/  ef 83-15%/  grade 2 diastolic dysfunction/ normal lv function / hyperdynamic lv with outflow murmur / turbulance through LVOT , mild SAM/ moderate lae/ moderate tr/  trivial mv & pv     Social History:  The patient  reports that she quit smoking about 4 months ago. Her smoking use included cigarettes. She has never used smokeless tobacco. She reports current alcohol use of about 3.0 standard drinks of alcohol per week. She reports that she does not use drugs.   Family History:  The patient's family history includes Hypertension in her brother and father.    ROS:  Please see the history of present illness. All other systems are reviewed and  Negative to the above problem except as noted.    PHYSICAL EXAM: VS:  BP 110/64   Pulse 72   Ht 5\' 4"  (1.626 m)   Wt 177 lb (80.3 kg)   SpO2 99%   BMI 30.38 kg/m   GEN: Well nourished, well developed, in no acute distress  HEENT: normal  Neck: no JVD, carotid bruits, Cardiac: RRR;   GrIII/VI systolic murmur  LSB to base   No LE edema   Respiratory:  clear to auscultation bilaterally, normal work of breathing GI:  soft, nontender, nondistended, + BS  No hepatomegaly  MS: no deformity Moving all extremities   Skin: warm and dry, no rash Neuro:  Strength and sensation are intact Psych: euthymic mood, full affect   EKG:  EKG is not ordered today.   'Echo  12/2021   1. Left ventricular ejection fraction, by estimation, is 60 to 65%. The  left ventricle has normal function. The left ventricle has no regional  wall motion abnormalities. There is moderate asymmetric left ventricular  hypertrophy of the basal-septal  segment. Left ventricular diastolic parameters are consistent with Grade I  diastolic dysfunction (impaired relaxation). The average left ventricular  global longitudinal strain is -19.2 %. The global longitudinal strain is  normal.   2. Right ventricular systolic function is normal. The right ventricular  size is normal. There is mildly elevated pulmonary artery systolic  pressure. The estimated right ventricular systolic pressure is 62.9 mmHg.   3. Left atrial size was mildly dilated.   4. The mitral valve is grossly normal. Mild mitral valve regurgitation.   5. Tricuspid valve regurgitation is moderate.   6. The aortic valve is tricuspid. There is moderate calcification of the  aortic valve. There is moderate thickening of the aortic valve. Aortic  valve regurgitation is mild to moderate. Moderate aortic valve stenosis.  Aortic valve area, by VTI measures  1.11 cm. Aortic valve mean gradient measures 23.0 mmHg. Aortic valve Vmax  measures 3.05 m/s.   7. The inferior vena cava is normal in size with greater than 50%  respiratory variability, suggesting right atrial pressure of 3 mmHg.   Comparison(s): Compared to prior TTE 12/2020, there is no significant  change. Prior mean AoV gradient 54mmHg which is stable on current study.  AI now appears mild-to-moderate (previously mild).  Lipid Panel    Component Value Date/Time   CHOL 168 05/31/2018 0652   CHOL 140 07/24/2017 1307    TRIG 69 05/31/2018 0652   HDL 73 05/31/2018 0652   HDL 63 07/24/2017 1307   CHOLHDL 2.3 05/31/2018 0652   VLDL 14 05/31/2018 0652   LDLCALC 81 05/31/2018 0652   LDLCALC 62 07/24/2017 1307   LDLDIRECT 141.4 04/12/2013 1030      Wt Readings from Last 3 Encounters:  07/19/22 177 lb (80.3 kg)  06/19/22 167 lb (75.8 kg)  06/15/22 167 lb (75.8 kg)      ASSESSMENT AND PLAN:  1  Hx dyspnea,   Check CBC, BMET ESR (has been elevated)  2   AS/AI  Repeat echo in June   3  s/p PPM   for CHB  Follows with EP  4  HL Lipomed     5  HTN  BP is excellent     6  Hx of dizziness    Admission in 2019   NO CVA    Denies dizziness  Plan for follow up in 6 months    Current medicines are reviewed at length with the patient today.  The patient does not have concerns regarding medicines.  Signed, Dorris Carnes, MD  07/20/2022 8:14 PM    [facility] [place], [place], [place]  [address] Phone: [phone]; Fax: [phone]

## 2022-07-19 ENCOUNTER — Ambulatory Visit: Payer: Self-pay | Admitting: Internal Medicine

## 2022-07-19 ENCOUNTER — Encounter: Payer: Self-pay | Admitting: Internal Medicine

## 2022-07-19 ENCOUNTER — Ambulatory Visit: Payer: Medicare PPO | Attending: Internal Medicine | Admitting: Internal Medicine

## 2022-07-19 VITALS — BP 110/64 | HR 72 | Ht 64.0 in | Wt 177.0 lb

## 2022-07-19 DIAGNOSIS — I10 Essential (primary) hypertension: Secondary | ICD-10-CM | POA: Diagnosis not present

## 2022-07-19 DIAGNOSIS — I350 Nonrheumatic aortic (valve) stenosis: Secondary | ICD-10-CM | POA: Diagnosis not present

## 2022-07-19 DIAGNOSIS — E782 Mixed hyperlipidemia: Secondary | ICD-10-CM

## 2022-07-19 DIAGNOSIS — Z950 Presence of cardiac pacemaker: Secondary | ICD-10-CM

## 2022-07-19 DIAGNOSIS — R0600 Dyspnea, unspecified: Secondary | ICD-10-CM

## 2022-07-19 LAB — CBC WITH DIFFERENTIAL/PLATELET

## 2022-07-19 NOTE — Patient Instructions (Addendum)
Medication Instructions:  Your physician recommends that you continue on your current medications as directed. Please refer to the Current Medication list given to you today.  *If you need a refill on your cardiac medications before your next appointment, please call your pharmacy*  Lab Work: Your physician recommends that you have lab work today- CBC, BMET, Sed Rate, Lipo, A1c If you have labs (blood work) drawn today and your tests are completely normal, you will receive your results only by: MyChart Message (if you have MyChart) OR A paper copy in the mail If you have any lab test that is abnormal or we need to change your treatment, we will call you to review the results.  Testing/Procedures: Your physician has requested that you have an echocardiogram in June. Echocardiography is a painless test that uses sound waves to create images of your heart. It provides your doctor with information about the size and shape of your heart and how well your heart's chambers and valves are working. This procedure takes approximately one hour. There are no restrictions for this procedure. Please do NOT wear cologne, perfume, aftershave, or lotions (deodorant is allowed). Please arrive 15 minutes prior to your appointment time.  Follow-Up: At [facility], you and your health needs are our priority.  As part of our continuing mission to provide you with exceptional heart care, we have created designated Provider Care Teams.  These Care Teams include your primary Cardiologist (physician) and Advanced Practice Providers (APPs -  Physician Assistants and Nurse Practitioners) who all work together to provide you with the care you need, when you need it.  We recommend signing up for the patient portal called "MyChart".  Sign up information is provided on this After Visit Summary.  MyChart is used to connect with patients for Virtual Visits (Telemedicine).  Patients are able to view lab/test results,  encounter notes, upcoming appointments, etc.  Non-urgent messages can be sent to your provider as well.   To learn more about what you can do with MyChart, go to [email or web address].    Your next appointment:   4 month(s) after echocardiogram.   Provider:   Dorris Carnes, MD

## 2022-07-22 LAB — NMR, LIPOPROFILE
Cholesterol, Total: 188 mg/dL (ref 100–199)
HDL Particle Number: 39.1 umol/L (ref >=30.5)
HDL-C: 102 mg/dL (ref >39)
LDL Particle Number: 707 nmol/L (ref <1000)
LDL Size: 21.4 nm (ref >20.5)
LDL-C (NIH Calc): 65 mg/dL (ref 0–99)
LP-IR Score: <25 (ref <=45)
Small LDL Particle Number: <90 nmol/L (ref <=527)
Triglycerides: 126 mg/dL (ref 0–149)

## 2022-07-22 LAB — BASIC METABOLIC PANEL
BUN/Creatinine Ratio: 38 — ABNORMAL HIGH (ref 12–28)
BUN: 30 mg/dL — ABNORMAL HIGH (ref 8–27)
CO2: 24 mmol/L (ref 20–29)
Calcium: 10.1 mg/dL (ref 8.7–10.3)
Chloride: 106 mmol/L (ref 96–106)
Creatinine, Ser: 0.8 mg/dL (ref 0.57–1.00)
Glucose: 106 mg/dL — ABNORMAL HIGH (ref 70–99)
Potassium: 4.1 mmol/L (ref 3.5–5.2)
Sodium: 141 mmol/L (ref 134–144)
eGFR: 77 mL/min/{1.73_m2} (ref >59)

## 2022-07-22 LAB — CBC WITH DIFFERENTIAL/PLATELET
Basophils Absolute: 0 10*3/uL (ref 0.0–0.2)
Basos: 1 %
EOS (ABSOLUTE): 0.1 10*3/uL (ref 0.0–0.4)
Eos: 3 %
Hematocrit: 32.8 % — ABNORMAL LOW (ref 34.0–46.6)
Hemoglobin: 10.5 g/dL — ABNORMAL LOW (ref 11.1–15.9)
Immature Grans (Abs): 0 10*3/uL (ref 0.0–0.1)
Immature Granulocytes: 0 %
Lymphocytes Absolute: 2.2 10*3/uL (ref 0.7–3.1)
Lymphs: 44 %
MCH: 28.5 pg (ref 26.6–33.0)
MCHC: 32 g/dL (ref 31.5–35.7)
MCV: 89 fL (ref 79–97)
Monocytes Absolute: 0.4 10*3/uL (ref 0.1–0.9)
Monocytes: 7 %
Neutrophils Absolute: 2.2 10*3/uL (ref 1.4–7.0)
Neutrophils: 45 %
Platelets: 289 10*3/uL (ref 150–450)
RBC: 3.68 x10E6/uL — ABNORMAL LOW (ref 3.77–5.28)
RDW: 14.2 % (ref 11.7–15.4)
WBC: 4.9 10*3/uL (ref 3.4–10.8)

## 2022-07-22 LAB — SEDIMENTATION RATE: Sed Rate: 23 mm/hr (ref 0–40)

## 2022-07-22 LAB — HEMOGLOBIN A1C
Est. average glucose Bld gHb Est-mCnc: 88 mg/dL
Hgb A1c MFr Bld: 4.7 % — ABNORMAL LOW (ref 4.8–5.6)

## 2022-07-29 NOTE — Progress Notes (Signed)
Remote pacemaker transmission.   

## 2022-08-01 ENCOUNTER — Ambulatory Visit
Admission: EM | Admit: 2022-08-01 | Discharge: 2022-08-01 | Disposition: A | Discharge status: Home / Self Care | Payer: Medicare HMO | Attending: Physician Assistant | Admitting: Physician Assistant

## 2022-08-01 DIAGNOSIS — L299 Pruritus, unspecified: Secondary | ICD-10-CM

## 2022-08-01 DIAGNOSIS — L2389 Allergic contact dermatitis due to other agents: Secondary | ICD-10-CM | POA: Diagnosis not present

## 2022-08-01 MED ORDER — TRIAMCINOLONE ACETONIDE 0.1 % EX OINT: TOPICAL_OINTMENT | Freq: Two times a day (BID) | CUTANEOUS | 0 refills | Status: AC

## 2022-08-01 NOTE — Discharge Instructions (Signed)
Advised to use the cream and apply to the areas 2-3 times a day on a regular basis.  This cream should help decrease the itching and the irritation on the hands.  Advised to follow-up PCP or return to urgent care as needed.

## 2022-08-01 NOTE — ED Triage Notes (Signed)
Pt present rash on both hands and face, symptoms started three weeks ago. Pt has tried home remedies to help with the itching and burning.

## 2022-08-01 NOTE — ED Provider Notes (Signed)
[facility]    CSN: [id number] Arrival date & time: 08/01/22  1303      History   Chief Complaint Chief Complaint  Patient presents with   Rash    HPI Kelsey [name] is a 77 y.o. female.   54-year-old female presents with rash bilateral hands.  Patient indicates for the past 3 weeks she has been having persistent rash posterior aspect of both hands.  She indicates the areas itch, mild peeling, and mild fissuring of the skin.  Patient indicates she has used the different types of OTC creams without any improvement in the symptoms.  Patient indicates that the hands are the only area where she has the rash.  Patient indicates she has not used any new detergents, shampoos, and has not being exposed to water on a regular basis.  She does not have any rash in any other areas.   Rash   Past Medical History:  Diagnosis Date   Aortic stenosis    Echo 7/22: EF 60-65, no RWMA, GRII DD, normal RVSF, RVSP 36.6, mild LAE, mild MR, mild-moderate TR, mild AI, moderate aortic stenosis (mean 24 mmHg, Vmax 349 cm/s, DI 0.45) // Echo 12/2021: EF 60-65, no RWMA, mod asymmetric LVH, GR 1 DD, GLS -19.2, normal RVSF, mild elevated PASP (40.5), mild LAE, mild MR, mod TR, mild to mod AI, mod AS (mean 23 mmHg, V-max 305 cm/s, DI 0.39)   Arthritis    OA   Heart murmur    refer to cardiologist note from dr Dorris Carnes   Hyperlipidemia    Hypertension    Left ureteral calculus    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   CHB   Wears dentures    upper  and partial lower   Wears glasses     Patient Active Problem List   Diagnosis Date Noted   Panniculitis 06/03/2022   GIB (gastrointestinal bleeding) 05/19/2020   Pacemaker 08/31/2019   Complete heart block ([place]) 07/15/2018   TIA (transient ischemic attack) 06/03/2018   Dizziness 06/03/2018   CVA (cerebral vascular accident) ([place]) 05/31/2018   Bradycardia 05/27/2018   Aortic stenosis 05/27/2018   Osteoarthritis of both  knees 07/02/2013   Tobacco abuse 04/01/2012   Malignant HTN with heart disease, w/o CHF, 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 04/01/2012   Midsternal chest pain 04/01/2012   CHEST PAIN-UNSPECIFIED 03/09/2010   Hyperlipidemia 10/13/2009   HYPERTENSION, BENIGN 10/13/2009   UNSPECIFIED TACHYCARDIA 10/13/2009   DYSPNEA 10/13/2009    Past Surgical History:  Procedure Laterality Date   ABDOMINAL HYSTERECTOMY     CARDIOVASCULAR STRESS TEST  03-31-2012   normal perfusion study/  no ischemia/  ef 69%   COLONOSCOPY WITH ESOPHAGOGASTRODUODENOSCOPY (EGD)  09-21-2003   CORNEAL TRANSPLANT Left 2002   CYSTOSCOPY WITH URETEROSCOPY AND STENT PLACEMENT Left 01/07/2014   Procedure: CYSTOSCOPY WITH LEFT RETRGRADE, URETEROSCOPY , AND LASER LITHOTRIPSY STONE EXTRACTION AND LEFT STENT PLACEMENT;  Surgeon: Ardis Hughs, MD;  [facility]  Service: Urology;  Laterality: Left;   DEBRIDEMENT AND CLOSURE WOUND N/A 06/19/2022   Procedure: CLOSURE OF ABDOMINAL WOUND;  Surgeon: Irene Limbo, MD;  Location: [place];  Service: Plastics;  Laterality: N/A;   ESOPHAGOGASTRODUODENOSCOPY N/A 05/19/2020   Procedure: ESOPHAGOGASTRODUODENOSCOPY (EGD);  Surgeon: Clarene Essex, MD;  Location: [facility];  Service: Endoscopy;  Laterality: N/A;   FOREIGN BODY REMOVAL Left 02/10/2020   Procedure: Removal of retained foreign body of left great toe;  Surgeon: Wylene Simmer, MD;  Location:  [place];  Service: Orthopedics;  Laterality: Left;  36mn   HOLMIUM LASER APPLICATION Left [id number]  Procedure: HOLMIUM LASER APPLICATION;  Surgeon: BArdis Hughs MD;  [facility]  Service: Urology;  Laterality: Left;   PACEMAKER IMPLANT N/A 07/15/2018   Procedure: PACEMAKER IMPLANT;  Surgeon: TEvans Lance MD;  [facility];  Service: Cardiovascular;  Laterality: N/A;   PANNICULECTOMY N/A 06/03/2022   Procedure: PANNICULECTOMY;  Surgeon: TIrene Limbo MD;   [place]  Service: Plastics;  Laterality: N/A;   REVISION TOTAL KNEE ARTHROPLASTY  left 08-01-2003/  right 02-20-02006   TOTAL KNEE ARTHROPLASTY  left 03-30-2003/  right 11-05-2004   post op left knee I & D arthroscopic lavage 04-20-2003   TRANSTHORACIC ECHOCARDIOGRAM  08-28-2011  dr pNevin Bloodgoodross   moderate lvh/  ef [id number] grade 2 diastolic dysfunction/ normal lv function / hyperdynamic lv with outflow murmur / turbulance through LVOT , mild SAM/ moderate lae/ moderate tr/  trivial mv & pv    OB History   No obstetric history on file.      Home Medications    Prior to Admission medications   Medication Sig Start Date End Date Taking? Authorizing Provider  triamcinolone 0.1% oint-Eucerin equivalent cream 1:1 mixture Apply topically 2 (two) times daily. 08/01/22  Yes JNyoka Lint PA-C  amLODipine (NORVASC) 10 MG tablet Take 10 mg by mouth daily. 08/11/19   [provider]  amoxicillin-clavulanate (AUGMENTIN) 875-125 MG tablet Take 1 tablet by mouth every 12 (twelve) hours. Patient not taking: Reported on 07/19/2022 06/15/22   CTretha Sciara MD  atorvastatin (LIPITOR) 40 MG tablet Take 40 mg by mouth every evening. 04/13/13   RFay Records MD  Calcium Carb-Cholecalciferol (CALCIUM + D3 PO) Take 1 capsule by mouth daily.    [provider]  chlorthalidone (HYGROTON) 25 MG tablet TAKE 1 TABLET BY MOUTH EVERY DAY 01/08/21   TEvans Lance MD  Cholecalciferol (VITAMIN D) 50 MCG (2000 UT) tablet Take 2,000 Units by mouth daily.    [provider]  EPINEPHrine 0.3 mg/0.3 mL IJ SOAJ injection Inject 0.3 mg into the muscle as needed for anaphylaxis.    [provider]  ferrous sulfate 325 (65 FE) MG EC tablet Take 325 mg by mouth daily. 05/06/19   [provider]  HYDROcodone-acetaminophen (NORCO) 10-325 MG tablet Take 1 tablet by mouth 2 (two) times daily as needed for severe pain. 06/19/22   TIrene Limbo MD  magnesium oxide (MAG-OX) 400  (240 Mg) MG tablet Take 400 mg by mouth daily.    [provider]  metoprolol succinate (TOPROL-XL) 25 MG 24 hr tablet Take 25 mg by mouth daily.    [provider]  Multiple Vitamins-Minerals (MULTIVITAMIN WITH MINERALS) tablet Take 1 tablet by mouth daily.    [provider]  pantoprazole (PROTONIX) 40 MG tablet Take 40 mg by mouth daily.    [provider]  potassium chloride SA (KLOR-CON M) 20 MEQ tablet Take 20 mEq by mouth daily.    [provider]  prednisoLONE acetate (PRED FORTE) 1 % ophthalmic suspension Place 1 drop into the left eye daily. 02/04/20   [provider]  timolol (TIMOPTIC) 0.5 % ophthalmic solution Place 1 drop into the left eye 2 (two) times daily. 01/24/20   [provider]    Family History Family History  Problem Relation Age of Onset   Hypertension Father  Hypertension Brother     Social History Social History   Tobacco Use   Smoking status: Former    Types: Cigarettes    Quit date: 02/28/2022    Years since quitting: 0.4   Smokeless tobacco: Never   Tobacco comments:    per pt occasional smoker  Vaping Use   Vaping Use: Never used  Substance Use Topics   Alcohol use: Yes    Alcohol/week: 3.0 standard drinks of alcohol    Types: 3 Glasses of wine per week    Comment: social   Drug use: No     Allergies   Bee venom and Oxycodone-acetaminophen   Review of Systems Review of Systems  Skin:  Positive for rash (bilat hands).     Physical Exam Triage Vital Signs ED Triage Vitals [08/01/22 1316]  Enc Vitals Group     BP (!) 146/77     Pulse Rate 70     Resp 18     Temp (!) 97.3 F (36.3 C)     Temp Source Oral     SpO2 95 %     Weight      Height      Head Circumference      Peak Flow      Pain Score 0     Pain Loc      Pain Edu?      Excl. in [place]?    No data found.  Updated Vital Signs BP (!) 146/77 (BP Location: Left Arm)   Pulse 70   Temp (!) 97.3 F (36.3 C)  (Oral)   Resp 18   SpO2 95%   Visual Acuity Right Eye Distance:   Left Eye Distance:   Bilateral Distance:    Right Eye Near:   Left Eye Near:    Bilateral Near:     Physical Exam Constitutional:      Appearance: Normal appearance.  Skin:         Comments: Lungs: Posterior aspect of hands have a reddened, excoriated, with mild fissuring in the webspaces due to scratching, mild peeling present.  The second third and fourth digits and webspaces are mostly affected, the palms are clear, the wrist and forearms are clear.  Neurological:     Mental Status: She is alert.      UC Treatments / Results  Labs (all labs ordered are listed, but only abnormal results are displayed) Labs Reviewed - No data to display  EKG   Radiology No results found.  Procedures Procedures (including critical care time)  Medications Ordered in UC Medications - No data to display  Initial Impression / Assessment and Plan / UC Course  I have reviewed the triage vital signs and the nursing notes.  Pertinent labs & imaging results that were available during my care of the patient were reviewed by me and considered in my medical decision making (see chart for details).    Plan: The diagnosis will be treated with the following: 1.  Contact dermatitis bilateral hands: A.  Triamcinolone Eucerin combination, apply to area 3 times a day to help decrease rash and itching. 2.  Itching: A.  Triamcinolone Eucerin combination applied 3 times a day to help decrease rash and itching. 3.  Advised follow-up PCP or return to urgent care as needed. Final Clinical Impressions(s) / UC Diagnoses   Final diagnoses:  Itching  Allergic contact dermatitis due to other agents     Discharge Instructions      Advised  to use the cream and apply to the areas 2-3 times a day on a regular basis.  This cream should help decrease the itching and the irritation on the hands.  Advised to follow-up PCP or return to  urgent care as needed.    ED Prescriptions     Medication Sig Dispense Auth. Provider   triamcinolone 0.1% oint-Eucerin equivalent cream 1:1 mixture Apply topically 2 (two) times daily. 45 g Nyoka Lint, PA-C      PDMP not reviewed this encounter.   Nyoka Lint, PA-C 08/01/22 1341

## 2022-10-10 ENCOUNTER — Ambulatory Visit (INDEPENDENT_AMBULATORY_CARE_PROVIDER_SITE_OTHER): Payer: Medicare HMO

## 2022-10-10 DIAGNOSIS — I495 Sick sinus syndrome: Secondary | ICD-10-CM | POA: Diagnosis not present

## 2022-10-10 LAB — CUP PACEART REMOTE DEVICE CHECK
Battery Remaining Longevity: 134 mo
Battery Voltage: 3.01 V
Brady Statistic AP VP Percent: 0.1 %
Brady Statistic AP VS Percent: 14.41 %
Brady Statistic AS VP Percent: 1.41 %
Brady Statistic AS VS Percent: 84.08 %
Brady Statistic RA Percent Paced: 14.53 %
Brady Statistic RV Percent Paced: 1.51 %
Date Time Interrogation Session: 20240425093730
Implantable Lead Connection Status: 753985
Implantable Lead Connection Status: 753985
Implantable Lead Implant Date: 20200129
Implantable Lead Implant Date: 20200129
Implantable Lead Location: 753859
Implantable Lead Location: 753860
Implantable Lead Model: 3830
Implantable Lead Model: 5076
Implantable Pulse Generator Implant Date: 20200129
Lead Channel Impedance Value: 342 Ohm
Lead Channel Impedance Value: 342 Ohm
Lead Channel Impedance Value: 380 Ohm
Lead Channel Impedance Value: 494 Ohm
Lead Channel Pacing Threshold Amplitude: 0.5 V
Lead Channel Pacing Threshold Amplitude: 0.75 V
Lead Channel Pacing Threshold Pulse Width: 0.4 ms
Lead Channel Pacing Threshold Pulse Width: 0.4 ms
Lead Channel Sensing Intrinsic Amplitude: 17.5 mV
Lead Channel Sensing Intrinsic Amplitude: 17.5 mV
Lead Channel Sensing Intrinsic Amplitude: 2.25 mV
Lead Channel Sensing Intrinsic Amplitude: 2.25 mV
Lead Channel Setting Pacing Amplitude: 1.5 V
Lead Channel Setting Pacing Amplitude: 2 V
Lead Channel Setting Pacing Pulse Width: 0.4 ms
Lead Channel Setting Sensing Sensitivity: 1.2 mV
Zone Setting Status: 755011
Zone Setting Status: 755011

## 2022-11-05 NOTE — Progress Notes (Signed)
Remote pacemaker transmission.   

## 2022-11-28 ENCOUNTER — Ambulatory Visit (HOSPITAL_COMMUNITY): Payer: Medicare HMO | Attending: Cardiology

## 2022-11-28 DIAGNOSIS — E782 Mixed hyperlipidemia: Secondary | ICD-10-CM | POA: Diagnosis not present

## 2022-11-28 DIAGNOSIS — I10 Essential (primary) hypertension: Secondary | ICD-10-CM | POA: Diagnosis present

## 2022-11-28 DIAGNOSIS — I350 Nonrheumatic aortic (valve) stenosis: Secondary | ICD-10-CM | POA: Diagnosis present

## 2022-11-28 DIAGNOSIS — R0600 Dyspnea, unspecified: Secondary | ICD-10-CM | POA: Insufficient documentation

## 2022-11-28 DIAGNOSIS — Z950 Presence of cardiac pacemaker: Secondary | ICD-10-CM | POA: Insufficient documentation

## 2022-11-28 LAB — ECHOCARDIOGRAM COMPLETE
AR max vel: 1.67 cm2
AV Area VTI: 1.87 cm2
AV Area mean vel: 1.66 cm2
AV Mean grad: 20.5 mmHg
AV Peak grad: 35.7 mmHg
Ao pk vel: 2.99 m/s
Area-P 1/2: 2.49 cm2
P 1/2 time: 662 msec
S' Lateral: 2.2 cm

## 2022-12-09 ENCOUNTER — Other Ambulatory Visit (HOSPITAL_COMMUNITY): Payer: Self-pay | Admitting: Internal Medicine

## 2022-12-09 DIAGNOSIS — I350 Nonrheumatic aortic (valve) stenosis: Secondary | ICD-10-CM

## 2022-12-10 NOTE — Progress Notes (Deleted)
Cardiology Office Note   Date:  12/10/2022   ID:  Kelsey, [name] 07-20-1945, MRN [id number]  PCP:  Caffie Damme, MD  Cardiologist:   Dietrich Pates, MD   Patient presents fro follow up of HTN and AS      History of Present Illness: Kelsey [name] is a 77 y.o. female with a history of chest pain (negative myoview 2019); AS, HTN, vigorous LV  function, CHB (PPM in 2019, CV dz),  UGI bleed (2021)    Echo in July 2023 mild to mod AS.   Mild MR   Normal LVEF       I saw the pt in Aug 2023   She was seen by G taylor in Dec 2023   Since seen she has had occasional chest tightness in L chest.   Feels weak.  No dizziness   Breathing is not what it was    Followed at [facility]     I saw the pt in Feb 2024    No outpatient medications have been marked as taking for the 12/12/22 encounter (Appointment) with Pricilla Riffle, MD.     Allergies:   Bee venom and Oxycodone-acetaminophen   Past Medical History:  Diagnosis Date   Aortic stenosis    Echo 7/22: EF 60-65, no RWMA, GRII DD, normal RVSF, RVSP 36.6, mild LAE, mild MR, mild-moderate TR, mild AI, moderate aortic stenosis (mean 24 mmHg, Vmax 349 cm/s, DI 0.45) // Echo 12/2021: EF 60-65, no RWMA, mod asymmetric LVH, GR 1 DD, GLS -19.2, normal RVSF, mild elevated PASP (40.5), mild LAE, mild MR, mod TR, mild to mod AI, mod AS (mean 23 mmHg, V-max 305 cm/s, DI 0.39)   Arthritis    OA   Heart murmur    refer to cardiologist note from dr Dietrich Pates   Hyperlipidemia    Hypertension    Left ureteral calculus    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   CHB   Wears dentures    upper  and partial lower   Wears glasses     Past Surgical History:  Procedure Laterality Date   ABDOMINAL HYSTERECTOMY     CARDIOVASCULAR STRESS TEST  03-31-2012   normal perfusion study/  no ischemia/  ef 69%   COLONOSCOPY WITH ESOPHAGOGASTRODUODENOSCOPY (EGD)  09-21-2003   CORNEAL TRANSPLANT Left 2002   CYSTOSCOPY WITH URETEROSCOPY AND STENT  PLACEMENT Left 01/07/2014   Procedure: CYSTOSCOPY WITH LEFT RETRGRADE, URETEROSCOPY , AND LASER LITHOTRIPSY STONE EXTRACTION AND LEFT STENT PLACEMENT;  Surgeon: Crist Fat, MD;  [facility];  Service: Urology;  Laterality: Left;   DEBRIDEMENT AND CLOSURE WOUND N/A 06/19/2022   Procedure: CLOSURE OF ABDOMINAL WOUND;  Surgeon: Glenna Fellows, MD;  [facility];  Service: Plastics;  Laterality: N/A;   ESOPHAGOGASTRODUODENOSCOPY N/A 05/19/2020   Procedure: ESOPHAGOGASTRODUODENOSCOPY (EGD);  Surgeon: Vida Rigger, MD;  Location: [facility];  Service: Endoscopy;  Laterality: N/A;   FOREIGN BODY REMOVAL Left 02/10/2020   Procedure: Removal of retained foreign body of left great toe;  Surgeon: Toni Arthurs, MD;  Location: [facility];  Service: Orthopedics;  Laterality: Left;    HOLMIUM LASER APPLICATION Left 01/07/2014   Procedure: HOLMIUM LASER APPLICATION;  Surgeon: Crist Fat, MD;  Location: [facility];  Service: Urology;  Laterality: Left;   PACEMAKER IMPLANT N/A 07/15/2018   Procedure: PACEMAKER IMPLANT;  Surgeon: Marinus Maw, MD;  [facility];  Service: Cardiovascular;  Laterality: N/A;   PANNICULECTOMY N/A 06/03/2022   Procedure: PANNICULECTOMY;  Surgeon: Glenna Fellows, MD;  [facility]: [facility];  Service: Plastics;  Laterality: N/A;   REVISION TOTAL KNEE ARTHROPLASTY  left 08-01-2003/  right 02-20-02006   TOTAL KNEE ARTHROPLASTY  left 03-30-2003/  right 11-05-2004   post op left knee I & D arthroscopic lavage 04-20-2003   TRANSTHORACIC ECHOCARDIOGRAM  08-28-2011  dr Gunnar Fusi [facility]   moderate lvh/  ef 60-65%/  grade 2 diastolic dysfunction/ normal lv function / hyperdynamic lv with outflow murmur / turbulance through LVOT , mild SAM/ moderate lae/ moderate tr/  trivial mv & pv     Social History:  The patient  reports that she quit smoking about 9 months ago. Her smoking use included cigarettes. She has  never used smokeless tobacco. She reports current alcohol use of about 3.0 standard drinks of alcohol per week. She reports that she does not use drugs.   Family History:  The patient's family history includes Hypertension in her brother and father.    ROS:  Please see the history of present illness. All other systems are reviewed and  Negative to the above problem except as noted.    PHYSICAL EXAM: VS:  There were no vitals taken for this visit.  GEN: Well nourished, well developed, in no acute distress  HEENT: normal  Neck: no JVD, carotid bruits, Cardiac: RRR;   GrIII/VI systolic murmur  LSB to base   No LE edema   Respiratory:  clear to auscultation bilaterally, normal work of breathing GI: soft, nontender, nondistended, + BS  No hepatomegaly  MS: no deformity Moving all extremities   Skin: warm and dry, no rash Neuro:  Strength and sensation are intact Psych: euthymic mood, full affect   EKG:  EKG is not ordered today.   'Echo  12/2021   1. Left ventricular ejection fraction, by estimation, is 60 to 65%. The  left ventricle has normal function. The left ventricle has no regional  wall motion abnormalities. There is moderate asymmetric left ventricular  hypertrophy of the basal-septal  segment. Left ventricular diastolic parameters are consistent with Grade I  diastolic dysfunction (impaired relaxation). The average left ventricular  global longitudinal strain is -19.2 %. The global longitudinal strain is  normal.   2. Right ventricular systolic function is normal. The right ventricular  size is normal. There is mildly elevated pulmonary artery systolic  pressure. The estimated right ventricular systolic pressure is 40.5 mmHg.   3. Left atrial size was mildly dilated.   4. The mitral valve is grossly normal. Mild mitral valve regurgitation.   5. Tricuspid valve regurgitation is moderate.   6. The aortic valve is tricuspid. There is moderate calcification of the  aortic  valve. There is moderate thickening of the aortic valve. Aortic  valve regurgitation is mild to moderate. Moderate aortic valve stenosis.  Aortic valve area, by VTI measures  1.11 cm. Aortic valve mean gradient measures 23.0 mmHg. Aortic valve Vmax  measures 3.05 m/s.   7. The inferior vena cava is normal in size with greater than 50%  respiratory variability, suggesting right atrial pressure of 3 mmHg.   Comparison(s): Compared to prior TTE 12/2020, there is no significant  change. Prior mean AoV gradient which is stable on current study.  AI now appears mild-to-moderate (previously mild).  Lipid Panel    Component Value Date/Time   CHOL 168 05/31/2018 0652   CHOL 140 07/24/2017 1307  TRIG 69 05/31/2018 0652   HDL 73 05/31/2018 0652   HDL 63 07/24/2017 1307   CHOLHDL 2.3 05/31/2018 0652   VLDL 14 05/31/2018 0652   LDLCALC 81 05/31/2018 0652   LDLCALC 62 07/24/2017 1307   LDLDIRECT 141.4 04/12/2013 1030      Wt Readings from Last 3 Encounters:  07/19/22 177 lb (80.3 kg)  06/19/22 167 lb (75.8 kg)  06/15/22 167 lb (75.8 kg)      ASSESSMENT AND PLAN:  1  Hx dyspnea,   Check CBC, BMET ESR (has been elevated)  2   AS/AI  Repeat echo in June   3  s/p PPM   for CHB  Follows with EP  4  HL Lipomed     5  HTN  BP is excellent     6  Hx of dizziness    Admission in 2019   NO CVA    Denies dizziness  Plan for follow up in 6 months    Current medicines are reviewed at length with the patient today.  The patient does not have concerns regarding medicines.  Signed, Dietrich Pates, MD  12/10/2022 5:50 PM    [facility] [address] [place], [place], [place]  [address] Phone: [phone]; Fax: [phone]

## 2022-12-11 ENCOUNTER — Telehealth: Payer: Self-pay | Admitting: Internal Medicine

## 2022-12-11 NOTE — Telephone Encounter (Signed)
Patient wants to confirm if she will need to be seen tomorrow or after her next Echocardiogram test on 7/16.  Patient stated can leave VM.

## 2022-12-11 NOTE — Telephone Encounter (Signed)
Spoke with the patient who states that Dr. Tenny Craw called her and she is having a repeat echocardiogram done on 07/16. She is currently scheduled to see Dr. Tenny Craw tomorrow 6/27. She would like to know if she should wait to see Dr. Tenny Craw until after her repeat ultrasound. Patient has been rescheduled to see Dr. Tenny Craw on 7/26.

## 2022-12-12 ENCOUNTER — Ambulatory Visit: Payer: Medicare HMO | Admitting: Internal Medicine

## 2022-12-31 ENCOUNTER — Ambulatory Visit (HOSPITAL_COMMUNITY): Payer: Medicare HMO | Attending: Cardiology

## 2022-12-31 DIAGNOSIS — I350 Nonrheumatic aortic (valve) stenosis: Secondary | ICD-10-CM

## 2022-12-31 LAB — ECHOCARDIOGRAM LIMITED
AV Mean grad: 18 mmHg
AV Peak grad: 31.9 mmHg
Ao pk vel: 2.82 m/s
Area-P 1/2: 2.95 cm2
MV M vel: 5.72 m/s
MV Peak grad: 130.9 mmHg
P 1/2 time: 574 msec
S' Lateral: 2.8 cm

## 2023-01-08 ENCOUNTER — Telehealth: Payer: Self-pay | Admitting: Internal Medicine

## 2023-01-08 NOTE — Telephone Encounter (Signed)
Patient is calling in regards to her appointment for Friday, patient is requesting a call back from the nurse.

## 2023-01-08 NOTE — Telephone Encounter (Signed)
Tell her I am sorry her husband is in the hospital   Can be a phone visit

## 2023-01-08 NOTE — Telephone Encounter (Signed)
Spoke with Kelsey [name] who is asking to review echo result.  Kelsey [name] advised per Dr Tenny Craw as below.  Kelsey [name] is scheduled to see Dr Tenny Craw 01/10/2023.  Her husband is currently in the hospital diagnosed with lung cancer.  She is asking if appointment is necessary or if it could be scheduled as a phone visit. Kelsey [name] advised will forward to dr Tenny Craw for recommendation.  Kelsey [name] verbalizes understanding and agrees with current plan.  Pricilla Riffle, MD 01/07/2023  9:08 PM EDT     Tried to call patient   No answer LV and RV function normal AV   mild to moderately narrowed   Mild to moderate regurgitation There is a prominence below aortic valve   It has been there   Unchanged   may represent a ridge in muscle   I would do noting different but follow for now

## 2023-01-08 NOTE — Telephone Encounter (Signed)
Spoke with pt and advised per Dr Tenny Craw pt's visit scheduled for 01/10/2023 may be a phone visit and expressed how sorry Dr Tenny Craw and RN are that pt and her husband going through hospitalization and cancer diagnosis.  Pt verbalized understanding and thanked Charity fundraiser for the call.

## 2023-01-09 ENCOUNTER — Ambulatory Visit (INDEPENDENT_AMBULATORY_CARE_PROVIDER_SITE_OTHER): Payer: Medicare HMO

## 2023-01-09 DIAGNOSIS — I495 Sick sinus syndrome: Secondary | ICD-10-CM

## 2023-01-09 LAB — CUP PACEART REMOTE DEVICE CHECK
Battery Remaining Longevity: 131 mo
Battery Voltage: 3.01 V
Brady Statistic AP VP Percent: 0.02 %
Brady Statistic AP VS Percent: 18.68 %
Brady Statistic AS VP Percent: 0.03 %
Brady Statistic AS VS Percent: 81.26 %
Brady Statistic RA Percent Paced: 18.74 %
Brady Statistic RV Percent Paced: 0.06 %
Date Time Interrogation Session: 20240725075054
Implantable Lead Connection Status: 753985
Implantable Lead Connection Status: 753985
Implantable Lead Implant Date: 20200129
Implantable Lead Implant Date: 20200129
Implantable Lead Location: 753859
Implantable Lead Location: 753860
Implantable Lead Model: 3830
Implantable Lead Model: 5076
Implantable Pulse Generator Implant Date: 20200129
Lead Channel Impedance Value: 304 Ohm
Lead Channel Impedance Value: 323 Ohm
Lead Channel Impedance Value: 361 Ohm
Lead Channel Impedance Value: 475 Ohm
Lead Channel Pacing Threshold Amplitude: 0.625 V
Lead Channel Pacing Threshold Amplitude: 0.75 V
Lead Channel Pacing Threshold Pulse Width: 0.4 ms
Lead Channel Pacing Threshold Pulse Width: 0.4 ms
Lead Channel Sensing Intrinsic Amplitude: 18.125 mV
Lead Channel Sensing Intrinsic Amplitude: 18.125 mV
Lead Channel Sensing Intrinsic Amplitude: 2.125 mV
Lead Channel Sensing Intrinsic Amplitude: 2.125 mV
Lead Channel Setting Pacing Amplitude: 1.5 V
Lead Channel Setting Pacing Amplitude: 2 V
Lead Channel Setting Pacing Pulse Width: 0.4 ms
Lead Channel Setting Sensing Sensitivity: 1.2 mV
Zone Setting Status: 755011
Zone Setting Status: 755011

## 2023-01-10 ENCOUNTER — Ambulatory Visit: Payer: Medicare HMO | Attending: Cardiology | Admitting: Internal Medicine

## 2023-01-10 DIAGNOSIS — R931 Abnormal findings on diagnostic imaging of heart and coronary circulation: Secondary | ICD-10-CM | POA: Diagnosis not present

## 2023-01-10 DIAGNOSIS — I350 Nonrheumatic aortic (valve) stenosis: Secondary | ICD-10-CM | POA: Diagnosis not present

## 2023-01-10 DIAGNOSIS — Z950 Presence of cardiac pacemaker: Secondary | ICD-10-CM | POA: Diagnosis not present

## 2023-01-10 NOTE — Progress Notes (Addendum)
Virtual Visit via Telephone Note   Because of JO BOLHUIS co-morbid illnesses, she is at least at moderate risk for complications without adequate follow up.  This format is felt to be most appropriate for this patient at this time.  The patient did not have access to video technology/had technical difficulties with video requiring transitioning to audio format only (telephone).  All issues noted in this document were discussed and addressed.  No physical exam could be performed with this format.  Please refer to the patient's chart for her consent to telehealth for [facility].      Cardiology Office Note   Date:  01/10/2023   ID:  Kelsey [name], Kelsey [name] [birth year], MRN [id number]  PCP:  Caffie Damme, MD  Cardiologist:   Dietrich Pates, MD   Patient presents fro follow up of HTN and AV dz      History of Present Illness: Kelsey [name] is a 77 y.o. female with a history of chest pain (negative myoview 2019); AS, HTN, vigorous LV  function, CHB (PPM in 2019, CV dz),  UGI bleed (2021)   Echo in July 2023 mild to mod AS.   Mild MR   Normal LVEF      I saw the pt in clinic in Feb 2024    At time she had occsional chest tightness, weakness.  No dizziness  Since seen she just had an echo done to reevaluate AV  The pt presents for follow up as televisit  Unable to come into clinic today  She denies significant SOB  No chest tightness  No dizziness No heart racing   Allergies:   Bee venom and Oxycodone-acetaminophen   Past Medical History:  Diagnosis Date   Aortic stenosis    Echo 7/22: EF 60-65, no RWMA, GRII DD, normal RVSF, RVSP 36.6, mild LAE, mild MR, mild-moderate TR, mild AI, moderate aortic stenosis (mean 24 mmHg, Vmax 349 cm/s, DI 0.45) // Echo 12/2021: EF 60-65, no RWMA, mod asymmetric LVH, GR 1 DD, GLS -19.2, normal RVSF, mild elevated PASP (40.5), mild LAE, mild MR, mod TR, mild to mod AI, mod AS (mean 23 mmHg, V-max 305 cm/s, DI 0.39)   Arthritis    OA    Heart murmur    refer to cardiologist note from dr Dietrich Pates   Hyperlipidemia    Hypertension    Left ureteral calculus    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   CHB   Wears dentures    upper  and partial lower   Wears glasses     Past Surgical History:  Procedure Laterality Date   ABDOMINAL HYSTERECTOMY     CARDIOVASCULAR STRESS TEST  03-31-2012   normal perfusion study/  no ischemia/  ef 69%   COLONOSCOPY WITH ESOPHAGOGASTRODUODENOSCOPY (EGD)  09-21-2003   CORNEAL TRANSPLANT Left 2002   CYSTOSCOPY WITH URETEROSCOPY AND STENT PLACEMENT Left 01/07/2014   Procedure: CYSTOSCOPY WITH LEFT RETRGRADE, URETEROSCOPY , AND LASER LITHOTRIPSY STONE EXTRACTION AND LEFT STENT PLACEMENT;  Surgeon: Crist Fat, MD;  Location[facility];  Service: Urology;  Laterality: Left;   DEBRIDEMENT AND CLOSURE WOUND N/A 06/19/2022   Procedure: CLOSURE OF ABDOMINAL WOUND;  Surgeon: Glenna Fellows, MD;  [facility]: [facility];  Service: Plastics;  Laterality: N/A;   ESOPHAGOGASTRODUODENOSCOPY N/A 05/19/2020   Procedure: ESOPHAGOGASTRODUODENOSCOPY (EGD);  Surgeon: Vida Rigger, MD;  Location: [facility];  Service: Endoscopy;  Laterality: N/A;   FOREIGN BODY REMOVAL Left 02/10/2020  Procedure: Removal of retained foreign body of left great toe;  Surgeon: Toni Arthurs, MD;  [facility];  Service: Orthopedics;  Laterality: Left;    HOLMIUM LASER APPLICATION Left 01/07/2014   Procedure: HOLMIUM LASER APPLICATION;  Surgeon: Crist Fat, MD;  Location[facility];  Service: Urology;  Laterality: Left;   PACEMAKER IMPLANT N/A 07/15/2018   Procedure: PACEMAKER IMPLANT;  Surgeon: Marinus Maw, MD;  [facility];  Service: Cardiovascular;  Laterality: N/A;   PANNICULECTOMY N/A 06/03/2022   Procedure: PANNICULECTOMY;  Surgeon: Glenna Fellows, MD;  Location: [facility];  Service: Plastics;  Laterality: N/A;   REVISION TOTAL  KNEE ARTHROPLASTY  left 08-01-2003/  right 02-20-02006   TOTAL KNEE ARTHROPLASTY  left 03-30-2003/  right 11-05-2004   post op left knee I & D arthroscopic lavage 04-20-2003   TRANSTHORACIC ECHOCARDIOGRAM  08-28-2011  dr Gunnar Fusi    moderate lvh/  ef 60-65%/  grade 2 diastolic dysfunction/ normal lv function / hyperdynamic lv with outflow murmur / turbulance through LVOT , mild SAM/ moderate lae/ moderate tr/  trivial mv & pv     Social History:  The patient  reports that she quit smoking about 10 months ago. Her smoking use included cigarettes. She has never used smokeless tobacco. She reports current alcohol use of about 3.0 standard drinks of alcohol per week. She reports that she does not use drugs.   Family History:  The patient's family history includes Hypertension in her brother and father.    ROS:  Please see the history of present illness. All other systems are reviewed and  Negative to the above problem except as noted.    PHYSICAL EXAM:  No vital signs today   Physical exam not performed as televisit   EKG:  None  ordered today.   Echo  01/09/23  1. Left ventricular ejection fraction, by estimation, is 60 to 65%. The  left ventricle has normal function. The left ventricle has no regional  wall motion abnormalities. There is mild left ventricular hypertrophy of  the basal-septal segment.   2. Right ventricular systolic function is normal. The right ventricular  size is normal. There is normal pulmonary artery systolic pressure. The  estimated right ventricular systolic pressure is 24.2 mmHg.   3. The mitral valve is normal in structure. Mild mitral valve  regurgitation. No evidence of mitral stenosis.   4. The aortic valve is calcified. There is mild calcification of the  aortic valve. There is mild thickening of the aortic valve. Aortic valve  regurgitation is mild to moderate. Mild to moderate aortic valve stenosis.  Aortic regurgitation PHT measures  574 msec.  Aortic valve mean gradient measures 18.0 mmHg. Aortic valve Vmax  measures 2.82 m/s.   5. The inferior vena cava is normal in size with greater than 50%  respiratory variability, suggesting right atrial pressure of 3 mmHg.   Echo   June 2024  1. 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  Differential diagnosis includes papillary fibroelastoma, etiology may be  from anterior mitral leaflet contact with  basal septum. Less likely, may represent subaortic membrane. Consider TEE  if clinically indicated to evaluate further.   2. Left ventricular ejection fraction, by estimation, is 60 to 65%. The  left ventricle has normal function. The left ventricle has no regional  wall motion  abnormalities. Left ventricular diastolic parameters are  consistent with Grade I diastolic  dysfunction (impaired relaxation).   3. Right ventricular systolic function is normal. The right ventricular  size is normal. There is normal pulmonary artery systolic pressure. The  estimated right ventricular systolic pressure is 28.0 mmHg.   4. The mitral valve is grossly normal. Mild mitral valve regurgitation.  No evidence of mitral stenosis.   5. The aortic valve is abnormal. There is severe calcifcation of the  aortic valve. Aortic valve regurgitation is mild. Moderate aortic valve  stenosis. Aortic valve area, by VTI measures 1.87 cm. Aortic valve mean  gradient measures 20.5 mmHg. Aortic  valve Vmax measures 2.99 m/s.   6. The inferior vena cava is normal in size with greater than 50%  respiratory variability, suggesting right atrial pressure of 3 mmHg.   Lipid Panel    Component Value Date/Time   CHOL 168 05/31/2018 0652   CHOL 140 07/24/2017 1307   TRIG 69 05/31/2018 0652   HDL 73 05/31/2018 0652   HDL 63 07/24/2017 1307   CHOLHDL 2.3 05/31/2018 0652   VLDL 14 05/31/2018 0652   LDLCALC 81 05/31/2018 0652    LDLCALC 62 07/24/2017 1307   LDLDIRECT 141.4 04/12/2013 1030      Wt Readings from Last 3 Encounters:  07/19/22 177 lb (80.3 kg)  06/19/22 167 lb (75.8 kg)  06/15/22 167 lb (75.8 kg)      ASSESSMENT AND PLAN:  1  Abnormal echo  Echoes done in June and now this month   On review and in comparison to previous echo the pt has what appears to be a ridge in LVOT    It has been present in previous echoes   I have reviewed with colleagues  Does not appear changed   Does not appear to be causing any hemodynamic effect   I do not think it represents a fibroelastoma    I have reviewed with pt   I would recomm continue follow up with TTE       2  AV dz   pt with mod AS and mild AI   Will continue to follow with periodic echos  Plan for next spring   3  Hx  dyspnea,  Work up last visit pt with mild anemia  Otherwise neg    She deneis signficant SOB today   Will need to continue to follow labs, exam in future  I do not think she is symptomatic from AS  3  s/p PPM   for CHB  Follows with EP  4  HL Lipomed   in Feb LDL was excellent at 65, HDL 102  Trig 126  5  HTN  BP has been good  Will continue to follow    6  Hx of dizziness    Admission in 2019   NO CVA    Denies dizziness  Follow up in spring  Time:   Today, I have spent 20  minutes with the patient with telehealth technology discussing the above problems.    Current medicines are reviewed at length with the patient today.  The patient does not have concerns regarding medicines.  Signed, Dietrich Pates, MD  01/10/2023 10:46 AM    [facility] [address] [place], [place], [place]  [address] Phone: [phone]; Fax: [phone]

## 2023-01-17 NOTE — Progress Notes (Signed)
Remote pacemaker transmission.   

## 2023-01-27 ENCOUNTER — Telehealth: Payer: Self-pay | Admitting: Internal Medicine

## 2023-01-27 NOTE — Telephone Encounter (Signed)
Patient states she has been having SOB and dizziness with activity for the last 3-4 days. She states she does not have it every day, but it is new and she wanted to report it. She states lying down helps resolve SOB. Denies any chest pain.  Patient reports BP WNL, did not have any readings to share.  She would like to know if Dr. Tenny Craw recommends she come in to be evaluated.  Will forward to Dr. Tenny Craw to review and advise.

## 2023-01-27 NOTE — Telephone Encounter (Signed)
I am in clinic tomorrow   COuld be added on at end of patients

## 2023-01-27 NOTE — Telephone Encounter (Signed)
Pt c/o Shortness Of Breath: STAT if SOB developed within the last 24 hours or pt is noticeably SOB on the phone  1. Are you currently SOB (can you hear that pt is SOB on the phone)?  No   2. How long have you been experiencing SOB?  3-4 days   3. Are you SOB when sitting or when up moving around?  When up and moving around   4. Are you currently experiencing any other symptoms?  No symptoms currently, but patient mentions dizziness for 3-4 days with movement also

## 2023-01-29 NOTE — Telephone Encounter (Signed)
Pt to see Dr Tenny Craw 01/31/23... she declined coming in on 01/30/23.

## 2023-01-31 ENCOUNTER — Ambulatory Visit: Payer: Medicare HMO | Admitting: Internal Medicine

## 2023-01-31 ENCOUNTER — Encounter: Payer: Self-pay | Admitting: Internal Medicine

## 2023-01-31 VITALS — BP 104/82 | HR 64 | Ht 64.0 in | Wt 178.0 lb

## 2023-01-31 DIAGNOSIS — R0600 Dyspnea, unspecified: Secondary | ICD-10-CM

## 2023-01-31 DIAGNOSIS — D649 Anemia, unspecified: Secondary | ICD-10-CM

## 2023-01-31 DIAGNOSIS — I959 Hypotension, unspecified: Secondary | ICD-10-CM

## 2023-01-31 MED ORDER — CHLORTHALIDONE 25 MG PO TABS: 12.5000 mg | ORAL_TABLET | Freq: Every day | ORAL | Status: DC

## 2023-01-31 MED ORDER — AMLODIPINE BESYLATE 5 MG PO TABS: 5.0000 mg | ORAL_TABLET | Freq: Every day | ORAL | Status: DC

## 2023-01-31 NOTE — Progress Notes (Unsigned)
Cardiology Office Note   Date:  01/31/2023   ID:  Kelsey, [name] February 10, 1946, MRN [id number]  PCP:  Caffie Damme, MD  Cardiologist:   Dietrich Pates, MD   Patient presents fro follow up of HTN and AS      History of Present Illness: Kelsey [name] is a 77 y.o. female with a history of chest pain (negative myoview 2019); AS, HTN, vigorous LV  function, CHB (PPM in 2019, CV dz),  UGI bleed (2021)    Echo in July 2023 mild to mod AS.   Mild MR   Normal LVEF       I saw the pt in Aug 2023   She was seen by G taylor in Dec 2023   Since seen she has had occasional chest tightness in L chest.   Feels weak.  No dizziness   Breathing is not what it was    Followed at [facility]     Current Meds  Medication Sig   amLODipine (NORVASC) 10 MG tablet Take 10 mg by mouth daily.   atorvastatin (LIPITOR) 40 MG tablet Take 40 mg by mouth every evening.   Calcium Carb-Cholecalciferol (CALCIUM + D3 PO) Take 1 capsule by mouth daily.   chlorthalidone (HYGROTON) 25 MG tablet TAKE 1 TABLET BY MOUTH EVERY DAY   Cholecalciferol (VITAMIN D) 50 MCG (2000 UT) tablet Take 2,000 Units by mouth daily.   ferrous sulfate 325 (65 FE) MG EC tablet Take 325 mg by mouth daily.   HYDROcodone-acetaminophen (NORCO) 10-325 MG tablet Take 1 tablet by mouth 2 (two) times daily as needed for severe pain.   magnesium oxide (MAG-OX) 400 (240 Mg) MG tablet Take 400 mg by mouth daily.   metoprolol succinate (TOPROL-XL) 25 MG 24 hr tablet Take 25 mg by mouth daily.   Multiple Vitamins-Minerals (MULTIVITAMIN WITH MINERALS) tablet Take 1 tablet by mouth daily.   pantoprazole (PROTONIX) 40 MG tablet Take 40 mg by mouth daily.   potassium chloride SA (KLOR-CON M) 20 MEQ tablet Take 20 mEq by mouth daily.   prednisoLONE acetate (PRED FORTE) 1 % ophthalmic suspension Place 1 drop into the left eye daily.   timolol (TIMOPTIC) 0.5 % ophthalmic solution Place 1 drop into the left eye 2 (two) times daily.   triamcinolone 0.1%  oint-Eucerin equivalent cream 1:1 mixture Apply topically 2 (two) times daily.     Allergies:   Bee venom and Oxycodone-acetaminophen   Past Medical History:  Diagnosis Date   Aortic stenosis    Echo 7/22: EF 60-65, no RWMA, GRII DD, normal RVSF, RVSP 36.6, mild LAE, mild MR, mild-moderate TR, mild AI, moderate aortic stenosis (mean 24 mmHg, Vmax 349 cm/s, DI 0.45) // Echo 12/2021: EF 60-65, no RWMA, mod asymmetric LVH, GR 1 DD, GLS -19.2, normal RVSF, mild elevated PASP (40.5), mild LAE, mild MR, mod TR, mild to mod AI, mod AS (mean 23 mmHg, V-max 305 cm/s, DI 0.39)   Arthritis    OA   Heart murmur    refer to cardiologist note from dr Dietrich Pates   Hyperlipidemia    Hypertension    Left ureteral calculus    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   CHB   Wears dentures    upper  and partial lower   Wears glasses     Past Surgical History:  Procedure Laterality Date   ABDOMINAL HYSTERECTOMY     CARDIOVASCULAR STRESS TEST  03-31-2012   normal perfusion study/  no  ischemia/  ef 69%   COLONOSCOPY WITH ESOPHAGOGASTRODUODENOSCOPY (EGD)  09-21-2003   CORNEAL TRANSPLANT Left 2002   CYSTOSCOPY WITH URETEROSCOPY AND STENT PLACEMENT Left 01/07/2014   Procedure: CYSTOSCOPY WITH LEFT RETRGRADE, URETEROSCOPY , AND LASER LITHOTRIPSY STONE EXTRACTION AND LEFT STENT PLACEMENT;  Surgeon: Crist Fat, MD;  Location: [facility];  Service: Urology;  Laterality: Left;   DEBRIDEMENT AND CLOSURE WOUND N/A 06/19/2022   Procedure: CLOSURE OF ABDOMINAL WOUND;  Surgeon: Glenna Fellows, MD;  Location: [facility];  Service: Plastics;  Laterality: N/A;   ESOPHAGOGASTRODUODENOSCOPY N/A 05/19/2020   Procedure: ESOPHAGOGASTRODUODENOSCOPY (EGD);  Surgeon: Vida Rigger, MD;  Location: [facility];  Service: Endoscopy;  Laterality: N/A;   FOREIGN BODY REMOVAL Left 02/10/2020   Procedure: Removal of retained foreign body of left great toe;  Surgeon: Toni Arthurs, MD;  Location: [facility];  Service: Orthopedics;  Laterality: Left;    HOLMIUM LASER APPLICATION Left 01/07/2014   Procedure: HOLMIUM LASER APPLICATION;  Surgeon: Crist Fat, MD;  Location: [facility];  Service: Urology;  Laterality: Left;   PACEMAKER IMPLANT N/A 07/15/2018   Procedure: PACEMAKER IMPLANT;  Surgeon: Marinus Maw, MD;  Location: [facility];  Service: Cardiovascular;  Laterality: N/A;   PANNICULECTOMY N/A 06/03/2022   Procedure: PANNICULECTOMY;  Surgeon: Glenna Fellows, MD;  Location: [facility];  Service: Plastics;  Laterality: N/A;   REVISION TOTAL KNEE ARTHROPLASTY  left 08-01-2003/  right 02-20-02006   TOTAL KNEE ARTHROPLASTY  left 03-30-2003/  right 11-05-2004   post op left knee I & D arthroscopic lavage 04-20-2003   TRANSTHORACIC ECHOCARDIOGRAM  08-28-2011  dr Gunnar Fusi Kelsey [name]   moderate lvh/  ef 60-65%/  grade 2 diastolic dysfunction/ normal lv function / hyperdynamic lv with outflow murmur / turbulance through LVOT , mild SAM/ moderate lae/ moderate tr/  trivial mv & pv     Social History:  The patient  reports that she quit smoking about 11 months ago. Her smoking use included cigarettes. She has never used smokeless tobacco. She reports current alcohol use of about 3.0 standard drinks of alcohol per week. She reports that she does not use drugs.   Family History:  The patient's family history includes Hypertension in her brother and father.    ROS:  Please see the history of present illness. All other systems are reviewed and  Negative to the above problem except as noted.    PHYSICAL EXAM: VS:  BP 104/82   Pulse 64   Ht 5\' 4"  (1.626 m)   Wt 178 lb (80.7 kg)   BMI 30.55 kg/m   GEN: Well nourished, well developed, in no acute distress  HEENT: normal  Neck: no JVD, carotid bruits, Cardiac: RRR;   GrIII/VI systolic murmur  LSB to base   No LE edema   Respiratory:  clear to auscultation bilaterally, normal work of breathing GI: soft,  nontender, nondistended, + BS  No hepatomegaly  MS: no deformity Moving all extremities   Skin: warm and dry, no rash Neuro:  Strength and sensation are intact Psych: euthymic mood, full affect   EKG:  EKG is not ordered today.   'Echo  12/2021   1. Left ventricular ejection fraction, by estimation, is 60 to 65%. The  left ventricle has normal function. The left ventricle has no regional  wall motion abnormalities. There is moderate asymmetric left ventricular  hypertrophy of the basal-septal  segment. Left ventricular diastolic parameters are consistent with  Grade I  diastolic dysfunction (impaired relaxation). The average left ventricular  global longitudinal strain is -19.2 %. The global longitudinal strain is  normal.   2. Right ventricular systolic function is normal. The right ventricular  size is normal. There is mildly elevated pulmonary artery systolic  pressure. The estimated right ventricular systolic pressure is 40.5 mmHg.   3. Left atrial size was mildly dilated.   4. The mitral valve is grossly normal. Mild mitral valve regurgitation.   5. Tricuspid valve regurgitation is moderate.   6. The aortic valve is tricuspid. There is moderate calcification of the  aortic valve. There is moderate thickening of the aortic valve. Aortic  valve regurgitation is mild to moderate. Moderate aortic valve stenosis.  Aortic valve area, by VTI measures  1.11 cm. Aortic valve mean gradient measures 23.0 mmHg. Aortic valve Vmax  measures 3.05 m/s.   7. The inferior vena cava is normal in size with greater than 50%  respiratory variability, suggesting right atrial pressure of 3 mmHg.   Comparison(s): Compared to prior TTE 12/2020, there is no significant  change. Prior mean AoV gradient which is stable on current study.  AI now appears mild-to-moderate (previously mild).  Lipid Panel    Component Value Date/Time   CHOL 168 05/31/2018 0652   CHOL 140 07/24/2017 1307   TRIG 69  05/31/2018 0652   HDL 73 05/31/2018 0652   HDL 63 07/24/2017 1307   CHOLHDL 2.3 05/31/2018 0652   VLDL 14 05/31/2018 0652   LDLCALC 81 05/31/2018 0652   LDLCALC 62 07/24/2017 1307   LDLDIRECT 141.4 04/12/2013 1030      Wt Readings from Last 3 Encounters:  01/31/23 178 lb (80.7 kg)  07/19/22 177 lb (80.3 kg)  06/19/22 167 lb (75.8 kg)      ASSESSMENT AND PLAN:  1  Hx dyspnea,   Check CBC, BMET ESR (has been elevated)  2   AS/AI  Repeat echo in June   3  s/p PPM   for CHB  Follows with EP  4  HL Lipomed     5  HTN  BP is excellent     6  Hx of dizziness    Admission in 2019   NO CVA    Denies dizziness  Plan for follow up in 6 months    Current medicines are reviewed at length with the patient today.  The patient does not have concerns regarding medicines.  Signed, Dietrich Pates, MD  01/31/2023 10:13 AM    [facility] [address] [place], [place], [place]  [address] Phone: [phone]; Fax: [phone]

## 2023-01-31 NOTE — Patient Instructions (Signed)
Medication Instructions:  DECREASE AMLODPIONE TO 5 MG A DAY DECREASE CHLORTHALIDONE TO 12.5 MG A DAY  *If you need a refill on your cardiac medications before your next appointment, please call your pharmacy*   Lab Work: CBC, BMET, ANEMIA PANEL TODAY  If you have labs (blood work) drawn today and your tests are completely normal, you will receive your results only by: MyChart Message (if you have MyChart) OR A paper copy in the mail If you have any lab test that is abnormal or we need to change your treatment, we will call you to review the results.   Testing/Procedures:    Follow-Up: At [facility], you and your health needs are our priority.  As part of our continuing mission to provide you with exceptional heart care, we have created designated Provider Care Teams.  These Care Teams include your primary Cardiologist (physician) and Advanced Practice Providers (APPs -  Physician Assistants and Nurse Practitioners) who all work together to provide you with the care you need, when you need it.  We recommend signing up for the patient portal called "MyChart".  Sign up information is provided on this After Visit Summary.  MyChart is used to connect with patients for Virtual Visits (Telemedicine).  Patients are able to view lab/test results, encounter notes, upcoming appointments, etc.  Non-urgent messages can be sent to your provider as well.   To learn more about what you can do with MyChart, go to [email or web address].

## 2023-02-01 LAB — IRON AND TIBC
Iron Saturation: 33 % (ref 15–55)
Iron: 107 ug/dL (ref 27–139)
Total Iron Binding Capacity: 329 ug/dL (ref 250–450)
UIBC: 222 ug/dL (ref 118–369)

## 2023-02-01 LAB — BASIC METABOLIC PANEL
BUN/Creatinine Ratio: 32 — ABNORMAL HIGH (ref 12–28)
BUN: 27 mg/dL (ref 8–27)
CO2: 26 mmol/L (ref 20–29)
Calcium: 10.2 mg/dL (ref 8.7–10.3)
Chloride: 105 mmol/L (ref 96–106)
Creatinine, Ser: 0.85 mg/dL (ref 0.57–1.00)
Glucose: 78 mg/dL (ref 70–99)
Potassium: 3.8 mmol/L (ref 3.5–5.2)
Sodium: 143 mmol/L (ref 134–144)
eGFR: 72 mL/min/{1.73_m2} (ref >59)

## 2023-02-01 LAB — VITAMIN B12: Vitamin B-12: 804 pg/mL (ref 232–1245)

## 2023-02-01 LAB — CBC
Hematocrit: 38.9 % (ref 34.0–46.6)
Hemoglobin: 12.7 g/dL (ref 11.1–15.9)
MCH: 28.9 pg (ref 26.6–33.0)
MCHC: 32.6 g/dL (ref 31.5–35.7)
MCV: 89 fL (ref 79–97)
Platelets: 240 10*3/uL (ref 150–450)
RBC: 4.39 x10E6/uL (ref 3.77–5.28)
RDW: 12.9 % (ref 11.7–15.4)
WBC: 4.8 10*3/uL (ref 3.4–10.8)

## 2023-02-01 LAB — FERRITIN: Ferritin: 216 ng/mL — ABNORMAL HIGH (ref 15–150)

## 2023-02-01 LAB — FOLATE: Folate: >20 ng/mL (ref >3.0)

## 2023-02-03 ENCOUNTER — Telehealth: Payer: Self-pay | Admitting: Internal Medicine

## 2023-02-03 ENCOUNTER — Other Ambulatory Visit: Payer: Self-pay

## 2023-02-03 MED ORDER — AMLODIPINE BESYLATE 5 MG PO TABS: 5.0000 mg | ORAL_TABLET | Freq: Every day | ORAL | 3 refills | Status: AC

## 2023-02-03 NOTE — Telephone Encounter (Signed)
Pt c/o medication issue:  1. Name of Medication: amLODipine (NORVASC) 5 MG tablet   chlorthalidone (HYGROTON) 25 MG tablet    2. How are you currently taking this medication (dosage and times per day)?   3. Are you having a reaction (difficulty breathing--STAT)? No  4. What is your medication issue? Pt states when she tries to cut the above medications in half to take them they crumble all to pieces. She would like to speak with the nurse about getting a different dosage sent in. Please advise

## 2023-02-03 NOTE — Telephone Encounter (Addendum)
RX for Amlodipine 5 mg sent in for the pt... will ask Dr Tenny Craw if pt can take Chlorthalidone 15 mg as opposed to cutting in half for 12.5 mg.   Pt says she is feeling better on the blower doses.. she has more energy and feels she is more "stable" on her feet. She is unsure what her BP is but will keep a log and let us know how she is doing.

## 2023-02-04 ENCOUNTER — Other Ambulatory Visit: Payer: Self-pay | Admitting: Internal Medicine

## 2023-02-04 MED ORDER — CHLORTHALIDONE 15 MG PO TABS: 15.0000 mg | ORAL_TABLET | Freq: Every day | ORAL | 3 refills | Status: AC

## 2023-02-04 NOTE — Telephone Encounter (Signed)
If they make a 15 mg then OK to take Keep track of BP

## 2023-02-04 NOTE — Telephone Encounter (Signed)
Pt advised and she will let us know how her BP readings are doing and she says she is feeling improved.

## 2023-04-10 ENCOUNTER — Ambulatory Visit (INDEPENDENT_AMBULATORY_CARE_PROVIDER_SITE_OTHER): Payer: Medicare HMO

## 2023-04-10 DIAGNOSIS — I495 Sick sinus syndrome: Secondary | ICD-10-CM | POA: Diagnosis not present

## 2023-04-10 LAB — CUP PACEART REMOTE DEVICE CHECK
Battery Remaining Longevity: 128 mo
Battery Voltage: 3.01 V
Brady Statistic AP VP Percent: 0.02 %
Brady Statistic AP VS Percent: 8.43 %
Brady Statistic AS VP Percent: 0.08 %
Brady Statistic AS VS Percent: 91.47 %
Brady Statistic RA Percent Paced: 8.47 %
Brady Statistic RV Percent Paced: 0.1 %
Date Time Interrogation Session: 20241024092033
Implantable Lead Connection Status: 753985
Implantable Lead Connection Status: 753985
Implantable Lead Implant Date: 20200129
Implantable Lead Implant Date: 20200129
Implantable Lead Location: 753859
Implantable Lead Location: 753860
Implantable Lead Model: 3830
Implantable Lead Model: 5076
Implantable Pulse Generator Implant Date: 20200129
Lead Channel Impedance Value: 323 Ohm
Lead Channel Impedance Value: 323 Ohm
Lead Channel Impedance Value: 361 Ohm
Lead Channel Impedance Value: 475 Ohm
Lead Channel Pacing Threshold Amplitude: 0.625 V
Lead Channel Pacing Threshold Amplitude: 0.75 V
Lead Channel Pacing Threshold Pulse Width: 0.4 ms
Lead Channel Pacing Threshold Pulse Width: 0.4 ms
Lead Channel Sensing Intrinsic Amplitude: 15.75 mV
Lead Channel Sensing Intrinsic Amplitude: 15.75 mV
Lead Channel Sensing Intrinsic Amplitude: 3.125 mV
Lead Channel Sensing Intrinsic Amplitude: 3.125 mV
Lead Channel Setting Pacing Amplitude: 1.5 V
Lead Channel Setting Pacing Amplitude: 2 V
Lead Channel Setting Pacing Pulse Width: 0.4 ms
Lead Channel Setting Sensing Sensitivity: 1.2 mV
Zone Setting Status: 755011
Zone Setting Status: 755011

## 2023-04-23 ENCOUNTER — Ambulatory Visit
Admission: EM | Admit: 2023-04-23 | Discharge: 2023-04-23 | Disposition: A | Discharge status: Home / Self Care | Payer: Medicare HMO

## 2023-04-23 NOTE — ED Triage Notes (Signed)
Called pt for room- she was not in lobby or outside. I called her cell phone and she said she had left, stating that she was under the impression we were not able to do xrays. I advised her we could do an xray if the provider deemed it necessary and she was welcome to come back if she wanted to be evaluated. Also informed her of the orthopedic Urgent Care in [place].

## 2023-04-29 NOTE — Progress Notes (Signed)
Remote pacemaker transmission.   

## 2023-04-30 ENCOUNTER — Telehealth: Payer: Self-pay | Admitting: *Deleted

## 2023-04-30 ENCOUNTER — Telehealth: Payer: Self-pay | Admitting: Internal Medicine

## 2023-04-30 ENCOUNTER — Telehealth: Payer: Self-pay

## 2023-04-30 NOTE — Telephone Encounter (Signed)
   Name: Kelsey [name]  DOB: 06-Apr-1946  MRN: [id number]  Primary Cardiologist: Dietrich Pates, MD   Preoperative team, please contact this patient and set up a phone call appointment for further preoperative risk assessment. Please obtain consent and complete medication review. Thank you for your help. Last seen by Dr. Tenny Craw  01/31/2023  I confirm that guidance regarding antiplatelet and oral anticoagulation therapy has been completed and, if necessary, noted below.  I also confirmed the patient resides in the state of [place]. As per [facility] Medical Board telemedicine laws, the patient must reside in the state in which the provider is licensed.   Joni Reining, NP 04/30/2023, 12:12 PM [facility]

## 2023-04-30 NOTE — Telephone Encounter (Signed)
Pt has been scheduled tele pre op appt 05/19/23. Med rec and consent are done.   Pt tells me when going over her medications that she os taking ASA 81 mg daily. I have added this back to med list.

## 2023-04-30 NOTE — Telephone Encounter (Signed)
Will await for clearance request to come thru on base electronic fax.

## 2023-04-30 NOTE — Telephone Encounter (Signed)
Paper Work Dropped Off: Surgical Clearance from [facility]  Date: 04/30/2023  Location of paper:  faxed to [phone] , placed in folder for faxed clearances

## 2023-04-30 NOTE — Telephone Encounter (Signed)
   Pre-operative Risk Assessment    Patient Name: Kelsey [name]  DOB: 1945-11-07 MRN: [id number]  [place] office visit:  01/31/2023 Upcoming appointment : N/A    Request for Surgical Clearance    Procedure:   L3 Kyphoplasty  Date of Surgery:  Clearance TBD                                 Surgeon:  Dareen Piano, MD Surgeon's Group or Practice Name:  [facility] Phone number:  [phone] Fax number:  [phone]   Type of Clearance Requested:   - Medical    Type of Anesthesia:  Not Indicated   Additional requests/questions:    Elyse Jarvis   04/30/2023, 10:50 AM

## 2023-04-30 NOTE — Telephone Encounter (Signed)
Pt has been scheduled tele pre op appt 05/19/23. Med rec and consent are done.    Patient Consent for Virtual Visit        Kelsey [name] has provided verbal consent on 04/30/2023 for a virtual visit (video or telephone).   CONSENT FOR VIRTUAL VISIT FOR:  Kelsey [name]  By participating in this virtual visit I agree to the following:  I hereby voluntarily request, consent and authorize [facility] and its employed or contracted physicians, physician assistants, nurse practitioners or other licensed health care professionals (the Practitioner), to provide me with telemedicine health care services (the "Services") as deemed necessary by the treating Practitioner. I acknowledge and consent to receive the Services by the Practitioner via telemedicine. I understand that the telemedicine visit will involve communicating with the Practitioner through live audiovisual communication technology and the disclosure of certain medical information by electronic transmission. I acknowledge that I have been given the opportunity to request an in-person assessment or other available alternative prior to the telemedicine visit and am voluntarily participating in the telemedicine visit.  I understand that I have the right to withhold or withdraw my consent to the use of telemedicine in the course of my care at any time, without affecting my right to future care or treatment, and that the Practitioner or I may terminate the telemedicine visit at any time. I understand that I have the right to inspect all information obtained and/or recorded in the course of the telemedicine visit and may receive copies of available information for a reasonable fee.  I understand that some of the potential risks of receiving the Services via telemedicine include:  Delay or interruption in medical evaluation due to technological equipment failure or disruption; Information transmitted may not be sufficient (e.g. poor  resolution of images) to allow for appropriate medical decision making by the Practitioner; and/or  In rare instances, security protocols could fail, causing a breach of personal health information.  Furthermore, I acknowledge that it is my responsibility to provide information about my medical history, conditions and care that is complete and accurate to the best of my ability. I acknowledge that Practitioner's advice, recommendations, and/or decision may be based on factors not within their control, such as incomplete or inaccurate data provided by me or distortions of diagnostic images or specimens that may result from electronic transmissions. I understand that the practice of medicine is not an exact science and that Practitioner makes no warranties or guarantees regarding treatment outcomes. I acknowledge that a copy of this consent can be made available to me via my patient portal [facility] MyChart), or I can request a printed copy by calling the office of [facility].    I understand that my insurance will be billed for this visit.   I have read or had this consent read to me. I understand the contents of this consent, which adequately explains the benefits and risks of the Services being provided via telemedicine.  I have been provided ample opportunity to ask questions regarding this consent and the Services and have had my questions answered to my satisfaction. I give my informed consent for the services to be provided through the use of telemedicine in my medical care

## 2023-05-19 ENCOUNTER — Ambulatory Visit: Payer: Medicare HMO | Attending: Nurse Practitioner | Admitting: Nurse Practitioner

## 2023-05-19 ENCOUNTER — Encounter: Payer: Self-pay | Admitting: Nurse Practitioner

## 2023-05-19 DIAGNOSIS — Z01810 Encounter for preprocedural cardiovascular examination: Secondary | ICD-10-CM

## 2023-05-19 NOTE — Progress Notes (Signed)
Virtual Visit via Telephone Note   Because of CHER KITTO co-morbid illnesses, she is at least at moderate risk for complications without adequate follow up.  This format is felt to be most appropriate for this patient at this time.  The patient did not have access to video technology/had technical difficulties with video requiring transitioning to audio format only (telephone).  All issues noted in this document were discussed and addressed.  No physical exam could be performed with this format.  Please refer to the patient's chart for her consent to telehealth for [facility].  Evaluation Performed:  Preoperative cardiovascular risk assessment _____________   Date:  05/19/2023   Patient ID:  Kelsey [name], Kelsey [name] 18-Sep-1945, MRN [id number] Patient Location:  Home Provider location:   Office  Primary Care Provider:  Caffie Damme, MD Primary Cardiologist:  Dietrich Pates, MD  Chief Complaint / Patient Profile   77 y.o. y/o female with a h/o chest pain with negative Myoview 2019, mild to moderate AI, mild to moderate AS on echo 12/2022, hypertension, vigorous LV function, complete heart block s/p PPM implant 2019, former tobacco abuse, who is pending L3 kyphoplasty with Dr. Shon Baton, date TBD and presents today for telephonic preoperative cardiovascular risk assessment.  History of Present Illness    Kelsey [name] is a 77 y.o. female who presents via audio/video conferencing for a telehealth visit today.  Pt was last seen in cardiology clinic on 01/31/23 by Dr. Tenny Craw.  At that time Kelsey [name] was doing well.  The patient is now pending procedure as outlined above. Since her last visit, she denies chest pain, shortness of breath, lower extremity edema, fatigue, palpitations, melena, hematuria, hemoptysis, diaphoresis, weakness, presyncope, syncope, orthopnea, and PND. She    Past Medical History    Past Medical History:  Diagnosis Date   Aortic stenosis    Echo  7/22: EF 60-65, no RWMA, GRII DD, normal RVSF, RVSP 36.6, mild LAE, mild MR, mild-moderate TR, mild AI, moderate aortic stenosis (mean 24 mmHg, Vmax 349 cm/s, DI 0.45) // Echo 12/2021: EF 60-65, no RWMA, mod asymmetric LVH, GR 1 DD, GLS -19.2, normal RVSF, mild elevated PASP (40.5), mild LAE, mild MR, mod TR, mild to mod AI, mod AS (mean 23 mmHg, V-max 305 cm/s, DI 0.39)   Arthritis    OA   Heart murmur    refer to cardiologist note from dr Dietrich Pates   Hyperlipidemia    Hypertension    Left ureteral calculus    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   CHB   Wears dentures    upper  and partial lower   Wears glasses    Past Surgical History:  Procedure Laterality Date   ABDOMINAL HYSTERECTOMY     CARDIOVASCULAR STRESS TEST  03-31-2012   normal perfusion study/  no ischemia/  ef 69%   COLONOSCOPY WITH ESOPHAGOGASTRODUODENOSCOPY (EGD)  09-21-2003   CORNEAL TRANSPLANT Left 2002   CYSTOSCOPY WITH URETEROSCOPY AND STENT PLACEMENT Left 01/07/2014   Procedure: CYSTOSCOPY WITH LEFT RETRGRADE, URETEROSCOPY , AND LASER LITHOTRIPSY STONE EXTRACTION AND LEFT STENT PLACEMENT;  Surgeon: Crist Fat, MD;  Location: [facility];  Service: Urology;  Laterality: Left;   DEBRIDEMENT AND CLOSURE WOUND N/A 06/19/2022   Procedure: CLOSURE OF ABDOMINAL WOUND;  Surgeon: Glenna Fellows, MD;  Location: [facility];  Service: Plastics;  Laterality: N/A;   ESOPHAGOGASTRODUODENOSCOPY N/A 05/19/2020   Procedure: ESOPHAGOGASTRODUODENOSCOPY (EGD);  Surgeon: Vida Rigger, MD;  Location:  [facility];  Service: Endoscopy;  Laterality: N/A;   FOREIGN BODY REMOVAL Left 02/10/2020   Procedure: Removal of retained foreign body of left great toe;  Surgeon: Toni Arthurs, MD;  Location: [facility];  Service: Orthopedics;  Laterality: Left;    HOLMIUM LASER APPLICATION Left 01/07/2014   Procedure: HOLMIUM LASER APPLICATION;  Surgeon: Crist Fat, MD;  Location: [facility];  Service: Urology;  Laterality: Left;   PACEMAKER IMPLANT N/A 07/15/2018   Procedure: PACEMAKER IMPLANT;  Surgeon: Marinus Maw, MD;  Location: [facility];  Service: Cardiovascular;  Laterality: N/A;   PANNICULECTOMY N/A 06/03/2022   Procedure: PANNICULECTOMY;  Surgeon: Glenna Fellows, MD;  Location: [facility];  Service: Plastics;  Laterality: N/A;   REVISION TOTAL KNEE ARTHROPLASTY  left 08-01-2003/  right 02-20-02006   TOTAL KNEE ARTHROPLASTY  left 03-30-2003/  right 11-05-2004   post op left knee I & D arthroscopic lavage 04-20-2003   TRANSTHORACIC ECHOCARDIOGRAM  08-28-2011  dr Gunnar Fusi ross   moderate lvh/  ef 60-65%/  grade 2 diastolic dysfunction/ normal lv function / hyperdynamic lv with outflow murmur / turbulance through LVOT , mild SAM/ moderate lae/ moderate tr/  trivial mv & pv    Allergies  Allergies  Allergen Reactions   Bee Venom Anaphylaxis    ALL BEE STINGS   Oxycodone-Acetaminophen Itching    Home Medications    Prior to Admission medications   Medication Sig Start Date End Date Taking? Authorizing Provider  amLODipine (NORVASC) 5 MG tablet Take 1 tablet (5 mg total) by mouth daily. 02/03/23   Pricilla Riffle, MD  aspirin EC 81 MG tablet Take 81 mg by mouth daily. Swallow whole.    [provider]  atorvastatin (LIPITOR) 40 MG tablet Take 40 mg by mouth every evening. 04/13/13   Pricilla Riffle, MD  calcitonin, salmon, (MIACALCIN/FORTICAL) 200 UNIT/ACT nasal spray Place 1 spray into alternate nostrils daily. 04/30/23   [provider]  Calcium Carb-Cholecalciferol (CALCIUM + D3 PO) Take 1 capsule by mouth daily.    [provider]  chlorthalidone (THALITONE) 15 MG tablet Take 1 tablet (15 mg total) by mouth daily. 02/04/23   Pricilla Riffle, MD  Cholecalciferol (VITAMIN D) 50 MCG (2000 UT) tablet Take 2,000 Units by mouth daily.    [provider]  EPINEPHrine 0.3 mg/0.3 mL IJ SOAJ injection Inject 0.3 mg into the  muscle as needed for anaphylaxis.    [provider]  ferrous sulfate 325 (65 FE) MG EC tablet Take 325 mg by mouth daily. 05/06/19   [provider]  HYDROcodone-acetaminophen (NORCO) 10-325 MG tablet Take 1 tablet by mouth 2 (two) times daily as needed for severe pain. 06/19/22   Glenna Fellows, MD  magnesium oxide (MAG-OX) 400 (240 Mg) MG tablet Take 400 mg by mouth daily.    [provider]  metoprolol succinate (TOPROL-XL) 25 MG 24 hr tablet Take 25 mg by mouth daily.    [provider]  Multiple Vitamins-Minerals (MULTIVITAMIN WITH MINERALS) tablet Take 1 tablet by mouth daily.    [provider]  pantoprazole (PROTONIX) 40 MG tablet Take 40 mg by mouth daily.    [provider]  potassium chloride SA (KLOR-CON M) 20 MEQ tablet Take 20 mEq by mouth daily.    [provider]  prednisoLONE acetate (PRED FORTE) 1 % ophthalmic suspension Place 1 drop into the left eye daily. 02/04/20   [provider]  timolol (TIMOPTIC) 0.5 % ophthalmic solution Place 1 drop into the left eye 2 (two) times daily. 01/24/20   [provider]  triamcinolone 0.1% oint-Eucerin equivalent cream 1:1 mixture Apply topically 2 (two) times daily. 08/01/22   Ellsworth Lennox, PA-C    Physical Exam    Vital Signs:  Kelsey [name] does not have vital signs available for review today.  Given telephonic nature of communication, physical exam is limited. AAOx3. NAD. Normal affect.  Speech and respirations are unlabored.  Accessory Clinical Findings    None  Assessment & Plan    1.  Preoperative Cardiovascular Risk Assessment: According to the Revised Cardiac Risk Index (RCRI), her Perioperative Risk of Major Cardiac Event is (%): 0.4. Her Functional Capacity in METs is: 7.04 according to the Duke Activity Status Index (DASI). The patient is doing well from a cardiac perspective. Therefore, based on ACC/AHA guidelines, the patient would be at  acceptable risk for the planned procedure without further cardiovascular testing.   The patient was advised that if she develops new symptoms prior to surgery to contact our office to arrange for a follow-up visit, and she verbalized understanding.  Per office protocol, she may hold aspiring for 5-7 days prior to procedure and should resume as soon as hemodynamically stable postoperatively.  A copy of this note will be routed to requesting surgeon.  Time:   Today, I have spent 10 minutes with the patient with telehealth technology discussing medical history, symptoms, and management plan.     Levi Aland, NP-C  05/19/2023, 10:01 AM [address] Office [phone] Fax [phone]

## 2023-06-18 ENCOUNTER — Other Ambulatory Visit: Payer: Self-pay

## 2023-06-18 ENCOUNTER — Emergency Department (HOSPITAL_BASED_OUTPATIENT_CLINIC_OR_DEPARTMENT_OTHER)
Admission: EM | Admit: 2023-06-18 | Discharge: 2023-06-18 | Disposition: A | Discharge status: Home / Self Care | Payer: Medicare Other | Attending: Emergency Medicine | Admitting: Emergency Medicine

## 2023-06-18 ENCOUNTER — Encounter (HOSPITAL_BASED_OUTPATIENT_CLINIC_OR_DEPARTMENT_OTHER): Payer: Self-pay

## 2023-06-18 DIAGNOSIS — Z7982 Long term (current) use of aspirin: Secondary | ICD-10-CM | POA: Diagnosis not present

## 2023-06-18 DIAGNOSIS — M545 Low back pain, unspecified: Secondary | ICD-10-CM | POA: Diagnosis present

## 2023-06-18 DIAGNOSIS — Z79899 Other long term (current) drug therapy: Secondary | ICD-10-CM | POA: Insufficient documentation

## 2023-06-18 DIAGNOSIS — Z8673 Personal history of transient ischemic attack (TIA), and cerebral infarction without residual deficits: Secondary | ICD-10-CM | POA: Insufficient documentation

## 2023-06-18 DIAGNOSIS — Y92410 Unspecified street and highway as the place of occurrence of the external cause: Secondary | ICD-10-CM | POA: Diagnosis not present

## 2023-06-18 DIAGNOSIS — I10 Essential (primary) hypertension: Secondary | ICD-10-CM | POA: Diagnosis not present

## 2023-06-18 MED ORDER — ACETAMINOPHEN 500 MG PO TABS: 500.0000 mg | ORAL_TABLET | Freq: Four times a day (QID) | ORAL | 0 refills | Status: AC | PRN

## 2023-06-18 MED ORDER — CYCLOBENZAPRINE HCL 10 MG PO TABS: 10.0000 mg | ORAL_TABLET | Freq: Two times a day (BID) | ORAL | 0 refills | Status: AC | PRN
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
## 2023-07-04 NOTE — Progress Notes (Deleted)
  Electrophysiology Office Note:   ID:  Kelsey [name], Kelsey [name] 03/11/1946, MRN [id number]  Primary Cardiologist: Dietrich Pates, MD Electrophysiologist: [name], MD  {Click to update primary MD,subspecialty MD or APP then REFRESH:1}    History of Present Illness:   Kelsey [name] is a 78 y.o. female with h/o SND s/p PPM, HTN, tobacco abuse, and atrial tachycardia seen today for routine electrophysiology followup.   Since last being seen in our clinic the patient reports doing ***.  she denies chest pain, palpitations, dyspnea, PND, orthopnea, nausea, vomiting, dizziness, syncope, edema, weight gain, or early satiety.   Review of systems complete and found to be negative unless listed in HPI.   EP Information / Studies Reviewed:    EKG is ordered today. Personal review as below.       PPM Interrogation-  reviewed in detail today,  See PACEART report.  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   Wt Readings from Last 3 Encounters:  06/18/23 175 lb (79.4 kg)  01/31/23 178 lb (80.7 kg)  07/19/22 177 lb (80.3 kg)     GEN: No acute distress  NECK: No JVD; No carotid bruits CARDIAC: {EPRHYTHM:28826}, no murmurs, rubs, gallops RESPIRATORY:  Clear to auscultation without rales, wheezing or rhonchi  ABDOMEN: Soft, non-tender, non-distended EXTREMITIES:  {EDEMA LEVEL:28147::"No"} edema; No deformity   ASSESSMENT AND PLAN:    SND s/p Medtronic PPM  Normal PPM function See Pace Art report No changes today  Atrial tachycardia Stable  HTN Stable on current regimen   Tobacoo Abuse ***  {Click here to Review PMH, Prob List, Meds, Allergies, SHx, FHx  :1}   Disposition:   Follow up with {EPPROVIDERS:28135} {EPFOLLOW UP:28173}  Signed, Graciella Freer, PA-C

## 2023-07-07 ENCOUNTER — Ambulatory Visit: Payer: Medicare Other | Admitting: Student

## 2023-07-07 DIAGNOSIS — R0600 Dyspnea, unspecified: Secondary | ICD-10-CM

## 2023-07-07 DIAGNOSIS — I495 Sick sinus syndrome: Secondary | ICD-10-CM

## 2023-07-07 DIAGNOSIS — I10 Essential (primary) hypertension: Secondary | ICD-10-CM

## 2023-07-10 ENCOUNTER — Ambulatory Visit (INDEPENDENT_AMBULATORY_CARE_PROVIDER_SITE_OTHER): Payer: Medicare Other

## 2023-07-10 DIAGNOSIS — I495 Sick sinus syndrome: Secondary | ICD-10-CM | POA: Diagnosis not present

## 2023-07-11 ENCOUNTER — Encounter: Payer: Self-pay | Admitting: Internal Medicine

## 2023-07-11 LAB — CUP PACEART REMOTE DEVICE CHECK
Battery Remaining Longevity: 125 mo
Battery Voltage: 3.01 V
Brady Statistic AP VP Percent: 0.1 %
Brady Statistic AP VS Percent: 17.3 %
Brady Statistic AS VP Percent: 0.15 %
Brady Statistic AS VS Percent: 82.46 %
Brady Statistic RA Percent Paced: 17.42 %
Brady Statistic RV Percent Paced: 0.25 %
Date Time Interrogation Session: 20250124132238
Implantable Lead Connection Status: 753985
Implantable Lead Connection Status: 753985
Implantable Lead Implant Date: 20200129
Implantable Lead Implant Date: 20200129
Implantable Lead Location: 753859
Implantable Lead Location: 753860
Implantable Lead Model: 3830
Implantable Lead Model: 5076
Implantable Pulse Generator Implant Date: 20200129
Lead Channel Impedance Value: 304 Ohm
Lead Channel Impedance Value: 304 Ohm
Lead Channel Impedance Value: 342 Ohm
Lead Channel Impedance Value: 475 Ohm
Lead Channel Pacing Threshold Amplitude: 0.5 V
Lead Channel Pacing Threshold Amplitude: 0.75 V
Lead Channel Pacing Threshold Pulse Width: 0.4 ms
Lead Channel Pacing Threshold Pulse Width: 0.4 ms
Lead Channel Sensing Intrinsic Amplitude: 1.5 mV
Lead Channel Sensing Intrinsic Amplitude: 1.5 mV
Lead Channel Sensing Intrinsic Amplitude: 15.875 mV
Lead Channel Sensing Intrinsic Amplitude: 15.875 mV
Lead Channel Setting Pacing Amplitude: 1.5 V
Lead Channel Setting Pacing Amplitude: 2 V
Lead Channel Setting Pacing Pulse Width: 0.4 ms
Lead Channel Setting Sensing Sensitivity: 1.2 mV
Zone Setting Status: 755011
Zone Setting Status: 755011

## 2023-07-11 NOTE — Progress Notes (Unsigned)
  Electrophysiology Office Note:   ID:  Fawn, Desrocher December 09, 1945, MRN [id number]  Primary Cardiologist: Dietrich Pates, MD Electrophysiologist: [name], MD  {Click to update primary MD,subspecialty MD or APP then REFRESH:1}    History of Present Illness:   Kelsey [name] is a 78 y.o. female with h/o SND s/p PPM, HTN, tobacco abuse, and atrial tachycardia seen today for routine electrophysiology followup.   Since last being seen in our clinic the patient reports doing ***.  she denies chest pain, palpitations, dyspnea, PND, orthopnea, nausea, vomiting, dizziness, syncope, edema, weight gain, or early satiety.   Review of systems complete and found to be negative unless listed in HPI.   EP Information / Studies Reviewed:    EKG is ordered today. Personal review as below.      PPM Interrogation-  reviewed in detail today,  See PACEART report.  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   Wt Readings from Last 3 Encounters:  06/18/23 175 lb (79.4 kg)  01/31/23 178 lb (80.7 kg)  07/19/22 177 lb (80.3 kg)     GEN: No acute distress  NECK: No JVD; No carotid bruits CARDIAC: {EPRHYTHM:28826}, no murmurs, rubs, gallops RESPIRATORY:  Clear to auscultation without rales, wheezing or rhonchi  ABDOMEN: Soft, non-tender, non-distended EXTREMITIES:  {EDEMA LEVEL:28147::"No"} edema; No deformity   ASSESSMENT AND PLAN:    SND s/p Medtronic PPM  Normal PPM function See Pace Art report No changes today  Atrial tachycardia Stable  HTN Stable on current regimen   Tobacoo Abuse ***  {Click here to Review PMH, Prob List, Meds, Allergies, SHx, FHx  :1}   Disposition:   Follow up with {EPPROVIDERS:28135} {EPFOLLOW UP:28173}  Signed, Graciella Freer, PA-C

## 2023-07-14 ENCOUNTER — Encounter: Payer: Self-pay | Admitting: Student

## 2023-07-14 ENCOUNTER — Ambulatory Visit: Payer: Medicare Other | Attending: Student | Admitting: Student

## 2023-07-14 VITALS — BP 151/83 | HR 60 | Ht 64.0 in | Wt 181.6 lb

## 2023-07-14 DIAGNOSIS — I495 Sick sinus syndrome: Secondary | ICD-10-CM | POA: Diagnosis not present

## 2023-07-14 DIAGNOSIS — Z950 Presence of cardiac pacemaker: Secondary | ICD-10-CM

## 2023-07-14 DIAGNOSIS — I10 Essential (primary) hypertension: Secondary | ICD-10-CM

## 2023-07-14 DIAGNOSIS — R0600 Dyspnea, unspecified: Secondary | ICD-10-CM

## 2023-07-14 LAB — CUP PACEART INCLINIC DEVICE CHECK
Battery Remaining Longevity: 125 mo
Battery Voltage: 3.01 V
Brady Statistic AP VP Percent: 0.09 %
Brady Statistic AP VS Percent: 12.74 %
Brady Statistic AS VP Percent: 0.28 %
Brady Statistic AS VS Percent: 86.89 %
Brady Statistic RA Percent Paced: 12.85 %
Brady Statistic RV Percent Paced: 0.37 %
Date Time Interrogation Session: 20250127090240
Implantable Lead Connection Status: 753985
Implantable Lead Connection Status: 753985
Implantable Lead Implant Date: 20200129
Implantable Lead Implant Date: 20200129
Implantable Lead Location: 753859
Implantable Lead Location: 753860
Implantable Lead Model: 3830
Implantable Lead Model: 5076
Implantable Pulse Generator Implant Date: 20200129
Lead Channel Impedance Value: 304 Ohm
Lead Channel Impedance Value: 323 Ohm
Lead Channel Impedance Value: 380 Ohm
Lead Channel Impedance Value: 475 Ohm
Lead Channel Pacing Threshold Amplitude: 0.5 V
Lead Channel Pacing Threshold Amplitude: 0.75 V
Lead Channel Pacing Threshold Pulse Width: 0.4 ms
Lead Channel Pacing Threshold Pulse Width: 0.4 ms
Lead Channel Sensing Intrinsic Amplitude: 1.75 mV
Lead Channel Sensing Intrinsic Amplitude: 17.625 mV
Lead Channel Sensing Intrinsic Amplitude: 18.875 mV
Lead Channel Sensing Intrinsic Amplitude: 2 mV
Lead Channel Setting Pacing Amplitude: 1.5 V
Lead Channel Setting Pacing Amplitude: 2 V
Lead Channel Setting Pacing Pulse Width: 0.4 ms
Lead Channel Setting Sensing Sensitivity: 1.2 mV
Zone Setting Status: 755011
Zone Setting Status: 755011

## 2023-07-14 NOTE — Patient Instructions (Signed)
Medication Instructions:  Your physician recommends that you continue on your current medications as directed. Please refer to the Current Medication list given to you today.  *If you need a refill on your cardiac medications before your next appointment, please call your pharmacy*  Lab Work: None ordered If you have labs (blood work) drawn today and your tests are completely normal, you will receive your results only by: MyChart Message (if you have MyChart) OR A paper copy in the mail If you have any lab test that is abnormal or we need to change your treatment, we will call you to review the results.  Follow-Up: At [facility], you and your health needs are our priority.  As part of our continuing mission to provide you with exceptional heart care, we have created designated Provider Care Teams.  These Care Teams include your primary Cardiologist (physician) and Advanced Practice Providers (APPs -  Physician Assistants and Nurse Practitioners) who all work together to provide you with the care you need, when you need it.  Your next appointment:   1 year(s)  Provider:   Casimiro Needle "Otilio Saber, PA-C

## 2023-08-20 NOTE — Progress Notes (Signed)
 Remote pacemaker transmission.

## 2023-08-20 NOTE — Addendum Note (Signed)
 Addended by: Elease Etienne A on: 08/20/2023 12:31 PM   Modules accepted: Orders

## 2023-09-11 ENCOUNTER — Other Ambulatory Visit: Payer: Self-pay | Admitting: Physician Assistant

## 2023-09-11 DIAGNOSIS — M5430 Sciatica, unspecified side: Secondary | ICD-10-CM

## 2023-09-11 DIAGNOSIS — M545 Low back pain, unspecified: Secondary | ICD-10-CM

## 2023-09-17 NOTE — Discharge Instructions (Signed)

## 2023-09-18 ENCOUNTER — Ambulatory Visit
Admission: RE | Admit: 2023-09-18 | Discharge: 2023-09-18 | Disposition: A | Discharge status: Home / Self Care | Source: Ambulatory Visit | Attending: Physician Assistant | Admitting: Physician Assistant

## 2023-09-18 DIAGNOSIS — M545 Low back pain, unspecified: Secondary | ICD-10-CM

## 2023-09-18 DIAGNOSIS — M5430 Sciatica, unspecified side: Secondary | ICD-10-CM

## 2023-09-18 MED ORDER — MEPERIDINE HCL 50 MG/ML IJ SOLN: 50.0000 mg | Freq: Once | INTRAMUSCULAR | Status: DC | PRN

## 2023-09-18 MED ORDER — ONDANSETRON HCL 4 MG/2ML IJ SOLN: 4.0000 mg | Freq: Once | INTRAMUSCULAR | Status: DC | PRN

## 2023-09-18 MED ORDER — DIAZEPAM 5 MG PO TABS
5.0000 mg | ORAL_TABLET | Freq: Once | ORAL | Status: AC
  Administered 2023-09-18: 5 mg via ORAL

## 2023-09-18 MED ORDER — IOPAMIDOL (ISOVUE-M 200) INJECTION 41%
20.0000 mL | Freq: Once | INTRAMUSCULAR | Status: AC
  Administered 2023-09-18: 20 mL via INTRATHECAL

## 2023-10-01 ENCOUNTER — Encounter (INDEPENDENT_AMBULATORY_CARE_PROVIDER_SITE_OTHER): Admitting: Family Medicine

## 2023-10-01 ENCOUNTER — Encounter (INDEPENDENT_AMBULATORY_CARE_PROVIDER_SITE_OTHER): Payer: Self-pay

## 2023-10-09 ENCOUNTER — Ambulatory Visit (INDEPENDENT_AMBULATORY_CARE_PROVIDER_SITE_OTHER): Payer: Self-pay

## 2023-10-09 DIAGNOSIS — I495 Sick sinus syndrome: Secondary | ICD-10-CM

## 2023-10-09 LAB — CUP PACEART REMOTE DEVICE CHECK
Battery Remaining Longevity: 123 mo
Battery Voltage: 3 V
Brady Statistic AP VP Percent: 0.09 %
Brady Statistic AP VS Percent: 27.66 %
Brady Statistic AS VP Percent: 0.05 %
Brady Statistic AS VS Percent: 72.2 %
Brady Statistic RA Percent Paced: 27.77 %
Brady Statistic RV Percent Paced: 0.14 %
Date Time Interrogation Session: 20250423195445
Implantable Lead Connection Status: 753985
Implantable Lead Connection Status: 753985
Implantable Lead Implant Date: 20200129
Implantable Lead Implant Date: 20200129
Implantable Lead Location: 753859
Implantable Lead Location: 753860
Implantable Lead Model: 3830
Implantable Lead Model: 5076
Implantable Pulse Generator Implant Date: 20200129
Lead Channel Impedance Value: 323 Ohm
Lead Channel Impedance Value: 342 Ohm
Lead Channel Impedance Value: 380 Ohm
Lead Channel Impedance Value: 494 Ohm
Lead Channel Pacing Threshold Amplitude: 0.5 V
Lead Channel Pacing Threshold Amplitude: 0.625 V
Lead Channel Pacing Threshold Pulse Width: 0.4 ms
Lead Channel Pacing Threshold Pulse Width: 0.4 ms
Lead Channel Sensing Intrinsic Amplitude: 17.25 mV
Lead Channel Sensing Intrinsic Amplitude: 17.25 mV
Lead Channel Sensing Intrinsic Amplitude: 2.5 mV
Lead Channel Sensing Intrinsic Amplitude: 2.5 mV
Lead Channel Setting Pacing Amplitude: 1.5 V
Lead Channel Setting Pacing Amplitude: 2 V
Lead Channel Setting Pacing Pulse Width: 0.4 ms
Lead Channel Setting Sensing Sensitivity: 1.2 mV
Zone Setting Status: 755011
Zone Setting Status: 755011

## 2023-10-14 ENCOUNTER — Encounter: Payer: Self-pay | Admitting: Internal Medicine

## 2023-10-15 ENCOUNTER — Encounter (HOSPITAL_COMMUNITY): Payer: Self-pay | Admitting: Emergency Medicine

## 2023-10-15 ENCOUNTER — Emergency Department (HOSPITAL_COMMUNITY)
Admission: EM | Admit: 2023-10-15 | Discharge: 2023-10-15 | Disposition: A | Discharge status: Home / Self Care | Attending: Emergency Medicine | Admitting: Emergency Medicine

## 2023-10-15 ENCOUNTER — Other Ambulatory Visit: Payer: Self-pay

## 2023-10-15 ENCOUNTER — Emergency Department (HOSPITAL_COMMUNITY)

## 2023-10-15 DIAGNOSIS — S01511A Laceration without foreign body of lip, initial encounter: Secondary | ICD-10-CM | POA: Insufficient documentation

## 2023-10-15 DIAGNOSIS — S42291A Other displaced fracture of upper end of right humerus, initial encounter for closed fracture: Secondary | ICD-10-CM

## 2023-10-15 DIAGNOSIS — W19XXXA Unspecified fall, initial encounter: Secondary | ICD-10-CM

## 2023-10-15 DIAGNOSIS — S42201A Unspecified fracture of upper end of right humerus, initial encounter for closed fracture: Secondary | ICD-10-CM | POA: Insufficient documentation

## 2023-10-15 DIAGNOSIS — I10 Essential (primary) hypertension: Secondary | ICD-10-CM | POA: Insufficient documentation

## 2023-10-15 DIAGNOSIS — Z8673 Personal history of transient ischemic attack (TIA), and cerebral infarction without residual deficits: Secondary | ICD-10-CM | POA: Diagnosis not present

## 2023-10-15 DIAGNOSIS — R519 Headache, unspecified: Secondary | ICD-10-CM | POA: Diagnosis present

## 2023-10-15 DIAGNOSIS — Z950 Presence of cardiac pacemaker: Secondary | ICD-10-CM | POA: Insufficient documentation

## 2023-10-15 LAB — CBG MONITORING, ED: Glucose-Capillary: 131 mg/dL — ABNORMAL HIGH (ref 70–99)

## 2023-10-15 LAB — CBC
HCT: 39.4 % (ref 36.0–46.0)
Hemoglobin: 12.5 g/dL (ref 12.0–15.0)
MCH: 28.7 pg (ref 26.0–34.0)
MCHC: 31.7 g/dL (ref 30.0–36.0)
MCV: 90.6 fL (ref 80.0–100.0)
Platelets: 231 10*3/uL (ref 150–400)
RBC: 4.35 MIL/uL (ref 3.87–5.11)
RDW: 13.8 % (ref 11.5–15.5)
WBC: 9.6 10*3/uL (ref 4.0–10.5)
nRBC: 0 % (ref 0.0–0.2)

## 2023-10-15 LAB — BASIC METABOLIC PANEL WITH GFR
Anion gap: 11 (ref 5–15)
BUN: 20 mg/dL (ref 8–23)
CO2: 21 mmol/L — ABNORMAL LOW (ref 22–32)
Calcium: 10.1 mg/dL (ref 8.9–10.3)
Chloride: 106 mmol/L (ref 98–111)
Creatinine, Ser: 0.78 mg/dL (ref 0.44–1.00)
GFR, Estimated: >60 mL/min (ref >60)
Glucose, Bld: 112 mg/dL — ABNORMAL HIGH (ref 70–99)
Potassium: 3.6 mmol/L (ref 3.5–5.1)
Sodium: 138 mmol/L (ref 135–145)

## 2023-10-15 MED ORDER — MORPHINE SULFATE (PF) 4 MG/ML IV SOLN
4.0000 mg | Freq: Once | INTRAVENOUS | Status: AC
  Administered 2023-10-15: 4 mg via INTRAVENOUS
  Filled 2023-10-15: qty 1

## 2023-10-15 MED ORDER — ONDANSETRON 4 MG PO TBDP
4.0000 mg | ORAL_TABLET | Freq: Once | ORAL | Status: AC
  Administered 2023-10-15: 4 mg via ORAL
  Filled 2023-10-15: qty 1

## 2023-10-15 MED ORDER — LIDOCAINE-EPINEPHRINE (PF) 2 %-1:200000 IJ SOLN
10.0000 mL | Freq: Once | INTRAMUSCULAR | Status: AC
  Administered 2023-10-15: 10 mL via INTRADERMAL
  Filled 2023-10-15: qty 20

## 2023-10-15 MED ORDER — ONDANSETRON HCL 4 MG PO TABS: 4.0000 mg | ORAL_TABLET | Freq: Three times a day (TID) | ORAL | 0 refills | Status: AC | PRN

## 2023-10-15 NOTE — ED Notes (Signed)
 Attempted to ambulate pt walked 2 steps felt nauseas after sitting back in bed pt  threw up blood
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
## 2023-10-15 NOTE — ED Provider Notes (Signed)
 [place] [facility]GENC[facility] DEPARTMENT AT [facility] Provider Note   CSN: [id number] Arrival date & time: 10/15/23  1510     History  No chief complaint on file.   Kelsey [name] is a 78 y.o. female.  The history is provided by the patient and medical records. No language interpreter was used.     78 year old female history of aortic stenosis, complete heart block has a pacemaker, hypertension, prior stroke brought here via EMS for evaluation of fall.  Patient reports prior to arrival she was in the process of getting out of her car when she tripped and her foot fell forward striking her face against the ground.  She denies any loss of consciousness but endorsed pain to her lips as well as pain to her right shoulder.  She will use his tries to use her shoulder to break the fall and now she cannot move her shoulder.  She denies any precipitating symptoms prior to the fall.  She does not endorse any pain in her chest or trouble breathing no severe headache or neck pain no abdominal pain and no pain to her lower extremities.  She denies any focal numbness or focal weakness.  She did receive some pain medication via EMS but report inadequate pain control.  She is not on any blood thinner medication and denies any recent sickness.  Home Medications Prior to Admission medications   Medication Sig Start Date End Date Taking? Authorizing Provider  acetaminophen  (TYLENOL ) 500 MG tablet Take 1 tablet (500 mg total) by mouth every 6 (six) hours as needed. 06/18/23   Debbra Fairy, PA-C  amLODipine  (NORVASC ) 5 MG tablet Take 1 tablet (5 mg total) by mouth daily. 02/03/23   Elmyra Haggard, MD  aspirin  EC 81 MG tablet Take 81 mg by mouth daily. Swallow whole.    [provider]  atorvastatin  (LIPITOR) 40 MG tablet Take 40 mg by mouth every evening. 04/13/13   Elmyra Haggard, MD  calcitonin, salmon, (MIACALCIN/FORTICAL) 200 UNIT/ACT nasal spray Place 1 spray into alternate nostrils  daily. Patient not taking: Reported on 07/14/2023 04/30/23   [provider]  Calcium  Carb-Cholecalciferol (CALCIUM  + D3 PO) Take 1 capsule by mouth daily. Patient not taking: Reported on 07/14/2023    [provider]  chlorthalidone  (THALITONE ) 15 MG tablet Take 1 tablet (15 mg total) by mouth daily. 02/04/23   Elmyra Haggard, MD  Cholecalciferol (VITAMIN D ) 50 MCG (2000 UT) tablet Take 2,000 Units by mouth daily.    [provider]  cyclobenzaprine  (FLEXERIL ) 10 MG tablet Take 1 tablet (10 mg total) by mouth 2 (two) times daily as needed for muscle spasms. 06/18/23   Debbra Fairy, PA-C  EPINEPHrine  0.3 mg/0.3 mL IJ SOAJ injection Inject 0.3 mg into the muscle as needed for anaphylaxis.    [provider]  ferrous sulfate  325 (65 FE) MG EC tablet Take 325 mg by mouth daily. 05/06/19   [provider]  HYDROcodone -acetaminophen  (NORCO) 10-325 MG tablet Take 1 tablet by mouth 2 (two) times daily as needed for severe pain. 06/19/22   Alger Infield, MD  magnesium  oxide (MAG-OX) 400 (240 Mg) MG tablet Take 400 mg by mouth daily.    [provider]  metoprolol  succinate (TOPROL -XL) 25 MG 24 hr tablet Take 25 mg by mouth daily.    [provider]  Multiple Vitamins-Minerals (MULTIVITAMIN WITH MINERALS) tablet Take 1 tablet by mouth daily.    [provider]  pantoprazole  (PROTONIX )  40 MG tablet Take 40 mg by mouth daily.    [provider]  potassium chloride  SA (KLOR-CON  M) 20 MEQ tablet Take 20 mEq by mouth daily.    [provider]  prednisoLONE  acetate (PRED FORTE ) 1 % ophthalmic suspension Place 1 drop into the left eye daily. 02/04/20   [provider]  timolol  (TIMOPTIC ) 0.5 % ophthalmic solution Place 1 drop into the left eye 2 (two) times daily. 01/24/20   [provider]  triamcinolone  0.1% oint-Eucerin equivalent cream 1:1 mixture Apply topically 2 (two) times daily. 08/01/22   Gretel Leaven, PA-C       Allergies    Bee venom and Oxycodone -acetaminophen     Review of Systems   Review of Systems  All other systems reviewed and are negative.   Physical Exam Updated Vital Signs BP (!) 142/79   Pulse 89   Temp 97.8 F (36.6 C) (Oral)   Resp 18   SpO2 97%  Physical Exam Vitals and nursing note reviewed.  Constitutional:      General: She is not in acute distress.    Appearance: She is well-developed.  HENT:     Head: Normocephalic.     Comments: No scalp tenderness    Mouth/Throat:     Comments: Upper lip: 3 cm horizontal laceration to the mucosal upper lip anteriorly not through and through.  Patient has partial dentures without any malocclusion. Eyes:     Extraocular Movements: Extraocular movements intact.     Conjunctiva/sclera: Conjunctivae normal.     Pupils: Pupils are equal, round, and reactive to light.  Cardiovascular:     Rate and Rhythm: Normal rate and regular rhythm.     Pulses: Normal pulses.     Heart sounds: Normal heart sounds.  Pulmonary:     Effort: Pulmonary effort is normal.  Abdominal:     Palpations: Abdomen is soft.     Tenderness: There is no abdominal tenderness.  Musculoskeletal:        General: Tenderness (Right shoulder: Tenderness to palpation of the deltoid without obvious deformity however decreased range of motion secondary to pain.) present.     Cervical back: Normal range of motion and neck supple.  Skin:    Findings: No rash.  Neurological:     Mental Status: She is alert. Mental status is at baseline.  Psychiatric:        Mood and Affect: Mood normal.     ED Results / Procedures / Treatments   Labs (all labs ordered are listed, but only abnormal results are displayed) Labs Reviewed  BASIC METABOLIC PANEL WITH GFR - Abnormal; Notable for the following components:      Result Value   CO2 21 (*)    Glucose, Bld 112 (*)    All other components within normal limits  CBG MONITORING, ED - Abnormal; Notable for the  following components:   Glucose-Capillary 131 (*)    All other components within normal limits  CBC  URINALYSIS, ROUTINE W REFLEX MICROSCOPIC    EKG None  Date: 10/15/2023  Rate: 71  Rhythm: normal sinus rhythm  QRS Axis: normal  Intervals: normal  ST/T Wave abnormalities: normal  Conduction Disutrbances: none  Narrative Interpretation:   Old EKG Reviewed: No significant changes noted    Radiology CT HEAD WO CONTRAST Result Date: 10/15/2023 CLINICAL DATA:  Blunt facial trauma. Tripped and fell getting out of car and hit face on ground. Upper lip laceration. EXAM: CT HEAD WITHOUT CONTRAST CT  MAXILLOFACIAL WITHOUT CONTRAST CT CERVICAL SPINE WITHOUT CONTRAST TECHNIQUE: Multidetector CT imaging of the head, cervical spine, and maxillofacial structures were performed using the standard protocol without intravenous contrast. Multiplanar CT image reconstructions of the cervical spine and maxillofacial structures were also generated. RADIATION DOSE REDUCTION: This exam was performed according to the departmental dose-optimization program which includes automated exposure control, adjustment of the mA and/or kV according to patient size and/or use of iterative reconstruction technique. COMPARISON:  MRI head 05/31/2018 and CT 05/31/2018 FINDINGS: CT HEAD FINDINGS Brain: No intracranial hemorrhage, mass effect, or evidence of acute infarct. No hydrocephalus. No extra-axial fluid collection. Age related cerebral atrophy and chronic small vessel ischemic disease. Vascular: No hyperdense vessel. Intracranial arterial calcification. Skull: No fracture or focal lesion. Other: None. CT MAXILLOFACIAL FINDINGS Osseous: No fracture or mandibular dislocation. No destructive process. Orbits: Negative. No traumatic or inflammatory finding. Sinuses: Clear. Soft tissues: Swelling about the upper lip. CT CERVICAL SPINE FINDINGS Alignment: No evidence of traumatic listhesis. Skull base and vertebrae: No acute fracture.  Soft tissues and spinal canal: No prevertebral fluid or swelling. No visible canal hematoma. Disc levels: Mild multilevel spondylosis. Moderate to advanced multilevel facet arthropathy. No severe spinal canal narrowing. Upper chest: No acute abnormality. Other: None. IMPRESSION: 1. No acute intracranial abnormality. 2. No acute facial bone fracture. Swelling about the upper lip. 3. No acute fracture in the cervical spine. Electronically Signed   By: Rozell Cornet M.D.   On: 10/15/2023 19:07   CT Maxillofacial Wo Contrast Result Date: 10/15/2023 CLINICAL DATA:  Blunt facial trauma. Tripped and fell getting out of car and hit face on ground. Upper lip laceration. EXAM: CT HEAD WITHOUT CONTRAST CT MAXILLOFACIAL WITHOUT CONTRAST CT CERVICAL SPINE WITHOUT CONTRAST TECHNIQUE: Multidetector CT imaging of the head, cervical spine, and maxillofacial structures were performed using the standard protocol without intravenous contrast. Multiplanar CT image reconstructions of the cervical spine and maxillofacial structures were also generated. RADIATION DOSE REDUCTION: This exam was performed according to the departmental dose-optimization program which includes automated exposure control, adjustment of the mA and/or kV according to patient size and/or use of iterative reconstruction technique. COMPARISON:  MRI head 05/31/2018 and CT 05/31/2018 FINDINGS: CT HEAD FINDINGS Brain: No intracranial hemorrhage, mass effect, or evidence of acute infarct. No hydrocephalus. No extra-axial fluid collection. Age related cerebral atrophy and chronic small vessel ischemic disease. Vascular: No hyperdense vessel. Intracranial arterial calcification. Skull: No fracture or focal lesion. Other: None. CT MAXILLOFACIAL FINDINGS Osseous: No fracture or mandibular dislocation. No destructive process. Orbits: Negative. No traumatic or inflammatory finding. Sinuses: Clear. Soft tissues: Swelling about the upper lip. CT CERVICAL SPINE FINDINGS  Alignment: No evidence of traumatic listhesis. Skull base and vertebrae: No acute fracture. Soft tissues and spinal canal: No prevertebral fluid or swelling. No visible canal hematoma. Disc levels: Mild multilevel spondylosis. Moderate to advanced multilevel facet arthropathy. No severe spinal canal narrowing. Upper chest: No acute abnormality. Other: None. IMPRESSION: 1. No acute intracranial abnormality. 2. No acute facial bone fracture. Swelling about the upper lip. 3. No acute fracture in the cervical spine. Electronically Signed   By: Rozell Cornet M.D.   On: 10/15/2023 19:07   CT Cervical Spine Wo Contrast Result Date: 10/15/2023 CLINICAL DATA:  Blunt facial trauma. Tripped and fell getting out of car and hit face on ground. Upper lip laceration. EXAM: CT HEAD WITHOUT CONTRAST CT MAXILLOFACIAL WITHOUT CONTRAST CT CERVICAL SPINE WITHOUT CONTRAST TECHNIQUE: Multidetector CT imaging of the head, cervical spine, and maxillofacial structures were  performed using the standard protocol without intravenous contrast. Multiplanar CT image reconstructions of the cervical spine and maxillofacial structures were also generated. RADIATION DOSE REDUCTION: This exam was performed according to the departmental dose-optimization program which includes automated exposure control, adjustment of the mA and/or kV according to patient size and/or use of iterative reconstruction technique. COMPARISON:  MRI head 05/31/2018 and CT 05/31/2018 FINDINGS: CT HEAD FINDINGS Brain: No intracranial hemorrhage, mass effect, or evidence of acute infarct. No hydrocephalus. No extra-axial fluid collection. Age related cerebral atrophy and chronic small vessel ischemic disease. Vascular: No hyperdense vessel. Intracranial arterial calcification. Skull: No fracture or focal lesion. Other: None. CT MAXILLOFACIAL FINDINGS Osseous: No fracture or mandibular dislocation. No destructive process. Orbits: Negative. No traumatic or inflammatory  finding. Sinuses: Clear. Soft tissues: Swelling about the upper lip. CT CERVICAL SPINE FINDINGS Alignment: No evidence of traumatic listhesis. Skull base and vertebrae: No acute fracture. Soft tissues and spinal canal: No prevertebral fluid or swelling. No visible canal hematoma. Disc levels: Mild multilevel spondylosis. Moderate to advanced multilevel facet arthropathy. No severe spinal canal narrowing. Upper chest: No acute abnormality. Other: None. IMPRESSION: 1. No acute intracranial abnormality. 2. No acute facial bone fracture. Swelling about the upper lip. 3. No acute fracture in the cervical spine. Electronically Signed   By: Rozell Cornet M.D.   On: 10/15/2023 19:07   DG Shoulder Right Result Date: 10/15/2023 CLINICAL DATA:  Status post fall. EXAM: RIGHT SHOULDER - 2+ VIEW COMPARISON:  None Available. FINDINGS: An acute, comminuted fracture deformity is seen involving the head and neck of the proximal right humerus. Approximately 1 shaft width medial displacement of the distal fracture site is seen. There is no evidence of dislocation. Degenerative changes are seen involving the right acromial clavicular joint and right glenohumeral articulation. Lateral soft tissue swelling is noted. IMPRESSION: Acute fracture of the proximal right humerus. Electronically Signed   By: Virgle Grime M.D.   On: 10/15/2023 16:20    Procedures .Laceration Repair  Date/Time: 10/15/2023 7:40 PM  Performed by: Debbra Fairy, PA-C Authorized by: Debbra Fairy, PA-C   Consent:    Consent obtained:  Verbal   Consent given by:  Patient   Risks, benefits, and alternatives were discussed: yes     Risks discussed:  Infection and poor wound healing   Alternatives discussed:  No treatment Universal protocol:    Procedure explained and questions answered to patient or proxy's satisfaction: yes     Relevant documents present and verified: yes     Patient identity confirmed:  Verbally with patient and arm  band Laceration details:    Location:  Lip   Lip location:  Upper interior lip   Length (cm):  3   Depth (mm):  3 Pre-procedure details:    Preparation:  Patient was prepped and draped in usual sterile fashion and imaging obtained to evaluate for foreign bodies Exploration:    Limited defect created (wound extended): no     Hemostasis achieved with:  Epinephrine    Imaging outcome: foreign body not noted     Wound exploration: wound explored through full range of motion and entire depth of wound visualized     Wound extent: no underlying fracture and no vascular damage     Contaminated: no   Treatment:    Area cleansed with:  Saline   Amount of cleaning:  Standard   Irrigation solution:  Sterile saline   Irrigation method:  Pressure wash   Debridement:  None   Undermining:  None   Scar revision: no   Skin repair:    Repair method:  Sutures   Suture size:  5-0   Suture material:  Plain gut   Suture technique:  Simple interrupted   Number of sutures:  5 Approximation:    Approximation:  Close   Vermilion border well-aligned: yes   Repair type:    Repair type:  Intermediate Post-procedure details:    Dressing:  Open (no dressing)   Procedure completion:  Tolerated well, no immediate complications     Medications Ordered in ED Medications  ondansetron  (ZOFRAN -ODT) disintegrating tablet 4 mg (has no administration in time range)  morphine  (PF) 4 MG/ML injection 4 mg (4 mg Intravenous Given 10/15/23 1634)  lidocaine -EPINEPHrine  (XYLOCAINE  W/EPI) 2 %-1:200000 (PF) injection 10 mL (10 mLs Intradermal Given by Other 10/15/23 1952)  morphine  (PF) 4 MG/ML injection 4 mg (4 mg Intravenous Given 10/15/23 1800)    ED Course/ Medical Decision Making/ A&P                                 Medical Decision Making Amount and/or Complexity of Data Reviewed Labs: ordered. Radiology: ordered.  Risk Prescription drug management.   BP (!) 142/79   Pulse 89   Temp 97.8 F (36.6 C)  (Oral)   Resp 18   SpO2 97%   71:26 PM  78 year old female history of aortic stenosis, complete heart block has a pacemaker, hypertension, prior stroke brought here via EMS for evaluation of fall.  Patient reports prior to arrival she was in the process of getting out of her car when she tripped and her foot fell forward striking her face against the ground.  She denies any loss of consciousness but endorsed pain to her lips as well as pain to her right shoulder.  She will use his tries to use her shoulder to break the fall and now she cannot move her shoulder.  She denies any precipitating symptoms prior to the fall.  She does not endorse any pain in her chest or trouble breathing no severe headache or neck pain no abdominal pain and no pain to her lower extremities.  She denies any focal numbness or focal weakness.  She did receive some pain medication via EMS but report inadequate pain control.  She is not on any blood thinner medication and denies any recent sickness.  Exam notable for laceration to anterior upper lip on the mucosal aspect.  It is not through and through and does not cross the vermilion border.  Patient also has tenderness to palpation of the right shoulder with decreased range of motion but no obvious deformity noted.  Right elbow and right wrist nontender.  No significant midline spine tenderness.  Patient is alert and oriented x 4 and answering question appropriately.  -Labs ordered, independently viewed and interpreted by me.  Labs remarkable for reassuring labs -The patient was maintained on a cardiac monitor.  I personally viewed and interpreted the cardiac monitored which showed an underlying rhythm of: NSR -Imaging independently viewed and interpreted by me and I agree with radiologist's interpretation.  Result remarkable for XRay R shoulder demonstrates acute comminuted fx of the proximal right humerus -This patient presents to the ED for concern of fall, this involves an  extensive number of treatment options, and is a complaint that carries with it a high risk of complications and morbidity.  The differential diagnosis includes fx, dislocation,  strain, sprain, contusion, intracranial injury, lac -Co morbidities that complicate the patient evaluation includes pace maker, htn, heart block -Treatment includes lac repair, pain medication -Reevaluation of the patient after these medicines showed that the patient improved -PCP office notes or outside notes reviewed -Discussion with specialist oncall ortho Dr. Charol Copas who recommend sling, non weightbearing and outpt f/u with Dr. Hiram Lukes in 7 days.  Care discussed with Dr. Leighton Punches.  -Escalation to admission/observation considered: patients feels much better, is comfortable with discharge, and will follow up with PCP -Prescription medication considered, patient comfortable with zofran  as pt has pain medication at home -Social Determinant of Health considered which includes hx of tobacco use         Final Clinical Impression(s) / ED Diagnoses Final diagnoses:  Fall, initial encounter  Lip laceration, initial encounter  Humeral head fracture, right, closed, initial encounter    Rx / DC Orders ED Discharge Orders          Ordered    ondansetron  (ZOFRAN ) 4 MG tablet  Every 8 hours PRN        10/15/23 2121              Debbra Fairy, PA-C 10/15/23 2121    Lind Repine, MD 10/19/23 1143

## 2023-10-15 NOTE — Progress Notes (Signed)
 Orthopedic Tech Progress Note Patient Details:  Kelsey [name] 03/12/1946 [id number]  Ortho Devices Type of Ortho Device: Shoulder immobilizer Ortho Device/Splint Location: right Ortho Device/Splint Interventions: Ordered, Application, Adjustment   Post Interventions Patient Tolerated: Well Instructions Provided: Adjustment of device, Care of device  Leodis Rainwater 10/15/2023, 5:24 PM

## 2023-10-15 NOTE — Discharge Instructions (Addendum)
 You have been evaluated for your symptoms.  You have suffered a broken right shoulder.  Please call and follow-up with orthopedist in 1 week.  Do not bear any weight and wear the sling for support.  Take Zofran  as needed for nausea, take your home pain medication but be aware it can cause drowsiness.  You have injured your upper lip.  It was repaired using absorbable sutures which will dissolve in 1 to 2 weeks.

## 2023-10-15 NOTE — ED Notes (Signed)
 Patient transported to CT

## 2023-10-15 NOTE — ED Provider Notes (Signed)
 I provided a substantive portion of the care of this patient.  I personally made/approved the management plan for this patient and take responsibility for the patient management.      78 year old female here after mechanical fall just prior to arrival.  Has evidence of right humerus fracture.  Head and cervical spine without evidence of acute traumatic injury.  Also has intraoral laceration that will require repair   Lind Repine, MD 10/15/23 1924

## 2023-10-21 ENCOUNTER — Telehealth: Payer: Self-pay | Admitting: *Deleted

## 2023-10-21 NOTE — Telephone Encounter (Signed)
 Pt has been scheduled tele preop appt 10/22/23 add on ok per Slater Duncan, NP. Med rec and consent are done.

## 2023-10-21 NOTE — Telephone Encounter (Signed)
   Pre-operative Risk Assessment    Patient Name: Kelsey [name]  DOB: 1945-07-22 MRN: [id number]   Date of last office visit: 07/14/23 Michaelle Adolphus, [facility] Date of next office visit: NONE   Request for Surgical Clearance    Procedure:   RIGHT REVERSE TOTAL SHOULDER ( PROXIMAL HUMERUS Fx)  Date of Surgery:  Clearance 10/27/23                                 Surgeon:  DR. Carter Clare Surgeon's Group or Practice Name:  [facility] Phone number:  [phone] KERRI MAZE Fax number:  [phone]   Type of Clearance Requested:   - Medical  - Pharmacy:  [facility]      Type of Anesthesia:   CHOICE   Additional requests/questions:    [name]   10/21/2023, 1:06 PM

## 2023-10-21 NOTE — Telephone Encounter (Signed)
 Pt has been scheduled tele preop appt 10/22/23 add on ok per Slater Duncan, NP. Med rec and consent are done.      Patient Consent for Virtual Visit        Kelsey [name] has provided verbal consent on 10/21/2023 for a virtual visit (video or telephone).   CONSENT FOR VIRTUAL VISIT FOR:  Kelsey [name]  By participating in this virtual visit I agree to the following:  I hereby voluntarily request, consent and authorize [facility] and its employed or contracted physicians, physician assistants, nurse practitioners or other licensed health care professionals (the Practitioner), to provide me with telemedicine health care services (the "Services") as deemed necessary by the treating Practitioner. I acknowledge and consent to receive the Services by the Practitioner via telemedicine. I understand that the telemedicine visit will involve communicating with the Practitioner through live audiovisual communication technology and the disclosure of certain medical information by electronic transmission. I acknowledge that I have been given the opportunity to request an in-person assessment or other available alternative prior to the telemedicine visit and am voluntarily participating in the telemedicine visit.  I understand that I have the right to withhold or withdraw my consent to the use of telemedicine in the course of my care at any time, without affecting my right to future care or treatment, and that the Practitioner or I may terminate the telemedicine visit at any time. I understand that I have the right to inspect all information obtained and/or recorded in the course of the telemedicine visit and may receive copies of available information for a reasonable fee.  I understand that some of the potential risks of receiving the Services via telemedicine include:  Delay or interruption in medical evaluation due to technological equipment failure or disruption; Information transmitted  may not be sufficient (e.g. poor resolution of images) to allow for appropriate medical decision making by the Practitioner; and/or  In rare instances, security protocols could fail, causing a breach of personal health information.  Furthermore, I acknowledge that it is my responsibility to provide information about my medical history, conditions and care that is complete and accurate to the best of my ability. I acknowledge that Practitioner's advice, recommendations, and/or decision may be based on factors not within their control, such as incomplete or inaccurate data provided by me or distortions of diagnostic images or specimens that may result from electronic transmissions. I understand that the practice of medicine is not an exact science and that Practitioner makes no warranties or guarantees regarding treatment outcomes. I acknowledge that a copy of this consent can be made available to me via my patient portal [facility] MyChart), or I can request a printed copy by calling the office of [facility].    I understand that my insurance will be billed for this visit.   I have read or had this consent read to me. I understand the contents of this consent, which adequately explains the benefits and risks of the Services being provided via telemedicine.  I have been provided ample opportunity to ask questions regarding this consent and the Services and have had my questions answered to my satisfaction. I give my informed consent for the services to be provided through the use of telemedicine in my medical care

## 2023-10-21 NOTE — Telephone Encounter (Signed)
 Primary Cardiologist:Paula Avanell Bob, MD   Preoperative team, please contact this patient and set up a phone call appointment for further preoperative risk assessment. Please obtain consent and complete medication review. Thank you for your help.   From cardiology perspective, aspirin  may be held 5-7 days preoperatively.   I also confirmed the patient resides in the state of [place] . As per [facility] Medical Board telemedicine laws, the patient must reside in the state in which the provider is licensed.   [name], NP-C  10/21/2023, 1:36 PM [address] [place], [place] [address] Office [phone] Fax [phone]

## 2023-10-22 ENCOUNTER — Encounter: Payer: Self-pay | Admitting: Nurse Practitioner

## 2023-10-22 ENCOUNTER — Encounter: Payer: Self-pay | Admitting: Internal Medicine

## 2023-10-22 ENCOUNTER — Ambulatory Visit: Attending: Cardiovascular Disease | Admitting: Nurse Practitioner

## 2023-10-22 DIAGNOSIS — Z01810 Encounter for preprocedural cardiovascular examination: Secondary | ICD-10-CM | POA: Diagnosis not present

## 2023-10-22 NOTE — Progress Notes (Signed)
 PERIOPERATIVE PRESCRIPTION FOR IMPLANTED CARDIAC DEVICE PROGRAMMING  Patient Information: Name:  Kelsey [name]  DOB:  11/17/1945  MRN:  [id number]  Planned Procedure:  Arthroplasty Shoulder. Total Reverse,right  Surgeon:  Dr. Carter Clare  Date of Procedure:  10/27/2023  Cautery will be used.  Position during surgery:    Device Information:  Clinic EP Physician:  Manya Sells, MD   Device Type:  Pacemaker Manufacturer and Phone #:  Medtronic: [id number] Pacemaker Dependent?:  No. Date of Last Device Check:  10/14/2023 Normal Device Function?:  Yes.    Electrophysiologist's Recommendations:  Have magnet available. Provide continuous ECG monitoring when magnet is used or reprogramming is to be performed.  Procedure may interfere with device function.  Magnet should be placed over device during procedure.  Per Device Clinic Standing Orders, Forrestine Ike, RN  3:55 PM 10/22/2023

## 2023-10-22 NOTE — Progress Notes (Signed)
 Virtual Visit via Telephone Note   Because of Kelsey [name] co-morbid illnesses, she is at least at moderate risk for complications without adequate follow up.  This format is felt to be most appropriate for this patient at this time.  Due to technical limitations with video connection Web designer), today's appointment will be conducted as an audio only telehealth visit, and KYANNA BASKETTE verbally agreed to proceed in this manner.   All issues noted in this document were discussed and addressed.  No physical exam could be performed with this format.  Evaluation Performed:  Preoperative cardiovascular risk assessment _____________   Date:  10/22/2023   Patient ID:  Kelsey, [name] [birth year], MRN [id number] Patient Location:  Home Provider location:   Office  Primary Care Provider:  Bertrum Brodie, MD Primary Cardiologist:  Ola Berger, MD  Chief Complaint / Patient Profile   78 y.o. y/o female with a h/o sinus node dysfunction s/p PPM implant, tobacco abuse, HTN, atrial tachycardia, mild MR and mild/mod AI on echo 12/2022 who is pending right reverse total shoulder (proximal humerus fracture) with Dr. Hiram Lukes on 10/27/23 and presents today for telephonic preoperative cardiovascular risk assessment.  History of Present Illness    Kelsey [name] is a 78 y.o. female who presents via audio/video conferencing for a telehealth visit today.  Pt was last seen in cardiology clinic on 07/14/23 by Michaelle Adolphus, PA.  At that time NICOLL [name] was doing well.  The patient is now pending procedure as outlined above. Since her last visit, she denies chest pain, shortness of breath, lower extremity edema, fatigue, palpitations, melena, hematuria, hemoptysis, diaphoresis, weakness, presyncope, syncope, orthopnea, and PND. She is able to achieve > 4 METS activity without concerning cardiac symptoms with regular house work and ADLs.   Past Medical History    Past Medical History:   Diagnosis Date   Aortic stenosis    Echo 7/22: EF 60-65, no RWMA, GRII DD, normal RVSF, RVSP 36.6, mild LAE, mild MR, mild-moderate TR, mild AI, moderate aortic stenosis (mean 24 mmHg, Vmax 349 cm/s, DI 0.45) // Echo 12/2021: EF 60-65, no RWMA, mod asymmetric LVH, GR 1 DD, GLS -19.2, normal RVSF, mild elevated PASP (40.5), mild LAE, mild MR, mod TR, mild to mod AI, mod AS (mean 23 mmHg, V-max 305 cm/s, DI 0.39)   Arthritis    OA   Heart murmur    refer to cardiologist note from dr Ola Berger   Hyperlipidemia    Hypertension    Left ureteral calculus    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   CHB   Wears dentures    upper  and partial lower   Wears glasses    Past Surgical History:  Procedure Laterality Date   ABDOMINAL HYSTERECTOMY     CARDIOVASCULAR STRESS TEST  03-31-2012   normal perfusion study/  no ischemia/  ef 69%   COLONOSCOPY WITH ESOPHAGOGASTRODUODENOSCOPY (EGD)  09-21-2003   CORNEAL TRANSPLANT Left 2002   CYSTOSCOPY WITH URETEROSCOPY AND STENT PLACEMENT Left 01/07/2014   Procedure: CYSTOSCOPY WITH LEFT RETRGRADE, URETEROSCOPY , AND LASER LITHOTRIPSY STONE EXTRACTION AND LEFT STENT PLACEMENT;  Surgeon: Andrez Banker, MD;  Location: [facility];  Service: Urology;  Laterality: Left;   DEBRIDEMENT AND CLOSURE WOUND N/A 06/19/2022   Procedure: CLOSURE OF ABDOMINAL WOUND;  Surgeon: Alger Infield, MD;  Location: [facility];  Service: Plastics;  Laterality: N/A;   ESOPHAGOGASTRODUODENOSCOPY N/A 05/19/2020   Procedure: ESOPHAGOGASTRODUODENOSCOPY (EGD);  Surgeon: Ozell Blunt, MD;  Location: [facility];  Service: Endoscopy;  Laterality: N/A;   FOREIGN BODY REMOVAL Left 02/10/2020   Procedure: Removal of retained foreign body of left great toe;  Surgeon: Amada Backer, MD;  Location: [facility];  Service: Orthopedics;  Laterality: Left;    HOLMIUM LASER APPLICATION Left 01/07/2014   Procedure: HOLMIUM LASER APPLICATION;  Surgeon:  Andrez Banker, MD;  Location: [facility];  Service: Urology;  Laterality: Left;   PACEMAKER IMPLANT N/A 07/15/2018   Procedure: PACEMAKER IMPLANT;  Surgeon: Tammie Fall, MD;  Location: [facility];  Service: Cardiovascular;  Laterality: N/A;   PANNICULECTOMY N/A 06/03/2022   Procedure: PANNICULECTOMY;  Surgeon: Alger Infield, MD;  Location: [facility];  Service: Plastics;  Laterality: N/A;   REVISION TOTAL KNEE ARTHROPLASTY  left 08-01-2003/  right 02-20-02006   TOTAL KNEE ARTHROPLASTY  left 03-30-2003/  right 11-05-2004   post op left knee I & D arthroscopic lavage 04-20-2003   TRANSTHORACIC ECHOCARDIOGRAM  08-28-2011  dr Maureen Sour ross   moderate lvh/  ef 60-65%/  grade 2 diastolic dysfunction/ normal lv function / hyperdynamic lv with outflow murmur / turbulance through LVOT , mild SAM/ moderate lae/ moderate tr/  trivial mv & pv    Allergies  Allergies  Allergen Reactions   Bee Venom Anaphylaxis    ALL BEE STINGS   Oxycodone -Acetaminophen  Itching    Home Medications    Prior to Admission medications   Medication Sig Start Date End Date Taking? Authorizing Provider  acetaminophen  (TYLENOL ) 500 MG tablet Take 1 tablet (500 mg total) by mouth every 6 (six) hours as needed. 06/18/23   Debbra Fairy, PA-C  amLODipine  (NORVASC ) 5 MG tablet Take 1 tablet (5 mg total) by mouth daily. 02/03/23   Elmyra Haggard, MD  aspirin  EC 81 MG tablet Take 81 mg by mouth daily. Swallow whole.    [provider]  atorvastatin  (LIPITOR) 40 MG tablet Take 40 mg by mouth every evening. 04/13/13   Elmyra Haggard, MD  brimonidine (ALPHAGAN) 0.2 % ophthalmic solution Place 1 drop into the left eye in the morning and at bedtime. 08/30/23   [provider]  calcitonin, salmon, (MIACALCIN/FORTICAL) 200 UNIT/ACT nasal spray Place 1 spray into alternate nostrils daily. 04/30/23   [provider]  chlorthalidone  (THALITONE ) 15 MG tablet Take 1 tablet (15 mg total) by  mouth daily. 02/04/23   Elmyra Haggard, MD  Cholecalciferol (VITAMIN D ) 50 MCG (2000 UT) tablet Take 2,000 Units by mouth daily.    [provider]  cyclobenzaprine  (FLEXERIL ) 10 MG tablet Take 1 tablet (10 mg total) by mouth 2 (two) times daily as needed for muscle spasms. Patient not taking: Reported on 10/21/2023 06/18/23   Debbra Fairy, PA-C  cyclobenzaprine  (FLEXERIL ) 5 MG tablet Take 5-10 mg by mouth at bedtime as needed for muscle spasms.    [provider]  dorzolamide-timolol  (COSOPT) 2-0.5 % ophthalmic solution Place 1 drop into the left eye 2 (two) times daily. 05/13/23   [provider]  EPINEPHrine  0.3 mg/0.3 mL IJ SOAJ injection Inject 0.3 mg into the muscle as needed for anaphylaxis.    [provider]  ferrous sulfate  325 (65 FE) MG EC tablet Take 325 mg by mouth daily. 05/06/19   [provider]  HYDROcodone -acetaminophen  (NORCO) 10-325 MG tablet Take 1 tablet by mouth 2 (two) times daily as needed for severe pain. 06/19/22   Alger Infield, MD  latanoprost (XALATAN) 0.005 % ophthalmic solution Place 1 drop into the left eye at bedtime. 10/19/23   [provider]  magnesium  oxide (MAG-OX) 400 (240 Mg) MG tablet Take 400 mg by mouth daily.    [provider]  metoprolol  succinate (TOPROL -XL) 25 MG 24 hr tablet Take 25 mg by mouth daily.    [provider]  Multiple Vitamins-Minerals (MULTIVITAMIN WITH MINERALS) tablet Take 1 tablet by mouth daily.    [provider]  ondansetron  (ZOFRAN ) 4 MG tablet Take 1 tablet (4 mg total) by mouth every 8 (eight) hours as needed. Patient not taking: Reported on 10/21/2023 10/15/23   Debbra Fairy, PA-C  pantoprazole  (PROTONIX ) 40 MG tablet Take 40 mg by mouth daily.    [provider]  polyethylene glycol (MIRALAX / GLYCOLAX) 17 g packet Take 17 g by mouth daily as needed for moderate constipation.    [provider]  potassium chloride  SA (KLOR-CON  M) 20 MEQ  tablet Take 20 mEq by mouth daily.    [provider]  prednisoLONE  acetate (PRED FORTE ) 1 % ophthalmic suspension Place 1 drop into the left eye daily. 02/04/20   [provider]  triamcinolone  0.1% oint-Eucerin equivalent cream 1:1 mixture Apply topically 2 (two) times daily. Patient not taking: Reported on 10/21/2023 08/01/22   Gretel Leaven, PA-C    Physical Exam    Vital Signs:  ARLYNN PUGLISI does not have vital signs available for review today.  Given telephonic nature of communication, physical exam is limited. AAOx3. NAD. Normal affect.  Speech and respirations are unlabored.  Accessory Clinical Findings    None  Assessment & Plan    1.  Preoperative Cardiovascular Risk Assessment: According to the Revised Cardiac Risk Index (RCRI), her Perioperative Risk of Major Cardiac Event is (%): 0.9. Her Functional Capacity in METs is: 5.38 according to the Duke Activity Status Index (DASI). The patient is doing well from a cardiac perspective. Therefore, based on ACC/AHA guidelines, the patient would be at acceptable risk for the planned procedure without further cardiovascular testing.   The patient was advised that if she develops new symptoms prior to surgery to contact our office to arrange for a follow-up visit, and she verbalized understanding.  From a cardiology perspective, aspirin  may be held 5-7 days preoperatively and should be resumed as soon as hemodynamically stable postoperatively.   A copy of this note will be routed to requesting surgeon.  Time:   Today, I have spent 10 minutes with the patient with telehealth technology discussing medical history, symptoms, and management plan.    [name], NP-C  10/22/2023, 3:35 PM [address], [address] [place], [place] [address] Office [phone] Fax [phone]

## 2023-10-22 NOTE — Patient Instructions (Signed)
 DUE TO COVID-19 ONLY TWO VISITORS  (aged 76 and older)  ARE ALLOWED TO COME WITH YOU AND STAY IN THE WAITING ROOM ONLY DURING PRE OP AND PROCEDURE.   **NO VISITORS ARE ALLOWED IN THE SHORT STAY AREA OR RECOVERY ROOM!!**  IF YOU WILL BE ADMITTED INTO THE HOSPITAL YOU ARE ALLOWED ONLY FOUR SUPPORT PEOPLE DURING VISITATION HOURS ONLY (7 AM -8PM)   The support person(s) must pass our screening, gel in and out, and wear a mask at all times, including in the patient's room. Patients must also wear a mask when staff or their support person are in the room. Visitors GUEST BADGE MUST BE WORN VISIBLY  One adult visitor may remain with you overnight and MUST be in the room by 8 P.M.     Your procedure is scheduled on: 10/27/23   Report to [facility] Main Entrance    Report to admitting at : 12:15 AM   Call this number if you have problems the morning of surgery [phone]   Do not eat food :After Midnight.   After Midnight you may have the following liquids until : 11:45 AM DAY OF SURGERY  Water  Black Coffee (sugar ok, NO MILK/CREAM OR CREAMERS)  Tea (sugar ok, NO MILK/CREAM OR CREAMERS) regular and decaf                             Plain Jell-O (NO RED)                                           Fruit ices (not with fruit pulp, NO RED)                                     Popsicles (NO RED)                                                                  Juice: apple, WHITE grape, WHITE cranberry Sports drinks like Gatorade (NO RED)   The day of surgery:  Drink ONE (1) Pre-Surgery Clear Ensure at : 11:45 AM the morning of surgery. Drink in one sitting. Do not sip.  This drink was given to you during your hospital  pre-op appointment visit. Nothing else to drink after completing the  Pre-Surgery Clear Ensure or G2.          If you have questions, please contact your surgeon's office.  FOLLOW AND ANY ADDITIONAL PRE OP INSTRUCTIONS YOU RECEIVED FROM YOUR SURGEON'S OFFICE!!!    Oral Hygiene is also important to reduce your risk of infection.                                    Remember - BRUSH YOUR TEETH THE MORNING OF SURGERY WITH YOUR REGULAR TOOTHPASTE  DENTURES WILL BE REMOVED PRIOR TO SURGERY PLEASE DO NOT APPLY "Poly grip" OR ADHESIVES!!!   Do NOT smoke after Midnight   Take these medicines the morning of  surgery with A SIP OF WATER : metoprolol ,amlodipine ,pantoprazole .Use eye drops as usual.Tylenol  as needed.  STOP TAKING all Vitamins, Herbs and supplements 1 week before your surgery.                               You may not have any metal on your body including hair pins, jewelry, and body piercing             Do not wear make-up, lotions, powders, perfumes/cologne, or deodorant  Do not wear nail polish including gel and S&S, artificial/acrylic nails, or any other type of covering on natural nails including finger and toenails. If you have artificial nails, gel coating, etc. that needs to be removed by a nail salon please have this removed prior to surgery or surgery may need to be canceled/ delayed if the surgeon/ anesthesia feels like they are unable to be safely monitored.   Do not shave  48 hours prior to surgery.    Do not bring valuables to the hospital. [facility] IS NOT             RESPONSIBLE   FOR VALUABLES.   Contacts, glasses, or bridgework may not be worn into surgery.   Bring small overnight bag day of surgery.   DO NOT BRING YOUR HOME MEDICATIONS TO THE HOSPITAL. PHARMACY WILL DISPENSE MEDICATIONS LISTED ON YOUR MEDICATION LIST TO YOU DURING YOUR ADMISSION IN THE HOSPITAL!    Patients discharged on the day of surgery will not be allowed to drive home.  Someone NEEDS to stay with you for the first 24 hours after anesthesia.   Special Instructions: Bring a copy of your healthcare power of attorney and living will documents         the day of surgery if you haven't scanned them before.              Please read over the following fact  sheets you were given: IF YOU HAVE QUESTIONS ABOUT YOUR PRE-OP INSTRUCTIONS PLEASE CALL [phone]    - Preparing for Total Shoulder Arthroplasty    Before surgery, you can play an important role. Because skin is not sterile, your skin needs to be as free of germs as possible. You can reduce the number of germs on your skin by using the following products. Benzoyl Peroxide Gel Reduces the number of germs present on the skin Applied twice a day to shoulder area starting two days before surgery    ==================================================================  Please follow these instructions carefully:  BENZOYL PEROXIDE 5% GEL  Please do not use if you have an allergy to benzoyl peroxide.   If your skin becomes reddened/irritated stop using the benzoyl peroxide.  Starting two days before surgery, apply as follows: Apply benzoyl peroxide in the morning and at night. Apply after taking a shower. If you are not taking a shower clean entire shoulder front, back, and side along with the armpit with a clean wet washcloth.  Place a quarter-sized dollop on your shoulder and rub in thoroughly, making sure to cover the front, back, and side of your shoulder, along with the armpit.   2 days before ____ AM   ____ PM              1 day before ____ AM   ____ PM  Do this twice a day for two days.  (Last application is the night before surgery, AFTER using the CHG soap as described below).  Do NOT apply benzoyl peroxide gel on the day of surgery.  Pre-operative 5 CHG Bath Instructions   You can play a key role in reducing the risk of infection after surgery. Your skin needs to be as free of germs as possible. You can reduce the number of germs on your skin by washing with CHG (chlorhexidine  gluconate) soap before surgery. CHG is an antiseptic soap that kills germs and continues to kill germs even after washing.   DO NOT use if you have an allergy to  chlorhexidine /CHG or antibacterial soaps. If your skin becomes reddened or irritated, stop using the CHG and notify one of our RNs at [phone].   Please shower with the CHG soap starting 4 days before surgery using the following schedule:     Please keep in mind the following:  DO NOT shave, including legs and underarms, starting the day of your first shower.   You may shave your face at any point before/day of surgery.  Place clean sheets on your bed the day you start using CHG soap. Use a clean washcloth (not used since being washed) for each shower. DO NOT sleep with pets once you start using the CHG.   CHG Shower Instructions:  If you choose to wash your hair and private area, wash first with your normal shampoo/soap.  After you use shampoo/soap, rinse your hair and body thoroughly to remove shampoo/soap residue.  Turn the water  OFF and apply about 3 tablespoons (45 ml) of CHG soap to a CLEAN washcloth.  Apply CHG soap ONLY FROM YOUR NECK DOWN TO YOUR TOES (washing for 3-5 minutes)  DO NOT use CHG soap on face, private areas, open wounds, or sores.  Pay special attention to the area where your surgery is being performed.  If you are having back surgery, having someone wash your back for you may be helpful. Wait 2 minutes after CHG soap is applied, then you may rinse off the CHG soap.  Pat dry with a clean towel  Put on clean clothes/pajamas   If you choose to wear lotion, please use ONLY the CHG-compatible lotions on the back of this paper.     Additional instructions for the day of surgery: DO NOT APPLY any lotions, deodorants, cologne, or perfumes.   Put on clean/comfortable clothes.  Brush your teeth.  Ask your nurse before applying any prescription medications to the skin.   CHG Compatible Lotions   Aveeno Moisturizing lotion  Cetaphil Moisturizing Cream  Cetaphil Moisturizing Lotion  Clairol Herbal Essence Moisturizing Lotion, Dry Skin  Clairol Herbal Essence  Moisturizing Lotion, Extra Dry Skin  Clairol Herbal Essence Moisturizing Lotion, Normal Skin  Curel Age Defying Therapeutic Moisturizing Lotion with Alpha Hydroxy  Curel Extreme Care Body Lotion  Curel Soothing Hands Moisturizing Hand Lotion  Curel Therapeutic Moisturizing Cream, Fragrance-Free  Curel Therapeutic Moisturizing Lotion, Fragrance-Free  Curel Therapeutic Moisturizing Lotion, Original Formula  Eucerin Daily Replenishing Lotion  Eucerin Dry Skin Therapy Plus Alpha Hydroxy Crme  Eucerin Dry Skin Therapy Plus Alpha Hydroxy Lotion  Eucerin Original Crme  Eucerin Original Lotion  Eucerin Plus Crme Eucerin Plus Lotion  Eucerin TriLipid Replenishing Lotion  Keri Anti-Bacterial Hand Lotion  Keri Deep Conditioning Original Lotion Dry Skin Formula Softly Scented  Keri Deep Conditioning Original Lotion, Fragrance Free Sensitive Skin Formula  Keri Lotion Fast Absorbing Fragrance Free  Sensitive Skin Formula  Keri Lotion Fast Absorbing Softly Scented Dry Skin Formula  Keri Original Lotion  Keri Skin Renewal Lotion Keri Silky Smooth Lotion  Keri Silky Smooth Sensitive Skin Lotion  Nivea Body Creamy Conditioning Oil  Nivea Body Extra Enriched Teacher, adult education Moisturizing Lotion Nivea Crme  Nivea Skin Firming Lotion  NutraDerm 30 Skin Lotion  NutraDerm Skin Lotion  NutraDerm Therapeutic Skin Cream  NutraDerm Therapeutic Skin Lotion  ProShield Protective Hand Cream  Provon moisturizing lotion

## 2023-10-23 ENCOUNTER — Encounter (HOSPITAL_COMMUNITY): Payer: Self-pay

## 2023-10-23 ENCOUNTER — Other Ambulatory Visit: Payer: Self-pay

## 2023-10-23 ENCOUNTER — Encounter (HOSPITAL_COMMUNITY)
Admission: RE | Admit: 2023-10-23 | Discharge: 2023-10-23 | Disposition: A | Discharge status: Home / Self Care | Source: Ambulatory Visit | Attending: Orthopedic Surgery | Admitting: Orthopedic Surgery

## 2023-10-23 VITALS — BP 117/65 | HR 70 | Temp 98.5°F | Ht 64.0 in | Wt 180.8 lb

## 2023-10-23 DIAGNOSIS — Z01812 Encounter for preprocedural laboratory examination: Secondary | ICD-10-CM | POA: Diagnosis present

## 2023-10-23 DIAGNOSIS — I080 Rheumatic disorders of both mitral and aortic valves: Secondary | ICD-10-CM | POA: Diagnosis not present

## 2023-10-23 DIAGNOSIS — I10 Essential (primary) hypertension: Secondary | ICD-10-CM | POA: Insufficient documentation

## 2023-10-23 DIAGNOSIS — S42201A Unspecified fracture of upper end of right humerus, initial encounter for closed fracture: Secondary | ICD-10-CM | POA: Insufficient documentation

## 2023-10-23 DIAGNOSIS — X58XXXA Exposure to other specified factors, initial encounter: Secondary | ICD-10-CM | POA: Insufficient documentation

## 2023-10-23 DIAGNOSIS — Z01818 Encounter for other preprocedural examination: Secondary | ICD-10-CM

## 2023-10-23 DIAGNOSIS — Z87891 Personal history of nicotine dependence: Secondary | ICD-10-CM | POA: Diagnosis not present

## 2023-10-23 DIAGNOSIS — Z950 Presence of cardiac pacemaker: Secondary | ICD-10-CM | POA: Insufficient documentation

## 2023-10-23 HISTORY — DX: Personal history of urinary calculi: Z87.442

## 2023-10-23 HISTORY — DX: Anxiety disorder, unspecified: F41.9

## 2023-10-23 LAB — SURGICAL PCR SCREEN
MRSA, PCR: POSITIVE — AB
Staphylococcus aureus: POSITIVE — AB

## 2023-10-23 NOTE — Progress Notes (Signed)
 For Anesthesia: PCP - Bertrum Brodie, MD  Cardiologist - Elmyra Haggard, MD   Electrophysiology: Tammie Fall, MD    ClearanceSwinyer, Leilani Punter, NP : 10/22/23  Bowel Prep reminder:  Chest x-ray -  EKG - 10/16/23 Stress Test -  ECHO - 12/31/22 Cardiac Cath -  Pacemaker/ICD device last checked:10/14/23 Pacemaker orders received: Yes Device Rep notified:Yes  Spinal Cord Stimulator:N/A  Sleep Study - N/A CPAP -   Fasting Blood Sugar - N/A Checks Blood Sugar _____ times a day Date and result of last Hgb A1c-  Last dose of GLP1 agonist- N/A GLP1 instructions:   Last dose of SGLT-2 inhibitors- N/A SGLT-2 instructions:   Blood Thinner Instructions: Aspirin  Instructions: Is on hold already. Last Dose:  Activity level: Can go up a flight of stairs and activities of daily living without stopping and without chest pain and/or shortness of breath Unable to go up a flight of stairs without shortness of breath    Anesthesia review: Hx: Aorta stenosis,Murmur,HTN,Pacemaker.  Patient denies shortness of breath, fever, cough and chest pain at PAT appointment   Patient verbalized understanding of instructions that were given to them at the PAT appointment. Patient was also instructed that they will need to review over the PAT instructions again at home before surgery.

## 2023-10-23 NOTE — Progress Notes (Signed)
PCR: + MRSA./+ STAPH 

## 2023-10-24 NOTE — Anesthesia Preprocedure Evaluation (Signed)
 Anesthesia Evaluation    Airway        Dental   Pulmonary former smoker          Cardiovascular hypertension,      Neuro/Psych    GI/Hepatic   Endo/Other    Renal/GU      Musculoskeletal   Abdominal   Peds  Hematology   Anesthesia Other Findings   Reproductive/Obstetrics                             Anesthesia Physical Anesthesia Plan  ASA:   Anesthesia Plan:    Post-op Pain Management:    Induction:   PONV Risk Score and Plan:   Airway Management Planned:   Additional Equipment:   Intra-op Plan:   Post-operative Plan:   Informed Consent:   Plan Discussed with:   Anesthesia Plan Comments: (See PAT note 10/23/2023)       Anesthesia Quick Evaluation

## 2023-10-24 NOTE — Progress Notes (Signed)
 Anesthesia Chart Review   Case: [id number] Date/Time: 10/27/23 1430   Procedure: ARTHROPLASTY, SHOULDER, TOTAL, REVERSE (Right: Shoulder)   Anesthesia type: Choice   Pre-op diagnosis: Right shoulder 4 part proximal humerus fracture   Location: [facility] ROOM 10 / [facility]   Surgeons: Janeth Medicus, MD       DISCUSSION:77 y.o. former smoker with h/o HTN, mild to moderate AS (mean gradient 18.0 mmHg, Vmax  measures 2.82 m/s on Echo 12/31/2022), sinus node dysfunction s/p PPM implant (device orders in 10/22/2023 progress note), right shoulder proximal humerus fracture scheduled for above procedure 10/27/2023 with Dr. Carter Clare.   Per cardiology preoperative evaluation 10/22/2023, "According to the Revised Cardiac Risk Index (RCRI), her Perioperative Risk of Major Cardiac Event is (%): 0.9. Her Functional Capacity in METs is: 5.38 according to the Duke Activity Status Index (DASI). The patient is doing well from a cardiac perspective. Therefore, based on ACC/AHA guidelines, the patient would be at acceptable risk for the planned procedure without further cardiovascular testing."  VS: BP 117/65   Pulse 70   Temp 36.9 C (Oral)   Ht 5\' 4"  (1.626 m)   Wt 82 kg   SpO2 98%   BMI 31.03 kg/m   PROVIDERS: Bertrum Brodie, MD is PCP   Primary Cardiologist:  Ola Berger, MD  LABS: Labs reviewed: Acceptable for surgery. (all labs ordered are listed, but only abnormal results are displayed)  Labs Reviewed  SURGICAL PCR SCREEN - Abnormal; Notable for the following components:      Result Value   MRSA, PCR POSITIVE (*)    Staphylococcus aureus POSITIVE (*)    All other components within normal limits     IMAGES:   EKG:   CV: Echo 12/31/2022 1. Left ventricular ejection fraction, by estimation, is 60 to 65%. The  left ventricle has normal function. The left ventricle has no regional  wall motion abnormalities. There is mild left ventricular hypertrophy of  the basal-septal segment.   2.  Right ventricular systolic function is normal. The right ventricular  size is normal. There is normal pulmonary artery systolic pressure. The  estimated right ventricular systolic pressure is 24.2 mmHg.   3. The mitral valve is normal in structure. Mild mitral valve  regurgitation. No evidence of mitral stenosis.   4. The aortic valve is calcified. There is mild calcification of the  aortic valve. There is mild thickening of the aortic valve. Aortic valve  regurgitation is mild to moderate. Mild to moderate aortic valve stenosis.  Aortic regurgitation PHT measures  574 msec. Aortic valve mean gradient measures 18.0 mmHg. Aortic valve Vmax  measures 2.82 m/s.   5. The inferior vena cava is normal in size with greater than 50%  respiratory variability, suggesting right atrial pressure of 3 mmHg.  Past Medical History:  Diagnosis Date   Anxiety    Aortic stenosis    Echo 7/22: EF 60-65, no RWMA, GRII DD, normal RVSF, RVSP 36.6, mild LAE, mild MR, mild-moderate TR, mild AI, moderate aortic stenosis (mean 24 mmHg, Vmax 349 cm/s, DI 0.45) // Echo 12/2021: EF 60-65, no RWMA, mod asymmetric LVH, GR 1 DD, GLS -19.2, normal RVSF, mild elevated PASP (40.5), mild LAE, mild MR, mod TR, mild to mod AI, mod AS (mean 23 mmHg, V-max 305 cm/s, DI 0.39)   Arthritis    OA   Heart murmur    refer to cardiologist note from dr Ola Berger   History of kidney stones  Hyperlipidemia    Hypertension    Left ureteral calculus    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   [facility]   Wears dentures    upper  and partial lower   Wears glasses     Past Surgical History:  Procedure Laterality Date   ABDOMINAL HYSTERECTOMY     CARDIOVASCULAR STRESS TEST  03-31-2012   normal perfusion study/  no ischemia/  ef 69%   COLONOSCOPY WITH ESOPHAGOGASTRODUODENOSCOPY (EGD)  09-21-2003   CORNEAL TRANSPLANT Left 2002   CYSTOSCOPY WITH URETEROSCOPY AND STENT PLACEMENT Left 01/07/2014   Procedure: CYSTOSCOPY WITH LEFT  RETRGRADE, URETEROSCOPY , AND LASER LITHOTRIPSY STONE EXTRACTION AND LEFT STENT PLACEMENT;  Surgeon: Andrez Banker, MD;  Location: [facility];  Service: Urology;  Laterality: Left;   DEBRIDEMENT AND CLOSURE WOUND N/A 06/19/2022   Procedure: CLOSURE OF ABDOMINAL WOUND;  Surgeon: Alger Infield, MD;  Location: [facility];  Service: Plastics;  Laterality: N/A;   ESOPHAGOGASTRODUODENOSCOPY N/A 05/19/2020   Procedure: ESOPHAGOGASTRODUODENOSCOPY (EGD);  Surgeon: Ozell Blunt, MD;  Location: [facility];  Service: Endoscopy;  Laterality: N/A;   FOREIGN BODY REMOVAL Left 02/10/2020   Procedure: Removal of retained foreign body of left great toe;  Surgeon: Amada Backer, MD;  Location: [facility];  Service: Orthopedics;  Laterality: Left;    HOLMIUM LASER APPLICATION Left 01/07/2014   Procedure: HOLMIUM LASER APPLICATION;  Surgeon: Andrez Banker, MD;  Location[facility];  Service: Urology;  Laterality: Left;   PACEMAKER IMPLANT N/A 07/15/2018   Procedure: PACEMAKER IMPLANT;  Surgeon: Tammie Fall, MD;  Location: [facility];  Service: Cardiovascular;  Laterality: N/A;   PANNICULECTOMY N/A 06/03/2022   Procedure: PANNICULECTOMY;  Surgeon: Alger Infield, MD;  Location: [facility];  Service: Plastics;  Laterality: N/A;   REVISION TOTAL KNEE ARTHROPLASTY  left 08-01-2003/  right 02-20-02006   TOTAL KNEE ARTHROPLASTY  left 03-30-2003/  right 11-05-2004   post op left knee I & D arthroscopic lavage 04-20-2003   TRANSTHORACIC ECHOCARDIOGRAM  08-28-2011  dr Maureen Sour ross   moderate lvh/  ef 60-65%/  grade 2 diastolic dysfunction/ normal lv function / hyperdynamic lv with outflow murmur / turbulance through LVOT , mild SAM/ moderate lae/ moderate tr/  trivial mv & pv    MEDICATIONS:  acetaminophen  (TYLENOL ) 500 MG tablet   amLODipine  (NORVASC ) 5 MG tablet   aspirin  EC 81 MG tablet   atorvastatin  (LIPITOR) 40 MG tablet   brimonidine (ALPHAGAN) 0.2 %  ophthalmic solution   calcitonin, salmon, (MIACALCIN/FORTICAL) 200 UNIT/ACT nasal spray   chlorthalidone  (THALITONE ) 15 MG tablet   Cholecalciferol (VITAMIN D ) 50 MCG (2000 UT) tablet   cyclobenzaprine  (FLEXERIL ) 10 MG tablet   cyclobenzaprine  (FLEXERIL ) 5 MG tablet   dorzolamide-timolol  (COSOPT) 2-0.5 % ophthalmic solution   EPINEPHrine  0.3 mg/0.3 mL IJ SOAJ injection   ferrous sulfate  325 (65 FE) MG EC tablet   HYDROcodone -acetaminophen  (NORCO) 10-325 MG tablet   latanoprost (XALATAN) 0.005 % ophthalmic solution   magnesium  oxide (MAG-OX) 400 (240 Mg) MG tablet   metoprolol  succinate (TOPROL -XL) 25 MG 24 hr tablet   Multiple Vitamins-Minerals (MULTIVITAMIN WITH MINERALS) tablet   ondansetron  (ZOFRAN ) 4 MG tablet   pantoprazole  (PROTONIX ) 40 MG tablet   polyethylene glycol (MIRALAX / GLYCOLAX) 17 g packet   potassium chloride  SA (KLOR-CON  M) 20 MEQ tablet   prednisoLONE  acetate (PRED FORTE ) 1 % ophthalmic suspension   triamcinolone  0.1% oint-Eucerin equivalent cream 1:1 mixture   No  current facility-administered medications for this encounter.    Chick Cotton Ward, PA-C [facility] [phone]

## 2023-10-27 ENCOUNTER — Encounter (HOSPITAL_COMMUNITY): Payer: Self-pay | Admitting: Orthopedic Surgery

## 2023-10-27 ENCOUNTER — Encounter (HOSPITAL_COMMUNITY)
Admission: RE | Disposition: A | Discharge status: Home / Self Care | Payer: Self-pay | Source: Home / Self Care | Attending: Orthopedic Surgery

## 2023-10-27 ENCOUNTER — Ambulatory Visit (HOSPITAL_COMMUNITY)

## 2023-10-27 ENCOUNTER — Other Ambulatory Visit: Payer: Self-pay

## 2023-10-27 ENCOUNTER — Observation Stay (HOSPITAL_COMMUNITY)
Admission: RE | Admit: 2023-10-27 | Discharge: 2023-10-28 | Disposition: A | Discharge status: Home / Self Care | Attending: Orthopedic Surgery | Admitting: Orthopedic Surgery

## 2023-10-27 ENCOUNTER — Ambulatory Visit (HOSPITAL_COMMUNITY): Admitting: Certified Registered Nurse Anesthetist

## 2023-10-27 ENCOUNTER — Ambulatory Visit (HOSPITAL_COMMUNITY): Admitting: Physician Assistant

## 2023-10-27 DIAGNOSIS — W19XXXA Unspecified fall, initial encounter: Secondary | ICD-10-CM | POA: Diagnosis not present

## 2023-10-27 DIAGNOSIS — E669 Obesity, unspecified: Secondary | ICD-10-CM | POA: Diagnosis not present

## 2023-10-27 DIAGNOSIS — Z96653 Presence of artificial knee joint, bilateral: Secondary | ICD-10-CM | POA: Insufficient documentation

## 2023-10-27 DIAGNOSIS — E785 Hyperlipidemia, unspecified: Secondary | ICD-10-CM | POA: Diagnosis not present

## 2023-10-27 DIAGNOSIS — I10 Essential (primary) hypertension: Secondary | ICD-10-CM | POA: Diagnosis not present

## 2023-10-27 DIAGNOSIS — Z950 Presence of cardiac pacemaker: Secondary | ICD-10-CM | POA: Insufficient documentation

## 2023-10-27 DIAGNOSIS — Z79899 Other long term (current) drug therapy: Secondary | ICD-10-CM | POA: Insufficient documentation

## 2023-10-27 DIAGNOSIS — Z87891 Personal history of nicotine dependence: Secondary | ICD-10-CM | POA: Insufficient documentation

## 2023-10-27 DIAGNOSIS — Z7982 Long term (current) use of aspirin: Secondary | ICD-10-CM | POA: Insufficient documentation

## 2023-10-27 DIAGNOSIS — S42241A 4-part fracture of surgical neck of right humerus, initial encounter for closed fracture: Secondary | ICD-10-CM | POA: Diagnosis present

## 2023-10-27 DIAGNOSIS — Z96611 Presence of right artificial shoulder joint: Principal | ICD-10-CM

## 2023-10-27 HISTORY — PX: REVERSE SHOULDER ARTHROPLASTY: SHX5054

## 2023-10-27 SURGERY — ARTHROPLASTY, SHOULDER, TOTAL, REVERSE
Anesthesia: General | Site: Shoulder | Laterality: Right

## 2023-10-27 MED ORDER — HYDROCODONE-ACETAMINOPHEN 5-325 MG PO TABS
1.0000 | ORAL_TABLET | ORAL | Status: DC | PRN
  Administered 2023-10-28: 2 via ORAL
  Filled 2023-10-27: qty 2

## 2023-10-27 MED ORDER — PROPOFOL 500 MG/50ML IV EMUL
INTRAVENOUS | Status: DC | PRN
  Administered 2023-10-27: 200 ug/kg/min via INTRAVENOUS

## 2023-10-27 MED ORDER — ASPIRIN 81 MG PO TBEC
81.0000 mg | DELAYED_RELEASE_TABLET | Freq: Every day | ORAL | Status: DC
  Administered 2023-10-27 – 2023-10-28 (×2): 81 mg via ORAL
  Filled 2023-10-27 (×2): qty 1

## 2023-10-27 MED ORDER — FENTANYL CITRATE PF 50 MCG/ML IJ SOSY: 25.0000 ug | PREFILLED_SYRINGE | INTRAMUSCULAR | Status: DC | PRN

## 2023-10-27 MED ORDER — ONDANSETRON HCL 4 MG/2ML IJ SOLN: 4.0000 mg | Freq: Once | INTRAMUSCULAR | Status: DC | PRN

## 2023-10-27 MED ORDER — POTASSIUM CHLORIDE CRYS ER 20 MEQ PO TBCR
20.0000 meq | EXTENDED_RELEASE_TABLET | Freq: Every day | ORAL | Status: DC
Start: 2023-10-27 — End: 2023-10-28
  Administered 2023-10-27 – 2023-10-28 (×2): 20 meq via ORAL
  Filled 2023-10-27 (×2): qty 1

## 2023-10-27 MED ORDER — HYDROCODONE-ACETAMINOPHEN 7.5-325 MG PO TABS: 1.0000 | ORAL_TABLET | ORAL | Status: DC | PRN

## 2023-10-27 MED ORDER — METOCLOPRAMIDE HCL 5 MG PO TABS: 5.0000 mg | ORAL_TABLET | Freq: Three times a day (TID) | ORAL | Status: DC | PRN

## 2023-10-27 MED ORDER — LACTATED RINGERS IV SOLN: INTRAVENOUS | Status: DC

## 2023-10-27 MED ORDER — VANCOMYCIN HCL IN DEXTROSE 1-5 GM/200ML-% IV SOLN
1000.0000 mg | INTRAVENOUS | Status: AC
  Administered 2023-10-27: 1000 mg via INTRAVENOUS
  Filled 2023-10-27: qty 200

## 2023-10-27 MED ORDER — AMLODIPINE BESYLATE 5 MG PO TABS
5.0000 mg | ORAL_TABLET | Freq: Every day | ORAL | Status: DC
  Filled 2023-10-27: qty 1

## 2023-10-27 MED ORDER — PHENOL 1.4 % MT LIQD: 1.0000 | OROMUCOSAL | Status: DC | PRN

## 2023-10-27 MED ORDER — PHENYLEPHRINE 80 MCG/ML (10ML) SYRINGE FOR IV PUSH (FOR BLOOD PRESSURE SUPPORT)
PREFILLED_SYRINGE | INTRAVENOUS | Status: DC | PRN
  Administered 2023-10-27: 160 ug via INTRAVENOUS
  Administered 2023-10-27: 80 ug via INTRAVENOUS
  Administered 2023-10-27: 160 ug via INTRAVENOUS
  Administered 2023-10-27: 80 ug via INTRAVENOUS

## 2023-10-27 MED ORDER — STERILE WATER FOR IRRIGATION IR SOLN
Status: DC | PRN
  Administered 2023-10-27: 1000 mL

## 2023-10-27 MED ORDER — MIDAZOLAM HCL 2 MG/2ML IJ SOLN: 1.0000 mg | INTRAMUSCULAR | Status: DC

## 2023-10-27 MED ORDER — ONDANSETRON HCL 4 MG/2ML IJ SOLN
INTRAMUSCULAR | Status: AC
  Filled 2023-10-27: qty 2

## 2023-10-27 MED ORDER — METOPROLOL SUCCINATE ER 25 MG PO TB24
25.0000 mg | ORAL_TABLET | Freq: Every day | ORAL | Status: DC
  Administered 2023-10-28: 25 mg via ORAL
  Filled 2023-10-27: qty 1

## 2023-10-27 MED ORDER — ACETAMINOPHEN 500 MG PO TABS
500.0000 mg | ORAL_TABLET | Freq: Four times a day (QID) | ORAL | Status: AC
  Administered 2023-10-27 – 2023-10-28 (×4): 500 mg via ORAL
  Filled 2023-10-27 (×4): qty 1

## 2023-10-27 MED ORDER — MUPIROCIN 2 % EX OINT: 1.0000 | TOPICAL_OINTMENT | Freq: Two times a day (BID) | CUTANEOUS | 0 refills | Status: AC

## 2023-10-27 MED ORDER — ROCURONIUM BROMIDE 10 MG/ML (PF) SYRINGE
PREFILLED_SYRINGE | INTRAVENOUS | Status: DC | PRN
  Administered 2023-10-27: 70 mg via INTRAVENOUS

## 2023-10-27 MED ORDER — BUPIVACAINE HCL (PF) 0.5 % IJ SOLN
INTRAMUSCULAR | Status: DC | PRN
Start: 2023-10-27 — End: 2023-10-27
  Administered 2023-10-27: 20 mL via PERINEURAL

## 2023-10-27 MED ORDER — OXYCODONE HCL 5 MG PO TABS: 5.0000 mg | ORAL_TABLET | Freq: Three times a day (TID) | ORAL | 0 refills | Status: AC | PRN

## 2023-10-27 MED ORDER — ONDANSETRON 4 MG PO TBDP
4.0000 mg | ORAL_TABLET | Freq: Three times a day (TID) | ORAL | 0 refills | Status: AC | PRN
Start: 2023-10-27 — End: ?

## 2023-10-27 MED ORDER — MENTHOL 3 MG MT LOZG: 1.0000 | LOZENGE | OROMUCOSAL | Status: DC | PRN

## 2023-10-27 MED ORDER — VANCOMYCIN HCL 1000 MG IV SOLR
INTRAVENOUS | Status: AC
  Filled 2023-10-27: qty 20

## 2023-10-27 MED ORDER — BRIMONIDINE TARTRATE 0.2 % OP SOLN
1.0000 [drp] | Freq: Two times a day (BID) | OPHTHALMIC | Status: DC
  Administered 2023-10-27 – 2023-10-28 (×2): 1 [drp] via OPHTHALMIC
  Filled 2023-10-27: qty 5

## 2023-10-27 MED ORDER — CHLORHEXIDINE GLUCONATE 0.12 % MT SOLN
15.0000 mL | Freq: Once | OROMUCOSAL | Status: AC
  Administered 2023-10-27: 15 mL via OROMUCOSAL

## 2023-10-27 MED ORDER — TRANEXAMIC ACID-NACL 1000-0.7 MG/100ML-% IV SOLN
1000.0000 mg | Freq: Once | INTRAVENOUS | Status: AC
  Administered 2023-10-27: 1000 mg via INTRAVENOUS
  Filled 2023-10-27: qty 100

## 2023-10-27 MED ORDER — ATORVASTATIN CALCIUM 40 MG PO TABS
40.0000 mg | ORAL_TABLET | Freq: Every evening | ORAL | Status: DC
  Administered 2023-10-27: 40 mg via ORAL
  Filled 2023-10-27: qty 1

## 2023-10-27 MED ORDER — DORZOLAMIDE HCL-TIMOLOL MAL 2-0.5 % OP SOLN
1.0000 [drp] | Freq: Two times a day (BID) | OPHTHALMIC | Status: DC
  Administered 2023-10-27 – 2023-10-28 (×2): 1 [drp] via OPHTHALMIC
  Filled 2023-10-27: qty 10

## 2023-10-27 MED ORDER — MAGNESIUM OXIDE -MG SUPPLEMENT 400 (240 MG) MG PO TABS
400.0000 mg | ORAL_TABLET | Freq: Every day | ORAL | Status: DC
  Administered 2023-10-27 – 2023-10-28 (×2): 400 mg via ORAL
  Filled 2023-10-27 (×2): qty 1

## 2023-10-27 MED ORDER — FENTANYL CITRATE PF 50 MCG/ML IJ SOSY
50.0000 ug | PREFILLED_SYRINGE | INTRAMUSCULAR | Status: DC
  Administered 2023-10-27: 50 ug via INTRAVENOUS
  Filled 2023-10-27: qty 1

## 2023-10-27 MED ORDER — MORPHINE SULFATE (PF) 2 MG/ML IV SOLN: 0.5000 mg | INTRAVENOUS | Status: DC | PRN

## 2023-10-27 MED ORDER — LATANOPROST 0.005 % OP SOLN
1.0000 [drp] | Freq: Every day | OPHTHALMIC | Status: DC
  Administered 2023-10-27: 1 [drp] via OPHTHALMIC
  Filled 2023-10-27: qty 2.5

## 2023-10-27 MED ORDER — CHLORHEXIDINE GLUCONATE 4 % EX SOLN: 1.0000 | CUTANEOUS | 1 refills | Status: AC

## 2023-10-27 MED ORDER — DEXAMETHASONE SODIUM PHOSPHATE 10 MG/ML IJ SOLN
INTRAMUSCULAR | Status: DC | PRN
  Administered 2023-10-27: 8 mg via INTRAVENOUS

## 2023-10-27 MED ORDER — PROPOFOL 10 MG/ML IV BOLUS
INTRAVENOUS | Status: DC | PRN
  Administered 2023-10-27: 140 mg via INTRAVENOUS

## 2023-10-27 MED ORDER — 0.9 % SODIUM CHLORIDE (POUR BTL) OPTIME
TOPICAL | Status: DC | PRN
  Administered 2023-10-27: 1000 mL

## 2023-10-27 MED ORDER — PANTOPRAZOLE SODIUM 40 MG PO TBEC
40.0000 mg | DELAYED_RELEASE_TABLET | Freq: Every day | ORAL | Status: DC
  Administered 2023-10-27 – 2023-10-28 (×2): 40 mg via ORAL
  Filled 2023-10-27 (×2): qty 1

## 2023-10-27 MED ORDER — ACETAMINOPHEN 325 MG PO TABS: 325.0000 mg | ORAL_TABLET | Freq: Four times a day (QID) | ORAL | Status: DC | PRN

## 2023-10-27 MED ORDER — VANCOMYCIN HCL 1000 MG IV SOLR
INTRAVENOUS | Status: DC | PRN
  Administered 2023-10-27: 1000 mg via TOPICAL

## 2023-10-27 MED ORDER — ORAL CARE MOUTH RINSE: 15.0000 mL | Freq: Once | OROMUCOSAL | Status: AC

## 2023-10-27 MED ORDER — METOCLOPRAMIDE HCL 5 MG/ML IJ SOLN: 5.0000 mg | Freq: Three times a day (TID) | INTRAMUSCULAR | Status: DC | PRN

## 2023-10-27 MED ORDER — ISOPROPYL ALCOHOL 70 % SOLN
Status: DC | PRN
  Administered 2023-10-27: 1 via TOPICAL

## 2023-10-27 MED ORDER — FERROUS SULFATE 325 (65 FE) MG PO TABS
325.0000 mg | ORAL_TABLET | Freq: Every day | ORAL | Status: DC
  Administered 2023-10-28: 325 mg via ORAL
  Filled 2023-10-27: qty 1

## 2023-10-27 MED ORDER — DIPHENHYDRAMINE HCL 12.5 MG/5ML PO ELIX: 12.5000 mg | ORAL_SOLUTION | ORAL | Status: DC | PRN

## 2023-10-27 MED ORDER — PREDNISOLONE ACETATE 1 % OP SUSP
1.0000 [drp] | Freq: Every day | OPHTHALMIC | Status: DC
  Administered 2023-10-28: 1 [drp] via OPHTHALMIC
  Filled 2023-10-27: qty 5

## 2023-10-27 MED ORDER — PHENYLEPHRINE HCL-NACL 20-0.9 MG/250ML-% IV SOLN
INTRAVENOUS | Status: DC | PRN
  Administered 2023-10-27: 25 ug/min via INTRAVENOUS

## 2023-10-27 MED ORDER — BUPIVACAINE LIPOSOME 1.3 % IJ SUSP
INTRAMUSCULAR | Status: DC | PRN
  Administered 2023-10-27: 10 mL via PERINEURAL

## 2023-10-27 MED ORDER — CALCITONIN (SALMON) 200 UNIT/ACT NA SOLN
1.0000 | Freq: Every day | NASAL | Status: DC
  Filled 2023-10-27: qty 3.7

## 2023-10-27 MED ORDER — ONDANSETRON HCL 4 MG/2ML IJ SOLN
INTRAMUSCULAR | Status: DC | PRN
  Administered 2023-10-27: 4 mg via INTRAVENOUS

## 2023-10-27 MED ORDER — CHLORTHALIDONE 25 MG PO TABS
12.5000 mg | ORAL_TABLET | Freq: Every day | ORAL | Status: DC
  Filled 2023-10-27: qty 1

## 2023-10-27 MED ORDER — LIDOCAINE HCL (CARDIAC) PF 100 MG/5ML IV SOSY
PREFILLED_SYRINGE | INTRAVENOUS | Status: DC | PRN
  Administered 2023-10-27: 60 mg via INTRAVENOUS

## 2023-10-27 MED ORDER — DEXAMETHASONE SODIUM PHOSPHATE 10 MG/ML IJ SOLN
INTRAMUSCULAR | Status: AC
  Filled 2023-10-27: qty 1

## 2023-10-27 MED ORDER — TRANEXAMIC ACID-NACL 1000-0.7 MG/100ML-% IV SOLN
1000.0000 mg | INTRAVENOUS | Status: AC
  Administered 2023-10-27: 1000 mg via INTRAVENOUS
  Filled 2023-10-27: qty 100

## 2023-10-27 MED ORDER — DOCUSATE SODIUM 100 MG PO CAPS
100.0000 mg | ORAL_CAPSULE | Freq: Two times a day (BID) | ORAL | Status: DC
  Administered 2023-10-27 – 2023-10-28 (×2): 100 mg via ORAL
  Filled 2023-10-27 (×2): qty 1

## 2023-10-27 MED ORDER — PROPOFOL 500 MG/50ML IV EMUL
INTRAVENOUS | Status: AC
  Filled 2023-10-27: qty 50

## 2023-10-27 MED ORDER — SUGAMMADEX SODIUM 200 MG/2ML IV SOLN
INTRAVENOUS | Status: DC | PRN
  Administered 2023-10-27: 200 mg via INTRAVENOUS

## 2023-10-27 MED ORDER — CEFAZOLIN SODIUM-DEXTROSE 2-4 GM/100ML-% IV SOLN
2.0000 g | INTRAVENOUS | Status: AC
  Administered 2023-10-27: 2 g via INTRAVENOUS
  Filled 2023-10-27: qty 100

## 2023-10-27 SURGICAL SUPPLY — 56 items
BAG COUNTER SPONGE SURGICOUNT (BAG) IMPLANT
BAG ZIPLOCK 12X15 (MISCELLANEOUS) ×1 IMPLANT
BASEPLATE GLENOSPHERE 25 (Plate) IMPLANT
BEARING HUMERAL SHLDER 36M STD (Shoulder) IMPLANT
BIT DRILL TWIST 2.7 (BIT) IMPLANT
BLADE SAG 18X100X1.27 (BLADE) ×1 IMPLANT
CEMENT BONE DEPUY (Cement) IMPLANT
CLSR STERI-STRIP ANTIMIC 1/2X4 (GAUZE/BANDAGES/DRESSINGS) IMPLANT
COOLER ICEMAN CLASSIC (MISCELLANEOUS) ×1 IMPLANT
COVER BACK TABLE 60X90IN (DRAPES) ×1 IMPLANT
COVER SURGICAL LIGHT HANDLE (MISCELLANEOUS) ×1 IMPLANT
DRAPE POUCH INSTRU U-SHP 10X18 (DRAPES) ×1 IMPLANT
DRAPE SHEET LG 3/4 BI-LAMINATE (DRAPES) ×1 IMPLANT
DRAPE SURG 17X11 SM STRL (DRAPES) ×1 IMPLANT
DRAPE SURG ORHT 6 SPLT 77X108 (DRAPES) ×2 IMPLANT
DRAPE TOP 10253 STERILE (DRAPES) ×1 IMPLANT
DRAPE U-SHAPE 47X51 STRL (DRAPES) ×1 IMPLANT
DRSG AQUACEL AG ADV 3.5X 6 (GAUZE/BANDAGES/DRESSINGS) IMPLANT
DRSG AQUACEL AG ADV 3.5X10 (GAUZE/BANDAGES/DRESSINGS) IMPLANT
DURAPREP 26ML APPLICATOR (WOUND CARE) ×1 IMPLANT
ELECT PENCIL ROCKER SW 15FT (MISCELLANEOUS) ×1 IMPLANT
ELECT REM PT RETURN 15FT ADLT (MISCELLANEOUS) ×1 IMPLANT
FACESHIELD WRAPAROUND (MASK) ×1 IMPLANT
FACESHIELD WRAPAROUND OR TEAM (MASK) ×1 IMPLANT
GLENOID SPHERE 36MM CVD +3 (Orthopedic Implant) IMPLANT
GLOVE BIO SURGEON STRL SZ7.5 (GLOVE) ×4 IMPLANT
GLOVE BIOGEL PI IND STRL 8 (GLOVE) ×2 IMPLANT
GOWN STRL REUS W/ TWL XL LVL3 (GOWN DISPOSABLE) ×2 IMPLANT
KIT BASIN OR (CUSTOM PROCEDURE TRAY) ×1 IMPLANT
KIT TURNOVER KIT A (KITS) ×1 IMPLANT
MANIFOLD NEPTUNE II (INSTRUMENTS) ×1 IMPLANT
NDL TAPERED W/ NITINOL LOOP (MISCELLANEOUS) IMPLANT
NEEDLE TAPERED W/ NITINOL LOOP (MISCELLANEOUS) IMPLANT
NS IRRIG 1000ML POUR BTL (IV SOLUTION) ×1 IMPLANT
PACK SHOULDER (CUSTOM PROCEDURE TRAY) ×1 IMPLANT
PAD COLD SHLDR WRAP-ON (PAD) ×1 IMPLANT
PIN THREADED REVERSE (PIN) IMPLANT
RESTRAINT HEAD UNIVERSAL NS (MISCELLANEOUS) ×1 IMPLANT
SCREW BONE CORT 6.5X35MM (Screw) IMPLANT
SCREW BONE LOCKING 4.75X30X3.5 (Screw) IMPLANT
SCREW LOCKING 4.75MMX15MM (Screw) IMPLANT
SCREW LOCKING STRL 4.75X25X3.5 (Screw) IMPLANT
SLING ARM IMMOBILIZER MED (SOFTGOODS) ×1 IMPLANT
STEM HUMERAL STRL 13MMX83MM (Stem) IMPLANT
STRIP CLOSURE SKIN 1/2X4 (GAUZE/BANDAGES/DRESSINGS) ×1 IMPLANT
SUT MAXBRAID #5 CCS-NDL 2PK (SUTURE) IMPLANT
SUT MNCRL AB 3-0 PS2 18 (SUTURE) ×1 IMPLANT
SUT MON AB 2-0 CT1 36 (SUTURE) ×1 IMPLANT
SUT VIC AB 0 CT1 36 (SUTURE) ×1 IMPLANT
SUT VIC AB 1 CT1 36 (SUTURE) ×1 IMPLANT
SUTURE FIBERWR #2 38 T-5 BLUE (SUTURE) IMPLANT
SUTURE TAPE 1.3 40 TPR END (SUTURE) ×1 IMPLANT
TOWEL OR 17X26 10 PK STRL BLUE (TOWEL DISPOSABLE) ×1 IMPLANT
TOWER SMARTMIX MINI (MISCELLANEOUS) IMPLANT
TRAY HUM REV SHOULDER STD +6 (Shoulder) IMPLANT
TUBE SUCTION HIGH CAP CLEAR NV (SUCTIONS) ×1 IMPLANT

## 2023-10-27 NOTE — Anesthesia Postprocedure Evaluation (Signed)
 Anesthesia Post Note  Patient: Kelsey [name]  Procedure(s) Performed: ARTHROPLASTY, SHOULDER, TOTAL, REVERSE (Right: Shoulder)     Patient location during evaluation: PACU Anesthesia Type: General Level of consciousness: awake and alert and oriented Pain management: pain level controlled Vital Signs Assessment: post-procedure vital signs reviewed and stable Respiratory status: spontaneous breathing, nonlabored ventilation and respiratory function stable Cardiovascular status: blood pressure returned to baseline and stable Postop Assessment: no apparent nausea or vomiting Anesthetic complications: no   No notable events documented.  Last Vitals:  Vitals:   10/27/23 1530 10/27/23 1545  BP: 128/64 120/62  Pulse: 66 67  Resp: 16 18  Temp:    SpO2: 100% 100%    Last Pain:  Vitals:   10/27/23 1545  TempSrc:   PainSc: Asleep                 Kenyatte Chatmon A.

## 2023-10-27 NOTE — Op Note (Signed)
 Date: 10/27/2023   PRE-OPERATIVE DIAGNOSIS:  Right four part proximal humerus fracture   POST-OPERATIVE DIAGNOSIS:  Same   PROCEDURE:  1. Right REVERSE SHOULDER ARTHROPLASTY for fracture    SURGEON:  Janeth Medicus, MD   ASSISTANT:  Karyl Paget, PA-C  Assistant attestation:  PA McClung present scrubbed for the entire procedure.  ANESTHESIA:   General with a block   ESTIMATED BLOOD LOSS: 150 cc   PREOPERATIVE INDICATIONS: Kelsey [name]  is a right-hand-dominant 78 year old female who sustained a four-part right proximal humerus fracture following a fall.  Due to the comminution and displaced nature of the fracture and head splitting components we discussed operative management.  We discussed moving forward with reverse shoulder arthroplasty for his injury to allow earlier weight bearing on a walker and/or cane as needed and lower risk of AVN, non union or progression of shoulder arthritis following the fracture. Thus we elected to proceed with reverse shoulder arthroplasty for the plan.  The risks benefits and alternatives were discussed with the patient preoperatively including but not limited to the risks of infection, bleeding, nerve injury, cardiopulmonary complications, the need for revision surgery, dislocation, brachial plexus palsy, incomplete relief of pain, among others, and the patient was willing to proceed. The patient  provided informed consent.   OPERATIVE IMPLANTS: Biomet size 13 comprehensive mini humeral stem press fitted with humeral head autograft with a 40mm standard polyethylene liner on a +6 taper offset humeral tray and a 36 mm +3 glenosphere with a 25 mm (mini) baseplate and 4, 4.75 locking screws and one central 6.5 mm nonlocking screw.   OPERATIVE FINDINGS: Displaced four-part proximal humerus fracture with medialization of the humeral shaft fragment.  Also of note, she had deficient rotator cuff of the supraspinatus and anterior half of the infraspinatus.   Subscapularis was intact.  Long head of biceps tendon was intact as well and attached to the superior glenoid tubercle, however with significant flattening and disease.   OPERATIVE PROCEDURE: The patient was brought to the operating room and placed in the supine position. General anesthesia was administered. IV antibiotics were given. Time out was performed. The upper extremity was prepped and draped in usual sterile fashion. The patient was in a beachchair position. Deltopectoral approach was carried out. After dissection through skin and subcutaneous fat, the cephalic vein was identified with the deltopectoral interval.  This was mobilized and taken lateral.   The fracture was identified and working through the fracture in the biceps groove the lesser and greater tuberosities were freed up and tagged with # 5 max braid sutures.  The humeral head was fragmented and removed from the wound.     Next, the long head of the biceps tendon was tenotomized.   I then performed circumferential releases of the humerus.  I then moved to sizing the humerus.  There was minimal calcar bone loss and this was used to reference the height of the stem, as was the upper border of the pectoralis major tendon.  The canal was reamed and found to fit best with a 13 mm (mini.   We next turned to the glenoid.  Deep retractors were placed, and I resected the labrum as well as the residual long head of biceps, and then placed a guidepin into the center position on the glenoid, with slight inferior declination. I then reamed over the guidepin, and this created a small metaphyseal cancellus blush inferiorly, removing just the cartilage to the subchondral bone superiorly. The base plate was  selected and impacted place, and then I secured it centrally with a nonlocking screw, and I had excellent purchase both inferiorly and superiorly. I placed a short locking screws on anterior aspect,  And posterior aspect.   I then turned my  attention to the glenosphere, and impacted this into place, placing slight inferior offset.    The glenoid sphere was completely seated, and had engagement of the [facility] taper. I then turned my attention back to the humerus.    The size 13 stem was seated to the appropriate height to allow approximate 5.6 cm from the top of the Pectoralis major tendon to the top of the glenosphere.  The stem was placed in 30 degrees of retroversion.   Humeral head autograft placed in the proximal canal for impaction grafting of the humeral stem.  Once the stem was impacted into place we trialed poly liners.  Using the +6 mm extend offset humeral metaglen,  The shoulder had excellent motion, and was stable.  The final poly was impacted and again showed good motion and stability.  Next, I irrigated the wounds copiously.    The greater tuberosity was brought back to the humeral stem suture holes and secured with bone graft from the humeral head as augment.  I then irrigated the shoulder copiously once more, placed a gram of vancomycin  powder into the wound, repaired the deltopectoral interval with Vicryl followed by subcutaneous monocryl and then subcuticular monocryl with Steri-Strips and sterile gauze for the skin. The patient was awakened and returned back in stable and satisfactory condition. There no complications and he tolerated the procedure well.  All counts were correct.  The patient awakened from general anesthesia with no complications and transferred to PACU in stable condition.   Postoperative Plan: Kelsey [name] will remain in her sling until her regional block has worn off, but is ok to remove it for use with his walker and she may move the arm with pendulums and scapular traction as tolerated..  We will allow use of the arm immediately as well for activities of daily living below shoulder height.  The sling will be worn for sleep for 6 weeks. She will be admitted for pain control and we will see her back in  2 weeks for a wound check.  She will take bid ASA for DVT ppx for 4 weeks

## 2023-10-27 NOTE — Brief Op Note (Signed)
 10/27/2023  2:43 PM  PATIENT:  Kelsey [name]  78 y.o. female  PRE-OPERATIVE DIAGNOSIS:  Right shoulder 4 part proximal humerus fracture  POST-OPERATIVE DIAGNOSIS:  Right shoulder 4 part proximal humerus fracture  PROCEDURE:  Procedure(s): ARTHROPLASTY, SHOULDER, TOTAL, REVERSE (Right)  SURGEON:  Surgeons and Role:    * Janeth Medicus, MD - Primary  PHYSICIAN ASSISTANT: Karyl Paget, PA-C   ANESTHESIA:   regional and general  EBL:  150 cc  BLOOD ADMINISTERED:none  DRAINS: none   LOCAL MEDICATIONS USED:  NONE  SPECIMEN:  No Specimen  DISPOSITION OF SPECIMEN:  N/A  COUNTS:  YES  TOURNIQUET:  * No tourniquets in log *  DICTATION: .Note written in EPIC  PLAN OF CARE: Admit to inpatient   PATIENT DISPOSITION:  PACU - hemodynamically stable.   Delay start of Pharmacological VTE agent (>24hrs) due to surgical blood loss or risk of bleeding: not applicable

## 2023-10-27 NOTE — Anesthesia Procedure Notes (Signed)
 Procedure Name: Intubation Date/Time: 10/27/2023 1:11 PM  Performed by: Anuel Sitter D, CRNAPre-anesthesia Checklist: Patient identified, Emergency Drugs available, Suction available and Patient being monitored Patient Re-evaluated:Patient Re-evaluated prior to induction Oxygen Delivery Method: Circle system utilized Preoxygenation: Pre-oxygenation with 100% oxygen Induction Type: IV induction Ventilation: Mask ventilation without difficulty Laryngoscope Size: Mac and 3 Tube type: Oral Tube size: 7.0 mm Number of attempts: 1 Airway Equipment and Method: Stylet and Oral airway Placement Confirmation: ETT inserted through vocal cords under direct vision, positive ETCO2 and breath sounds checked- equal and bilateral Secured at: 20 cm Tube secured with: Tape Dental Injury: Teeth and Oropharynx as per pre-operative assessment

## 2023-10-27 NOTE — Transfer of Care (Signed)
 Immediate Anesthesia Transfer of Care Note  Patient: Kelsey [name]  Procedure(s) Performed: ARTHROPLASTY, SHOULDER, TOTAL, REVERSE (Right: Shoulder)  Patient Location: PACU  Anesthesia Type:General and Regional  Level of Consciousness: drowsy and pateint uncooperative  Airway & Oxygen Therapy: Patient Spontanous Breathing and Patient connected to face mask oxygen  Post-op Assessment: Report given to RN and Post -op Vital signs reviewed and stable  Post vital signs: Reviewed and stable  Last Vitals:  Vitals Value Taken Time  BP 115/59 10/27/23 1519  Temp    Pulse 65 10/27/23 1523  Resp 19 10/27/23 1523  SpO2 100 % 10/27/23 1523  Vitals shown include unfiled device data.  Last Pain:  Vitals:   10/27/23 1224  TempSrc:   PainSc: 7          Complications: No notable events documented.

## 2023-10-27 NOTE — H&P (Signed)
 ORTHOPAEDIC H and P  REQUESTING PHYSICIAN: Janeth Medicus, MD  PCP:  Bertrum Brodie, MD  Chief Complaint: Right proximal humerus fracture  HPI: Kelsey [name] is a 78 y.o. female who complains of right shoulder pain following a fall.  She was independent with ADLs prior.  The injury resulted in 4 part proximal humerus fracture and is indicated for reverse arthroplasty due to displacement and dominant extremity.  No new complaints since our office visit.  Past Medical History:  Diagnosis Date   Anxiety    Aortic stenosis    Echo 7/22: EF 60-65, no RWMA, GRII DD, normal RVSF, RVSP 36.6, mild LAE, mild MR, mild-moderate TR, mild AI, moderate aortic stenosis (mean 24 mmHg, Vmax 349 cm/s, DI 0.45) // Echo 12/2021: EF 60-65, no RWMA, mod asymmetric LVH, GR 1 DD, GLS -19.2, normal RVSF, mild elevated PASP (40.5), mild LAE, mild MR, mod TR, mild to mod AI, mod AS (mean 23 mmHg, V-max 305 cm/s, DI 0.39)   Arthritis    OA   Heart murmur    refer to cardiologist note from dr Ola Berger   History of kidney stones    Hyperlipidemia    Hypertension    Left ureteral calculus    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   [facility]   Wears dentures    upper  and partial lower   Wears glasses    Past Surgical History:  Procedure Laterality Date   ABDOMINAL HYSTERECTOMY     CARDIOVASCULAR STRESS TEST  03-31-2012   normal perfusion study/  no ischemia/  ef 69%   COLONOSCOPY WITH ESOPHAGOGASTRODUODENOSCOPY (EGD)  09-21-2003   CORNEAL TRANSPLANT Left 2002   CYSTOSCOPY WITH URETEROSCOPY AND STENT PLACEMENT Left 01/07/2014   Procedure: CYSTOSCOPY WITH LEFT RETRGRADE, URETEROSCOPY , AND LASER LITHOTRIPSY STONE EXTRACTION AND LEFT STENT PLACEMENT;  Surgeon: Andrez Banker, MD;  Location: [facility];  Service: Urology;  Laterality: Left;   DEBRIDEMENT AND CLOSURE WOUND N/A 06/19/2022   Procedure: CLOSURE OF ABDOMINAL WOUND;  Surgeon: Alger Infield, MD;  Location: [facility];  Service: Plastics;  Laterality: N/A;   ESOPHAGOGASTRODUODENOSCOPY N/A 05/19/2020   Procedure: ESOPHAGOGASTRODUODENOSCOPY (EGD);  Surgeon: Ozell Blunt, MD;  Location: [facility];  Service: Endoscopy;  Laterality: N/A;   FOREIGN BODY REMOVAL Left 02/10/2020   Procedure: Removal of retained foreign body of left great toe;  Surgeon: Amada Backer, MD;  Location: [facility];  Service: Orthopedics;  Laterality: Left;    HOLMIUM LASER APPLICATION Left 01/07/2014   Procedure: HOLMIUM LASER APPLICATION;  Surgeon: Andrez Banker, MD;  Location: [facility];  Service: Urology;  Laterality: Left;   PACEMAKER IMPLANT N/A 07/15/2018   Procedure: PACEMAKER IMPLANT;  Surgeon: Tammie Fall, MD;  Location: [facility];  Service: Cardiovascular;  Laterality: N/A;   PANNICULECTOMY N/A 06/03/2022   Procedure: PANNICULECTOMY;  Surgeon: Alger Infield, MD;  Location: [facility];  Service: Plastics;  Laterality: N/A;   REVISION TOTAL KNEE ARTHROPLASTY  left 08-01-2003/  right 02-20-02006   TOTAL KNEE ARTHROPLASTY  left 03-30-2003/  right 11-05-2004   post op left knee I & D arthroscopic lavage 04-20-2003   TRANSTHORACIC ECHOCARDIOGRAM  08-28-2011  dr Maureen Sour ross   moderate lvh/  ef 60-65%/  grade 2 diastolic dysfunction/ normal lv function / hyperdynamic lv with outflow murmur / turbulance through LVOT , mild SAM/ moderate lae/ moderate tr/  trivial mv & pv   Social History  Socioeconomic History   Marital status: Widowed    Spouse name: Not on file   Number of children: Not on file   Years of education: Not on file   Highest education level: Not on file  Occupational History   Not on file  Tobacco Use   Smoking status: Former    Current packs/day: 0.00    Types: Cigarettes    Quit date: 02/28/2022    Years since quitting: 1.6   Smokeless tobacco: Never   Tobacco comments:    per pt occasional smoker  Vaping Use   Vaping status: Never Used  Substance and  Sexual Activity   Alcohol  use: Yes    Alcohol /week: 3.0 standard drinks of alcohol     Types: 3 Glasses of wine per week    Comment: social   Drug use: No   Sexual activity: Not on file  Other Topics Concern   Not on file  Social History Narrative   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 Widowed - husband died 2011/11/21. Occasional smoking and Etoh.   Social Drivers of Corporate investment banker Strain: Not on file  Food Insecurity: Not on file  Transportation Needs: Not on file  Physical Activity: Not on file  Stress: Not on file  Social Connections: Not on file   Family History  Problem Relation Age of Onset   Hypertension Father    Hypertension Brother    Allergies  Allergen Reactions   Bee Venom Anaphylaxis    ALL BEE STINGS   Oxycodone -Acetaminophen  Itching   Prior to Admission medications   Medication Sig Start Date End Date Taking? Authorizing Provider  acetaminophen  (TYLENOL ) 500 MG tablet Take 1 tablet (500 mg total) by mouth every 6 (six) hours as needed. 06/18/23  Yes Debbra Fairy, PA-C  amLODipine  (NORVASC ) 5 MG tablet Take 1 tablet (5 mg total) by mouth daily. 02/03/23  Yes Elmyra Haggard, MD  aspirin  EC 81 MG tablet Take 81 mg by mouth daily. Swallow whole.   Yes [provider]  atorvastatin  (LIPITOR) 40 MG tablet Take 40 mg by mouth every evening. 04/13/13  Yes Elmyra Haggard, MD  brimonidine (ALPHAGAN) 0.2 % ophthalmic solution Place 1 drop into the left eye in the morning and at bedtime. 08/30/23  Yes [provider]  calcitonin, salmon, (MIACALCIN/FORTICAL) 200 UNIT/ACT nasal spray Place 1 spray into alternate nostrils daily. 04/30/23  Yes [provider]  Cholecalciferol (VITAMIN D ) 50 MCG (2000 UT) tablet Take 2,000 Units by mouth daily.   Yes [provider]  cyclobenzaprine  (FLEXERIL ) 5 MG tablet Take 5-10 mg by mouth at bedtime as needed for muscle spasms.   Yes [provider]  dorzolamide-timolol   (COSOPT) 2-0.5 % ophthalmic solution Place 1 drop into the left eye 2 (two) times daily. 05/13/23  Yes [provider]  EPINEPHrine  0.3 mg/0.3 mL IJ SOAJ injection Inject 0.3 mg into the muscle as needed for anaphylaxis.   Yes [provider]  ferrous sulfate  325 (65 FE) MG EC tablet Take 325 mg by mouth daily. 05/06/19  Yes [provider]  HYDROcodone -acetaminophen  (NORCO) 10-325 MG tablet Take 1 tablet by mouth 2 (two) times daily as needed for severe pain. 06/19/22  Yes Thimmappa, Lisabeth Rider, MD  latanoprost (XALATAN) 0.005 % ophthalmic solution Place 1 drop into the left eye at bedtime. 10/19/23  Yes [provider]  magnesium  oxide (MAG-OX) 400 (240 Mg) MG tablet Take 400 mg by mouth daily.  Yes [provider]  metoprolol  succinate (TOPROL -XL) 25 MG 24 hr tablet Take 25 mg by mouth daily.   Yes [provider]  Multiple Vitamins-Minerals (MULTIVITAMIN WITH MINERALS) tablet Take 1 tablet by mouth daily.   Yes [provider]  pantoprazole  (PROTONIX ) 40 MG tablet Take 40 mg by mouth daily.   Yes [provider]  polyethylene glycol (MIRALAX / GLYCOLAX) 17 g packet Take 17 g by mouth daily as needed for moderate constipation.   Yes [provider]  potassium chloride  SA (KLOR-CON  M) 20 MEQ tablet Take 20 mEq by mouth daily.   Yes [provider]  prednisoLONE  acetate (PRED FORTE ) 1 % ophthalmic suspension Place 1 drop into the left eye daily. 02/04/20  Yes [provider]  chlorthalidone  (THALITONE ) 15 MG tablet Take 1 tablet (15 mg total) by mouth daily. 02/04/23   Elmyra Haggard, MD  cyclobenzaprine  (FLEXERIL ) 10 MG tablet Take 1 tablet (10 mg total) by mouth 2 (two) times daily as needed for muscle spasms. Patient not taking: Reported on 10/21/2023 06/18/23   Debbra Fairy, PA-C  ondansetron  (ZOFRAN ) 4 MG tablet Take 1 tablet (4 mg total) by mouth every 8 (eight) hours as needed. Patient not taking: Reported on  10/21/2023 10/15/23   Debbra Fairy, PA-C  triamcinolone  0.1% oint-Eucerin equivalent cream 1:1 mixture Apply topically 2 (two) times daily. Patient not taking: Reported on 10/21/2023 08/01/22   Gretel Leaven, PA-C   No results found.  Positive ROS: All other systems have been reviewed and were otherwise negative with the exception of those mentioned in the HPI and as above.  Physical Exam: General: Alert, no acute distress Cardiovascular: No pedal edema Respiratory: No cyanosis, no use of accessory musculature GI: No organomegaly, abdomen is soft and non-tender Skin: No lesions in the area of chief complaint Neurologic: Sensation intact distally Psychiatric: Patient is competent for consent with normal mood and affect Lymphatic: No axillary or cervical lymphadenopathy  MUSCULOSKELETAL: RUE- bruising in axilla.  Nvi.  No open wounds  Assessment: 4 part right proximal humerus fracture  Plan: - plan for reverse today  - The risks, benefits, and alternatives were discussed with the patient. There are risks associated with the surgery including, but not limited to, problems with anesthesia (death), infection, differences in leg length/angulation/rotation, fracture of bones, loosening or failure of implants, malunion, nonunion, hematoma (blood accumulation) which may require surgical drainage, blood clots, pulmonary embolism, nerve injury (foot drop), and blood vessel injury. The patient understands these risks and elects to proceed.  - admit post op for observation,. Possible need of inpatient and evaluation for SNF    Janeth Medicus, MD Cell [phone]    10/27/2023 10:36 AM

## 2023-10-27 NOTE — Discharge Instructions (Signed)
 Orthopedic surgery discharge instructions:  -Maintain postoperative bandage until follow-up appointment.  This is waterproof, and you may begin showering on postoperative day #3.  Do not submerge underwater.  Maintain that bandage until your follow-up appointment in 2 weeks.  -No lifting over 2 pounds with operateive arm.  You may use the arm immediately for activities of daily living such as bathing, washing your face and brushing your teeth, eating, and getting dressed.  Otherwise maintain your sling when you are out of the house and sleeping for 4 weeks.    -Apply ice liberally to the shoulder throughout the day.  For mild to moderate pain use Tylenol  and Advil as needed around-the-clock.  For breakthrough pain use oxycodone  as necessary.  -You will return to see Dr. Hiram Lukes in the office in 2 weeks for routine postoperative check with x-rays.

## 2023-10-27 NOTE — Anesthesia Procedure Notes (Signed)
 Anesthesia Regional Block: Interscalene brachial plexus block   Pre-Anesthetic Checklist: , timeout performed,  Correct Patient, Correct Site, Correct Laterality,  Correct Procedure, Correct Position, site marked,  Risks and benefits discussed,  Surgical consent,  Pre-op evaluation,  At surgeon's request and post-op pain management  Laterality: Right  Prep: chloraprep       Needles:  Injection technique: Single-shot  Needle Type: Echogenic Stimulator Needle     Needle Length: 10cm  Needle Gauge: 21   Needle insertion depth: 6 cm   Additional Needles:   Procedures:,,,, ultrasound used (permanent image in chart),,   Motor weakness within 5 minutes.  Narrative:  Start time: 10/27/2023 12:25 PM End time: 10/27/2023 12:30 PM Injection made incrementally with aspirations every 5 mL.  Performed by: Personally  Anesthesiologist: Tura Gaines, MD  Additional Notes: Timeout performed. Patient sedated. Relevant anatomy ID'd using US . Incremental 2-5ml injection of LA with frequent aspiration. Patient tolerated procedure well.

## 2023-10-28 ENCOUNTER — Other Ambulatory Visit: Payer: Self-pay

## 2023-10-28 ENCOUNTER — Encounter (HOSPITAL_COMMUNITY): Payer: Self-pay | Admitting: Orthopedic Surgery

## 2023-10-28 DIAGNOSIS — S42241A 4-part fracture of surgical neck of right humerus, initial encounter for closed fracture: Secondary | ICD-10-CM | POA: Diagnosis not present

## 2023-10-28 LAB — BASIC METABOLIC PANEL WITH GFR
Anion gap: 7 (ref 5–15)
BUN: 17 mg/dL (ref 8–23)
CO2: 23 mmol/L (ref 22–32)
Calcium: 9.5 mg/dL (ref 8.9–10.3)
Chloride: 106 mmol/L (ref 98–111)
Creatinine, Ser: 0.77 mg/dL (ref 0.44–1.00)
GFR, Estimated: >60 mL/min (ref >60)
Glucose, Bld: 130 mg/dL — ABNORMAL HIGH (ref 70–99)
Potassium: 4.2 mmol/L (ref 3.5–5.1)
Sodium: 136 mmol/L (ref 135–145)

## 2023-10-28 LAB — HEMOGLOBIN AND HEMATOCRIT, BLOOD
HCT: 27.3 % — ABNORMAL LOW (ref 36.0–46.0)
Hemoglobin: 8.6 g/dL — ABNORMAL LOW (ref 12.0–15.0)

## 2023-10-28 MED ORDER — ORAL CARE MOUTH RINSE: 15.0000 mL | OROMUCOSAL | Status: DC | PRN

## 2023-10-28 NOTE — Progress Notes (Signed)
   10/28/23 1348  TOC Brief Assessment  Insurance and Status Reviewed  Patient has primary care physician Yes  Home environment has been reviewed Single family home with son  Prior level of function: Independent  Prior/Current Home Services No current home services  Readmission risk has been reviewed Yes (N/A)  Transition of care needs no transition of care needs at this time   MOON given. Pt will DC home with son/daughter. Pt denies any SDOH, HH, DME needs. There are no TOC needs at this time.

## 2023-10-28 NOTE — Progress Notes (Signed)
   Subjective:  Kelsey [name] is a 78 y.o. female, 1 Day Post-Op    s/p Procedure(s): ARTHROPLASTY, SHOULDER, TOTAL, REVERSE   Patient reports pain as mild to moderate.  Block still partially active. Able to wiggle fingers. Wearing sling, saw OT and about to work with PT.   Objective:   VITALS:   Vitals:   10/27/23 2254 10/28/23 0159 10/28/23 0552 10/28/23 1058  BP: 105/64 94/61 101/60 105/61  Pulse: 72 82 78 73  Resp: 18 18 18 16   Temp: 97.9 F (36.6 C) 98 F (36.7 C) 98.5 F (36.9 C) 98.4 F (36.9 C)  TempSrc: Oral Oral Oral   SpO2: 97% 98% 98% 100%  Weight:      Height:       NAD  RUE:  Neurovascular intact Sensation intact distally Intact pulses distally Dorsiflexion/Plantar flexion intact Incision: dressing C/D/I Compartment soft Able to wiggle fingers, not able to actively move elbow secondary to block.   Lab Results  Component Value Date   WBC 9.6 10/15/2023   HGB 8.6 (L) 10/28/2023   HCT 27.3 (L) 10/28/2023   MCV 90.6 10/15/2023   PLT 231 10/15/2023   BMET    Component Value Date/Time   NA 136 10/28/2023 0332   NA 143 01/31/2023 1033   K 4.2 10/28/2023 0332   CL 106 10/28/2023 0332   CO2 23 10/28/2023 0332   GLUCOSE 130 (H) 10/28/2023 0332   BUN 17 10/28/2023 0332   BUN 27 01/31/2023 1033   CREATININE 0.77 10/28/2023 0332   CALCIUM  9.5 10/28/2023 0332   EGFR 72 01/31/2023 1033   GFRNONAA >60 10/28/2023 0332     Assessment/Plan: 1 Day Post-Op   Principal Problem:   S/P reverse total shoulder arthroplasty, right   Advance diet Up with therapy Dispo: D/c home today or tomorrow pending OT/PT progress for safe ambulation   Weightbearing Status: will remain in her sling until her regional block has worn off, but is ok to remove it for use with his walker and she may move the arm with pendulums and scapular traction as tolerated..  We will allow use of the arm immediately as well for activities of daily living below shoulder height.   The sling will be worn for sleep for 6 weeks. No internal rotation. No lifting more than 1-2 pounds.  DVT Prophylaxis: aspirin    Kelsey [name] 10/28/2023, 11:56 AM  Kelsey Paget PA-C  Physician Assistant with Dr. Link Rice [place]

## 2023-10-28 NOTE — Progress Notes (Signed)
 Occupational Therapy Treatment Patient Details Name: Kelsey [name] MRN: [id number] DOB: 1945/06/29 Today's Date: 10/28/2023   History of present illness Patient is a 78 year old female who presented on 5/12 with four part humeral fractures on RUE. patient underwent  Right reverse shoulder arthroplasty for fracture on 5/12. PMH: cataract surgery,Anxiety, heart murmur, R TKA, L TKA,   OT comments  s/p shoulder replacement without functional use of dominant RUE secondary to effects of surgery and interscalene block and shoulder precautions. Therapist provided education and instruction to patient in regards to exercises, precautions, positioning, donning upper extremity clothing and bathing while maintaining shoulder precautions, ice and edema management, use of ice machine and donning/doffing sling. Patient verbalized understanding and demonstrated as needed. Patient needed assistance to donn shirt, underwear, pants, socks and shoes and provided with instruction on compensatory strategies to perform ADLs. Patient to follow up with MD for further therapy needs.        If plan is discharge home, recommend the following:  A little help with walking and/or transfers;A little help with bathing/dressing/bathroom;Assistance with cooking/housework;Direct supervision/assist for medications management;Assist for transportation;Help with stairs or ramp for entrance;Direct supervision/assist for financial management   Equipment Recommendations  None recommended by OT       Precautions / Restrictions Precautions Precautions: Shoulder Type of Shoulder Precautions: ok for hand wrist and elbow movement, no abduction, AROM/PROM FF 0-90, AROM/PROM ER 0-30. Shoulder Interventions: Off for dressing/bathing/exercises;Shoulder sling/immobilizer;At all times Precaution Booklet Issued: Yes (comment) Restrictions Weight Bearing Restrictions Per Provider Order: Yes RUE Weight Bearing Per Provider Order: Non weight  bearing       Mobility Bed Mobility Overal bed mobility: Needs Assistance Bed Mobility: Supine to Sit     Supine to sit: Min assist, HOB elevated     General bed mobility comments: to scoot forwards to EOB with increased time.      Balance Overall balance assessment: Mild deficits observed, not formally tested           ADL either performed or assessed with clinical judgement   ADL Overall ADL's : Needs assistance/impaired Eating/Feeding: Set up;Sitting Eating/Feeding Details (indicate cue type and reason): limited use of RUE still impacting opening small packets sitting in recliner.               Upper Body Dressing Details (indicate cue type and reason): max A for sling management                   General ADL Comments: patients breakfast arrived with patient requesting to eat while its warm prior to continuation of education for shoudler. patient was educated on ice machine use during set up for breakfast. patient verbalized understanding.    Extremity/Trunk Assessment Upper Extremity Assessment Upper Extremity Assessment: RUE deficits/detail;Right hand dominant RUE Deficits / Details: in sling RUE: Unable to fully assess due to immobilization RUE Coordination: decreased gross motor            Vision Baseline Vision/History: 1 Wears glasses Additional Comments: patient reported having history of eye surgery with eye drops needed at specific time each day. nurse made aware.             Cognition Arousal: Alert Behavior During Therapy: WFL for tasks assessed/performed Cognition: No apparent impairments                               Following commands: Intact  Shoulder Instructions Shoulder Instructions Donning/doffing shirt without moving shoulder: Modified independent Method for sponge bathing under operated UE: Patient able to independently direct caregiver Donning/doffing sling/immobilizer: Patient able to  independently direct caregiver Correct positioning of sling/immobilizer: Patient able to independently direct caregiver ROM for elbow, wrist and digits of operated UE: Patient able to independently direct caregiver Sling wearing schedule (on at all times/off for ADL's): Patient able to independently direct caregiver Proper positioning of operated UE when showering: Patient able to independently direct caregiver Positioning of UE while sleeping: Patient able to independently direct caregiver          Pertinent Vitals/ Pain       Pain Assessment Pain Assessment: No/denies pain  Home Living Family/patient expects to be discharged to:: Private residence Living Arrangements: Children Available Help at Discharge: Family;Available 24 hours/day Type of Home: House Home Access: Elevator     Home Layout: One level     Bathroom Shower/Tub: Tub/shower unit         Home Equipment: None              Frequency  7X/week        Progress Toward Goals  OT Goals(current goals can now be found in the care plan section)  Progress towards OT goals: Progressing toward goals  Acute Rehab OT Goals OT Goal Formulation: With patient Time For Goal Achievement: 11/11/23 Potential to Achieve Goals: Fair ADL Goals Pt Will Perform Upper Body Dressing: with modified independence Pt Will Transfer to Toilet: with modified independence;ambulating;regular height toilet Additional ADL Goal #1: patient to complete ADLs while maintaining ROM and WB restrictions with MI  Plan         AM-PAC OT "6 Clicks" Daily Activity     Outcome Measure   Help from another person eating meals?: A Little Help from another person taking care of personal grooming?: A Little Help from another person toileting, which includes using toliet, bedpan, or urinal?: A Little Help from another person bathing (including washing, rinsing, drying)?: A Little Help from another person to put on and taking off regular upper  body clothing?: A Little Help from another person to put on and taking off regular lower body clothing?: A Little 6 Click Score: 18    End of Session Equipment Utilized During Treatment: Gait belt  OT Visit Diagnosis: Unsteadiness on feet (R26.81);Other abnormalities of gait and mobility (R26.89)   Activity Tolerance Patient tolerated treatment well   Patient Left in chair;with call bell/phone within reach   Nurse Communication Mobility status        Time: 1001-1036 OT Time Calculation (min): 35 min  Charges: OT General Charges $OT Visit: 1 Visit OT Evaluation $OT Eval Low Complexity: 1 Low OT Treatments $Self Care/Home Management : 23-37 mins  Osiel Stick OTR/L, MS Acute Rehabilitation Department Office# [phone]   Jame Maze 10/28/2023, 12:52 PM

## 2023-10-28 NOTE — Evaluation (Addendum)
 Occupational Therapy Evaluation Patient Details Name: Kelsey [name] MRN: [id number] DOB: 08/13/1945 Today's Date: 10/28/2023   History of Present Illness   Patient is a 78 year old female who presented on 5/12 with four part humeral fractures on RUE. patient underwent  Right reverse shoulder arthroplasty for fracture on 5/12. PMH: cataract surgery,Anxiety, heart murmur, R TKA, L TKA,     Clinical Impressions Session was limited with patient wanting to eat breakfast this AM. Patient was min A to transfer to EOB with patient reporting that her son will be home with her to help as needed. Patient noted to kick out her LLE to attempt to transition into standing with patient reporting this is how she does it at home. Patient was noted to have decreased functional activity tolerance, decreased knowledge of shoulder precautions and how to maintain them in ADLs, decreased standing balance, decreased safety awareness, and decreased knowledge of AD/AE impacting participation in ADLs. OT to continue to follow and see again later this AM.       If plan is discharge home, recommend the following:   A lot of help with bathing/dressing/bathroom;A lot of help with walking and/or transfers;Assistance with cooking/housework;Direct supervision/assist for medications management;Direct supervision/assist for financial management;Help with stairs or ramp for entrance;Assist for transportation     Functional Status Assessment   Patient has had a recent decline in their functional status and demonstrates the ability to make significant improvements in function in a reasonable and predictable amount of time.     Equipment Recommendations   None recommended by OT      Precautions/Restrictions   Precautions Precautions: Shoulder Type of Shoulder Precautions: ok for hand wrist and elbow movement, no abduction, AROM/PROM FF 0-90, AROM/PROM ER 0-30. Shoulder Interventions: Off for  dressing/bathing/exercises;Shoulder sling/immobilizer;At all times Precaution Booklet Issued: Yes (comment) Restrictions Weight Bearing Restrictions Per Provider Order: Yes RUE Weight Bearing Per Provider Order: Non weight bearing     Mobility Bed Mobility Overal bed mobility: Needs Assistance Bed Mobility: Supine to Sit     Supine to sit: Min assist, HOB elevated     General bed mobility comments: to scoot forwards to EOB with increased time.       Balance Overall balance assessment: Mild deficits observed, not formally tested                                         ADL either performed or assessed with clinical judgement   ADL Overall ADL's : Needs assistance/impaired Eating/Feeding: Set up;Sitting Eating/Feeding Details (indicate cue type and reason): limited use of RUE still impacting opening small packets sitting in recliner.               Upper Body Dressing Details (indicate cue type and reason): max A for sling management                   General ADL Comments: patients breakfast arrived with patient requesting to eat while its warm prior to continuation of education for shoudler. patient was educated on ice machine use during set up for breakfast. patient verbalized understanding.     Vision Baseline Vision/History: 1 Wears glasses Additional Comments: patient reported having history of eye surgery with eye drops needed at specific time each day. nurse made aware.            Pertinent Vitals/Pain Pain Assessment Pain Assessment: No/denies pain (  block stil in place)     Extremity/Trunk Assessment Upper Extremity Assessment Upper Extremity Assessment: RUE deficits/detail;Right hand dominant RUE Deficits / Details: in sling RUE: Unable to fully assess due to immobilization RUE Coordination: decreased gross motor           Communication     Cognition Arousal: Alert Behavior During Therapy: WFL for tasks  assessed/performed Cognition: No apparent impairments                               Following commands: Intact             Shoulder Instructions Shoulder Instructions Correct positioning of sling/immobilizer: Maximal assistance Sling wearing schedule (on at all times/off for ADL's): Maximal assistance Positioning of UE while sleeping: Maximal assistance    Home Living Family/patient expects to be discharged to:: Private residence Living Arrangements: Children Available Help at Discharge: Family;Available 24 hours/day Type of Home: House Home Access: Elevator     Home Layout: One level     Bathroom Shower/Tub: Tub/shower unit         Home Equipment: None          Prior Functioning/Environment Prior Level of Function : Independent/Modified Independent                    OT Problem List: Impaired balance (sitting and/or standing);Decreased activity tolerance;Decreased knowledge of use of DME or AE;Impaired UE functional use;Decreased safety awareness;Decreased knowledge of precautions   OT Treatment/Interventions: Self-care/ADL training;DME and/or AE instruction;Therapeutic activities;Balance training;Energy conservation;Patient/family education      OT Goals(Current goals can be found in the care plan section)   Acute Rehab OT Goals OT Goal Formulation: With patient Time For Goal Achievement: 11/11/23 Potential to Achieve Goals: Fair   OT Frequency:  7X/week       AM-PAC OT "6 Clicks" Daily Activity     Outcome Measure Help from another person eating meals?: A Little Help from another person taking care of personal grooming?: A Little Help from another person toileting, which includes using toliet, bedpan, or urinal?: A Lot Help from another person bathing (including washing, rinsing, drying)?: A Lot Help from another person to put on and taking off regular upper body clothing?: A Lot Help from another person to put on and taking off  regular lower body clothing?: A Lot 6 Click Score: 14   End of Session Equipment Utilized During Treatment: Gait belt;Rolling walker (2 wheels)  Activity Tolerance: Patient tolerated treatment well Patient left: in chair;with call bell/phone within reach  OT Visit Diagnosis: Unsteadiness on feet (R26.81);Other abnormalities of gait and mobility (R26.89)                Time: 0981-1914 OT Time Calculation (min): 23 min Charges:  OT General Charges $OT Visit: 1 Visit OT Evaluation $OT Eval Low Complexity: 1 Low OT Treatments $Self Care/Home Management : 8-22 mins  Wynette Heckler, MS Acute Rehabilitation Department Office# [phone]   Jame Maze 10/28/2023, 10:43 AM

## 2023-10-28 NOTE — Care Management Obs Status (Signed)
 MEDICARE OBSERVATION STATUS NOTIFICATION   Patient Details  Name: Kelsey [name] MRN: [id number] Date of Birth: [birth year]   Medicare Observation Status Notification Given:       Tessie Fila, RN 10/28/2023, 1:43 PM

## 2023-10-28 NOTE — Discharge Summary (Signed)
 Patient ID: Kelsey [name] MRN: [id number] DOB/AGE: 1946/04/08 78 y.o.  Admit date: 10/27/2023 Discharge date: 10/28/2023  Primary Diagnosis: Right proximal humerus fracture Admission Diagnoses: s/p right reverse total shoulder for fracture Past Medical History:  Diagnosis Date   Anxiety    Aortic stenosis    Echo 7/22: EF 60-65, no RWMA, GRII DD, normal RVSF, RVSP 36.6, mild LAE, mild MR, mild-moderate TR, mild AI, moderate aortic stenosis (mean 24 mmHg, Vmax 349 cm/s, DI 0.45) // Echo 12/2021: EF 60-65, no RWMA, mod asymmetric LVH, GR 1 DD, GLS -19.2, normal RVSF, mild elevated PASP (40.5), mild LAE, mild MR, mod TR, mild to mod AI, mod AS (mean 23 mmHg, V-max 305 cm/s, DI 0.39)   Arthritis    OA   Heart murmur    refer to cardiologist note from dr Ola Berger   History of kidney stones    Hyperlipidemia    Hypertension    Left ureteral calculus    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   Weight as of this encounter: 82 kg.  Procedure:  Procedure(s) (LRB): ARTHROPLASTY, SHOULDER, TOTAL, REVERSE (Right)   Consults: None  HPI: Kelsey [name] is a 78 y.o. female who complains of right shoulder pain following a fall.  She was independent with ADLs prior.  The injury resulted in 4 part proximal humerus fracture and is indicated for reverse arthroplasty due to displacement and dominant extremity. She underwent surgery on 10/27/2023 and was admitted for post op monitoring and therapy.  Laboratory Data: Admission on 10/27/2023  Component Date Value Ref Range Status   Hemoglobin 10/28/2023 8.6 (L)  12.0 - 15.0 g/dL Final   HCT 40/98/1191 27.3 (L)  36.0 - 46.0 % Final   Performed at  [facility] [address], [place], [place] [address]   Sodium 10/28/2023 136  135 - 145 mmol/L Final   Potassium 10/28/2023 4.2  3.5 - 5.1 mmol/L Final   Chloride 10/28/2023 106  98 - 111 mmol/L Final   CO2 10/28/2023 23  22 - 32 mmol/L Final   Glucose, Bld 10/28/2023 130 (H)  70 - 99 mg/dL Final   Glucose reference range applies only to samples taken after fasting for at least 8 hours.   BUN 10/28/2023 17  8 - 23 mg/dL Final   Creatinine, Ser 10/28/2023 0.77  0.44 - 1.00 mg/dL Final   Calcium  10/28/2023 9.5  8.9 - 10.3 mg/dL Final   GFR, Estimated 10/28/2023 >60  >60 mL/min Final   Comment: (NOTE) Calculated using the CKD-EPI Creatinine Equation (2021)    Anion gap 10/28/2023 7  5 - 15 Final   Performed at [facility], [address] [place], [place] [address]  Hospital Outpatient Visit on 10/23/2023  Component Date Value Ref Range Status   MRSA, PCR 10/23/2023 POSITIVE (A)  NEGATIVE Final   Comment: RESULT CALLED TO, READ BACK BY AND VERIFIED WITH: L.ROSERO, RN AT 1620 ON 10/23/23 BY N.THOMPSON    Staphylococcus aureus 10/23/2023 POSITIVE (A)  NEGATIVE Final   Comment: (NOTE) The Xpert SA Assay (FDA approved for NASAL specimens in patients 22 years of  age and older), is one component of a comprehensive surveillance program. It is not intended to diagnose infection nor to guide or monitor treatment. Performed at [facility], [address] [place], [place] [address]   Admission on 10/15/2023, Discharged on 10/15/2023  Component Date Value Ref Range Status   Sodium 10/15/2023 138  135 - 145 mmol/L Final   Potassium 10/15/2023 3.6  3.5 - 5.1 mmol/L Final   Chloride 10/15/2023 106  98 - 111 mmol/L Final   CO2 10/15/2023 21 (L)  22 - 32 mmol/L Final   Glucose, Bld 10/15/2023 112 (H)  70 - 99 mg/dL Final   Glucose reference range applies only to samples taken after fasting for at least 8 hours.   BUN 10/15/2023 20  8 -  23 mg/dL Final   Creatinine, Ser 10/15/2023 0.78  0.44 - 1.00 mg/dL Final   Calcium  10/15/2023 10.1  8.9 - 10.3 mg/dL Final   GFR, Estimated 10/15/2023 >60  >60 mL/min Final   Comment: (NOTE) Calculated using the CKD-EPI Creatinine Equation (2021)    Anion gap 10/15/2023 11  5 - 15 Final   Performed at [facility], [address], [place], [place] [address]   WBC 10/15/2023 9.6  4.0 - 10.5 K/uL Final   RBC 10/15/2023 4.35  3.87 - 5.11 MIL/uL Final   Hemoglobin 10/15/2023 12.5  12.0 - 15.0 g/dL Final   HCT 82/95/6213 39.4  36.0 - 46.0 % Final   MCV 10/15/2023 90.6  80.0 - 100.0 fL Final   MCH 10/15/2023 28.7  26.0 - 34.0 pg Final   MCHC 10/15/2023 31.7  30.0 - 36.0 g/dL Final   RDW 08/65/7846 13.8  11.5 - 15.5 % Final   Platelets 10/15/2023 231  150 - 400 K/uL Final   nRBC 10/15/2023 0.0  0.0 - 0.2 % Final   Performed at [facility], [address] [place], [place] [address]   Glucose-Capillary 10/15/2023 131 (H)  70 - 99 mg/dL Final   Glucose reference range applies only to samples taken after fasting for at least 8 hours.  Appointment on 10/09/2023  Component Date Value Ref Range Status   Date Time Interrogation Session 10/08/2023 [id number]   Final   Pulse Generator Manufacturer 10/08/2023 MERM   Final   Pulse Gen Model 10/08/2023 [id number] XT DR MRI   Final   Pulse Gen Serial Number 10/08/2023 [id number]   Final   Clinic Name 10/08/2023 [facility]   Final   Implantable Pulse Generator Type 10/08/2023 Implantable Pulse Generator   Final   Implantable Pulse Generator Implan* 10/08/2023 [id number]   Final   Implantable Lead Manufacturer 10/08/2023 MERM   Final   Implantable Lead Model 10/08/2023 3830 SelectSecure   Final   Implantable Lead Serial Number 10/08/2023 [id number]   Final   Implantable Lead Implant Date 10/08/2023 [id number]   Final   Implantable Lead Location Detail 1 10/08/2023 SEPTUM   Final   Implantable Lead Location  10/08/2023 [id number]   Final   Implantable Lead Connection Status 10/08/2023 010932   Final   Implantable Lead Manufacturer 10/08/2023 MERM   Final   Implantable Lead Model 10/08/2023 5076 CapSureFix Novus MRI SureScan   Final   Implantable Lead Serial Number 10/08/2023 [id number]   Final   Implantable Lead Implant Date 10/08/2023 [id number]   Final   Implantable Lead Location Detail 1 10/08/2023 APPENDAGE   Final   Implantable Lead Location 10/08/2023 [id number]   Final  Implantable Lead Connection Status 10/08/2023 [id number]   Final   Lead Channel Setting Sensing Sensi* 10/08/2023 1.2  mV Final   Lead Channel Setting Pacing Amplit* 10/08/2023 1.5  V Final   Lead Channel Setting Pacing Pulse * 10/08/2023 0.4  ms Final   Lead Channel Setting Pacing Amplit* 10/08/2023 2  V Final   Zone Setting Status 10/08/2023 755011   Final   Zone Setting Status 10/08/2023 755011   Final   Lead Channel Impedance Value 10/08/2023 380  ohm Final   Lead Channel Impedance Value 10/08/2023 342  ohm Final   Lead Channel Sensing Intrinsic Amp* 10/08/2023 2.5  mV Final   Lead Channel Sensing Intrinsic Amp* 10/08/2023 2.5  mV Final   Lead Channel Pacing Threshold Ampl* 10/08/2023 0.625  V Final   Lead Channel Pacing Threshold Puls* 10/08/2023 0.4  ms Final   Lead Channel Impedance Value 10/08/2023 494  ohm Final   Lead Channel Impedance Value 10/08/2023 323  ohm Final   Lead Channel Sensing Intrinsic Amp* 10/08/2023 17.25  mV Final   Lead Channel Sensing Intrinsic Amp* 10/08/2023 17.25  mV Final   Lead Channel Pacing Threshold Ampl* 10/08/2023 0.5  V Final   Lead Channel Pacing Threshold Puls* 10/08/2023 0.4  ms Final   Battery Status 10/08/2023 OK   Final   Battery Remaining Longevity 10/08/2023 123  mo Final   Battery Voltage 10/08/2023 3.00  V Final   Brady Statistic RA Percent Paced 10/08/2023 27.77  % Final   Brady Statistic RV Percent Paced 10/08/2023 0.14  % Final   Brady Statistic AP VP Percent 10/08/2023 0.09   % Final   Brady Statistic AS VP Percent 10/08/2023 0.05  % Final   Brady Statistic AP VS Percent 10/08/2023 27.66  % Final   Brady Statistic AS VS Percent 10/08/2023 72.2  % Final     X-Rays:DG Shoulder Right Port Result Date: 10/27/2023 CLINICAL DATA:  Status post reverse right shoulder arthroplasty. EXAM: RIGHT SHOULDER - 1 VIEW COMPARISON:  Radiograph 10/15/2023 FINDINGS: Reverse right shoulder arthroplasty in expected alignment. Fracture about the proximal humerus with present on preoperative exam. Recent postsurgical change includes air and edema in the joint space and soft tissues. IMPRESSION: Reverse right shoulder arthroplasty. Electronically Signed   By: Chadwick Colonel M.D.   On: 10/27/2023 16:02   CT HEAD WO CONTRAST Result Date: 10/15/2023 CLINICAL DATA:  Blunt facial trauma. Tripped and fell getting out of car and hit face on ground. Upper lip laceration. EXAM: CT HEAD WITHOUT CONTRAST CT MAXILLOFACIAL WITHOUT CONTRAST CT CERVICAL SPINE WITHOUT CONTRAST TECHNIQUE: Multidetector CT imaging of the head, cervical spine, and maxillofacial structures were performed using the standard protocol without intravenous contrast. Multiplanar CT image reconstructions of the cervical spine and maxillofacial structures were also generated. RADIATION DOSE REDUCTION: This exam was performed according to the departmental dose-optimization program which includes automated exposure control, adjustment of the mA and/or kV according to patient size and/or use of iterative reconstruction technique. COMPARISON:  MRI head 05/31/2018 and CT 05/31/2018 FINDINGS: CT HEAD FINDINGS Brain: No intracranial hemorrhage, mass effect, or evidence of acute infarct. No hydrocephalus. No extra-axial fluid collection. Age related cerebral atrophy and chronic small vessel ischemic disease. Vascular: No hyperdense vessel. Intracranial arterial calcification. Skull: No fracture or focal lesion. Other: None. CT MAXILLOFACIAL FINDINGS  Osseous: No fracture or mandibular dislocation. No destructive process. Orbits: Negative. No traumatic or inflammatory finding. Sinuses: Clear. Soft tissues: Swelling about the upper lip. CT CERVICAL SPINE FINDINGS  Alignment: No evidence of traumatic listhesis. Skull base and vertebrae: No acute fracture. Soft tissues and spinal canal: No prevertebral fluid or swelling. No visible canal hematoma. Disc levels: Mild multilevel spondylosis. Moderate to advanced multilevel facet arthropathy. No severe spinal canal narrowing. Upper chest: No acute abnormality. Other: None. IMPRESSION: 1. No acute intracranial abnormality. 2. No acute facial bone fracture. Swelling about the upper lip. 3. No acute fracture in the cervical spine. Electronically Signed   By: Kelsey [name] M.D.   On: 10/15/2023 19:07   CT Maxillofacial Wo Contrast Result Date: 10/15/2023 CLINICAL DATA:  Blunt facial trauma. Tripped and fell getting out of car and hit face on ground. Upper lip laceration. EXAM: CT HEAD WITHOUT CONTRAST CT MAXILLOFACIAL WITHOUT CONTRAST CT CERVICAL SPINE WITHOUT CONTRAST TECHNIQUE: Multidetector CT imaging of the head, cervical spine, and maxillofacial structures were performed using the standard protocol without intravenous contrast. Multiplanar CT image reconstructions of the cervical spine and maxillofacial structures were also generated. RADIATION DOSE REDUCTION: This exam was performed according to the departmental dose-optimization program which includes automated exposure control, adjustment of the mA and/or kV according to patient size and/or use of iterative reconstruction technique. COMPARISON:  MRI head 05/31/2018 and CT 05/31/2018 FINDINGS: CT HEAD FINDINGS Brain: No intracranial hemorrhage, mass effect, or evidence of acute infarct. No hydrocephalus. No extra-axial fluid collection. Age related cerebral atrophy and chronic small vessel ischemic disease. Vascular: No hyperdense vessel. Intracranial arterial  calcification. Skull: No fracture or focal lesion. Other: None. CT MAXILLOFACIAL FINDINGS Osseous: No fracture or mandibular dislocation. No destructive process. Orbits: Negative. No traumatic or inflammatory finding. Sinuses: Clear. Soft tissues: Swelling about the upper lip. CT CERVICAL SPINE FINDINGS Alignment: No evidence of traumatic listhesis. Skull base and vertebrae: No acute fracture. Soft tissues and spinal canal: No prevertebral fluid or swelling. No visible canal hematoma. Disc levels: Mild multilevel spondylosis. Moderate to advanced multilevel facet arthropathy. No severe spinal canal narrowing. Upper chest: No acute abnormality. Other: None. IMPRESSION: 1. No acute intracranial abnormality. 2. No acute facial bone fracture. Swelling about the upper lip. 3. No acute fracture in the cervical spine. Electronically Signed   By: Kelsey [name] M.D.   On: 10/15/2023 19:07   CT Cervical Spine Wo Contrast Result Date: 10/15/2023 CLINICAL DATA:  Blunt facial trauma. Tripped and fell getting out of car and hit face on ground. Upper lip laceration. EXAM: CT HEAD WITHOUT CONTRAST CT MAXILLOFACIAL WITHOUT CONTRAST CT CERVICAL SPINE WITHOUT CONTRAST TECHNIQUE: Multidetector CT imaging of the head, cervical spine, and maxillofacial structures were performed using the standard protocol without intravenous contrast. Multiplanar CT image reconstructions of the cervical spine and maxillofacial structures were also generated. RADIATION DOSE REDUCTION: This exam was performed according to the departmental dose-optimization program which includes automated exposure control, adjustment of the mA and/or kV according to patient size and/or use of iterative reconstruction technique. COMPARISON:  MRI head 05/31/2018 and CT 05/31/2018 FINDINGS: CT HEAD FINDINGS Brain: No intracranial hemorrhage, mass effect, or evidence of acute infarct. No hydrocephalus. No extra-axial fluid collection. Age related cerebral atrophy and  chronic small vessel ischemic disease. Vascular: No hyperdense vessel. Intracranial arterial calcification. Skull: No fracture or focal lesion. Other: None. CT MAXILLOFACIAL FINDINGS Osseous: No fracture or mandibular dislocation. No destructive process. Orbits: Negative. No traumatic or inflammatory finding. Sinuses: Clear. Soft tissues: Swelling about the upper lip. CT CERVICAL SPINE FINDINGS Alignment: No evidence of traumatic listhesis. Skull base and vertebrae: No acute fracture. Soft tissues and spinal canal: No prevertebral fluid  or swelling. No visible canal hematoma. Disc levels: Mild multilevel spondylosis. Moderate to advanced multilevel facet arthropathy. No severe spinal canal narrowing. Upper chest: No acute abnormality. Other: None. IMPRESSION: 1. No acute intracranial abnormality. 2. No acute facial bone fracture. Swelling about the upper lip. 3. No acute fracture in the cervical spine. Electronically Signed   By: Kelsey [name] M.D.   On: 10/15/2023 19:07   DG Shoulder Right Result Date: 10/15/2023 CLINICAL DATA:  Status post fall. EXAM: RIGHT SHOULDER - 2+ VIEW COMPARISON:  None Available. FINDINGS: An acute, comminuted fracture deformity is seen involving the head and neck of the proximal right humerus. Approximately 1 shaft width medial displacement of the distal fracture site is seen. There is no evidence of dislocation. Degenerative changes are seen involving the right acromial clavicular joint and right glenohumeral articulation. Lateral soft tissue swelling is noted. IMPRESSION: Acute fracture of the proximal right humerus. Electronically Signed   By: Kelsey [name] M.D.   On: 10/15/2023 16:20   CUP PACEART REMOTE DEVICE CHECK Result Date: 10/09/2023 PPM scheduled remote reviewed. Normal device function.  Presenting rhythm: AS-VS Next remote 91 days. AB, CVRS   EKG: Orders placed or performed during the hospital encounter of 10/15/23   ED EKG   ED EKG   EKG 12-Lead   EKG  12-Lead   EKG   EKG     Hospital Course: Kelsey [name] is a 78 y.o. who was admitted to Hospital. They were brought to the operating room on 10/27/2023 and underwent Procedure(s): ARTHROPLASTY, SHOULDER, TOTAL, REVERSE.  Patient tolerated the procedure well and was later transferred to the recovery room and then to the orthopaedic floor for postoperative care.  They were given PO and IV analgesics for pain control following their surgery.  They were given 24 hours of postoperative antibiotics of  Anti-infectives (From admission, onward)    Start     Dose/Rate Route Frequency Ordered Stop   10/27/23 1345  vancomycin  (VANCOCIN ) powder  Status:  Discontinued          As needed 10/27/23 1345 10/27/23 1716   10/27/23 1115  ceFAZolin  (ANCEF ) IVPB 2g/100 mL premix        2 g 200 mL/hr over 30 Minutes Intravenous On call to O.R. 10/27/23 1108 10/27/23 1324   10/27/23 1115  vancomycin  (VANCOCIN ) IVPB 1000 mg/200 mL premix        1,000 mg 200 mL/hr over 60 Minutes Intravenous On call to O.R. 10/27/23 1108 10/27/23 1252      and started on DVT prophylaxis in the form of Aspirin .   PT and OT were ordered for total joint protocol.  Discharge planning consulted to help with postop disposition and equipment needs.  Patient had an uneventful night on the evening of surgery.  They started to get up OOB with therapy on day one.   Patient was seen in rounds and was ready to go home.   Diet: Regular diet Activity:NWB 1-2 pounds, ok for use with walker Follow-up:in 2 weeks Disposition - Home Discharged Condition: good   Discharge Instructions     Call MD / Call 911   Complete by: As directed    If you experience chest pain or shortness of breath, CALL 911 and be transported to the hospital emergency room.  If you develope a fever above 101 F, pus (white drainage) or increased drainage or redness at the wound, or calf pain, call your surgeon's office.   Constipation Prevention   Complete  by: As  directed    Drink plenty of fluids.  Prune juice may be helpful.  You may use a stool softener, such as Colace (over the counter) 100 mg twice a day.  Use MiraLax (over the counter) for constipation as needed.   Diet - low sodium heart healthy   Complete by: As directed    Increase activity slowly as tolerated   Complete by: As directed    Post-operative opioid taper instructions:   Complete by: As directed    POST-OPERATIVE OPIOID TAPER INSTRUCTIONS: It is important to wean off of your opioid medication as soon as possible. If you do not need pain medication after your surgery it is ok to stop day one. Opioids include: Codeine, Hydrocodone (Norco, Vicodin), Oxycodone (Percocet, oxycontin ) and hydromorphone  amongst others.  Long term and even short term use of opiods can cause: Increased pain response Dependence Constipation Depression Respiratory depression And more.  Withdrawal symptoms can include Flu like symptoms Nausea, vomiting And more Techniques to manage these symptoms Hydrate well Eat regular healthy meals Stay active Use relaxation techniques(deep breathing, meditating, yoga) Do Not substitute Alcohol  to help with tapering If you have been on opioids for less than two weeks and do not have pain than it is ok to stop all together.  Plan to wean off of opioids This plan should start within one week post op of your joint replacement. Maintain the same interval or time between taking each dose and first decrease the dose.  Cut the total daily intake of opioids by one tablet each day Next start to increase the time between doses. The last dose that should be eliminated is the evening dose.         Allergies as of 10/28/2023       Reactions   Bee Venom Anaphylaxis   ALL BEE STINGS   Oxycodone -acetaminophen  Itching        Medication List     TAKE these medications    acetaminophen  500 MG tablet Commonly known as: TYLENOL  Take 1 tablet (500 mg total) by  mouth every 6 (six) hours as needed.   amLODipine  5 MG tablet Commonly known as: NORVASC  Take 1 tablet (5 mg total) by mouth daily.   aspirin  EC 81 MG tablet Take 81 mg by mouth daily. Swallow whole.   atorvastatin  40 MG tablet Commonly known as: LIPITOR Take 40 mg by mouth every evening.   brimonidine 0.2 % ophthalmic solution Commonly known as: ALPHAGAN Place 1 drop into the left eye in the morning and at bedtime.   calcitonin (salmon) 200 UNIT/ACT nasal spray Commonly known as: MIACALCIN/FORTICAL Place 1 spray into alternate nostrils daily.   chlorhexidine  4 % external liquid Commonly known as: HIBICLENS  Apply 15 mLs (1 Application total) topically as directed for 30 doses. Use as directed daily for 5 days every other week for 6 weeks.   chlorthalidone  15 MG tablet Commonly known as: THALITONE  Take 1 tablet (15 mg total) by mouth daily.   cyclobenzaprine  5 MG tablet Commonly known as: FLEXERIL  Take 5-10 mg by mouth at bedtime as needed for muscle spasms.   cyclobenzaprine  10 MG tablet Commonly known as: FLEXERIL  Take 1 tablet (10 mg total) by mouth 2 (two) times daily as needed for muscle spasms.   dorzolamide-timolol  2-0.5 % ophthalmic solution Commonly known as: COSOPT Place 1 drop into the left eye 2 (two) times daily.   EPINEPHrine  0.3 mg/0.3 mL Soaj injection Commonly known as: EPI-PEN Inject 0.3 mg into the muscle  as needed for anaphylaxis.   ferrous sulfate  325 (65 FE) MG EC tablet Take 325 mg by mouth daily.   HYDROcodone -acetaminophen  10-325 MG tablet Commonly known as: NORCO Take 1 tablet by mouth 2 (two) times daily as needed for severe pain.   latanoprost 0.005 % ophthalmic solution Commonly known as: XALATAN Place 1 drop into the left eye at bedtime.   magnesium  oxide 400 (240 Mg) MG tablet Commonly known as: MAG-OX Take 400 mg by mouth daily.   metoprolol  succinate 25 MG 24 hr tablet Commonly known as: TOPROL -XL Take 25 mg by mouth  daily.   multivitamin with minerals tablet Take 1 tablet by mouth daily.   mupirocin  ointment 2 % Commonly known as: BACTROBAN  Place 1 Application into the nose 2 (two) times daily for 60 doses. Use as directed 2 times daily for 5 days every other week for 6 weeks.   ondansetron  4 MG disintegrating tablet Commonly known as: ZOFRAN -ODT Take 1 tablet (4 mg total) by mouth every 8 (eight) hours as needed for nausea or vomiting.   ondansetron  4 MG tablet Commonly known as: ZOFRAN  Take 1 tablet (4 mg total) by mouth every 8 (eight) hours as needed.   oxyCODONE  5 MG immediate release tablet Commonly known as: Roxicodone  Take 1 tablet (5 mg total) by mouth every 8 (eight) hours as needed.   pantoprazole  40 MG tablet Commonly known as: PROTONIX  Take 40 mg by mouth daily.   polyethylene glycol 17 g packet Commonly known as: MIRALAX / GLYCOLAX Take 17 g by mouth daily as needed for moderate constipation.   potassium chloride  SA 20 MEQ tablet Commonly known as: KLOR-CON  M Take 20 mEq by mouth daily.   prednisoLONE  acetate 1 % ophthalmic suspension Commonly known as: PRED FORTE  Place 1 drop into the left eye daily.   triamcinolone  0.1% oint-Eucerin equivalent cream 1:1 mixture Apply topically 2 (two) times daily.   Vitamin D  50 MCG (2000 UT) tablet Take 2,000 Units by mouth daily.        Follow-up Information     Janeth Medicus, MD Follow up in 2 week(s).   Specialty: Orthopedic Surgery Why: For wound re-check Contact information: [address] [place] [address] [phone]                 Signed: Karyl Paget PA-C Orthopaedic Surgery 10/28/2023, 12:46 PM

## 2023-10-28 NOTE — Plan of Care (Signed)
  Problem: Education: Goal: Knowledge of General Education information will improve Description: Including pain rating scale, medication(s)/side effects and non-pharmacologic comfort measures Outcome: Progressing   Problem: Health Behavior/Discharge Planning: Goal: Ability to manage health-related needs will improve Outcome: Progressing   Problem: Clinical Measurements: Goal: Ability to maintain clinical measurements within normal limits will improve Outcome: Adequate for Discharge Goal: Will remain free from infection Outcome: Progressing Goal: Diagnostic test results will improve Outcome: Progressing Goal: Respiratory complications will improve Outcome: Progressing Goal: Cardiovascular complication will be avoided Outcome: Progressing   Problem: Activity: Goal: Risk for activity intolerance will decrease Outcome: Progressing   Problem: Nutrition: Goal: Adequate nutrition will be maintained Outcome: Completed/Met   Problem: Coping: Goal: Level of anxiety will decrease Outcome: Progressing   Problem: Elimination: Goal: Will not experience complications related to bowel motility Outcome: Progressing Goal: Will not experience complications related to urinary retention Outcome: Completed/Met   Problem: Pain Managment: Goal: General experience of comfort will improve and/or be controlled Outcome: Progressing   Problem: Safety: Goal: Ability to remain free from injury will improve Outcome: Progressing   Problem: Skin Integrity: Goal: Risk for impaired skin integrity will decrease Outcome: Progressing   Problem: Education: Goal: Knowledge of the prescribed therapeutic regimen will improve Outcome: Progressing Goal: Understanding of activity limitations/precautions following surgery will improve Outcome: Adequate for Discharge Goal: Individualized Educational Video(s) Outcome: Completed/Met   Problem: Activity: Goal: Ability to tolerate increased activity will  improve Outcome: Progressing   Problem: Pain Management: Goal: Pain level will decrease with appropriate interventions Outcome: Adequate for Discharge

## 2023-10-28 NOTE — Evaluation (Signed)
 Physical Therapy Evaluation Patient Details Name: Kelsey [name] MRN: [id number] DOB: [birth year] Today's Date: 10/28/2023  History of Present Illness  Patient is a 78 year old female who presented on 5/12 with four part humeral fractures on RUE. patient underwent  Right reverse shoulder arthroplasty for fracture on 5/12. PMH: cataract surgery,Anxiety, heart murmur, R TKA, L TKA,  Clinical Impression  Pt admitted with above diagnosis.  Pt currently with functional limitations due to the deficits listed below (see PT Problem List). Pt will benefit from acute skilled PT to increase their independence and safety with mobility to allow discharge.     The patient  ambulated with RW, PT assisted  with right side ans patient reported that the right shoulder was beginning to ache after PT assisted th hand up to the RW(MD gave clearance to use RW). Patient  reports daughter will be available  x 2 days after Dc, son also in house, intermittently. Patient did ambulate x 2' , PT recommends that she use her rollator at home for a few days.      If plan is discharge home, recommend the following: A little help with walking and/or transfers;A little help with bathing/dressing/bathroom;Help with stairs or ramp for entrance   Can travel by private vehicle        Equipment Recommendations None recommended by PT  Recommendations for Other Services       Functional Status Assessment Patient has had a recent decline in their functional status and demonstrates the ability to make significant improvements in function in a reasonable and predictable amount of time.     Precautions / Restrictions Precautions Precautions: Shoulder Type of Shoulder Precautions: ok for hand wrist and elbow movement, no abduction, AROM/PROM FF 0-90, AROM/PROM ER 0-30. Shoulder Interventions: Off for dressing/bathing/exercises;Shoulder sling/immobilizer; OK to remove to use RW. Precaution/Restrictions Comments: ok to use RW  forward positiont only Restrictions Other Position/Activity Restrictions: ok for use of RW      Mobility  Bed Mobility               General bed mobility comments: in recl;iner    Transfers Overall transfer level: Needs assistance Equipment used: Rolling walker (2 wheels)               General transfer comment: patient  kicks out the LLE upon standing, stating due to not beig able to flex the knee  . patient rocks for momentum to stand up    Ambulation/Gait Ambulation/Gait assistance: Contact guard assist Gait Distance (Feet): 60 Feet Assistive device: Rolling walker (2 wheels) Gait Pattern/deviations: Step-through pattern       General Gait Details: initially placed RUE on RW , then reported increase in pain  so placed hand   through gait belt for support and therapist pushed on R side of RW.  Stairs            Wheelchair Mobility     Tilt Bed    Modified Rankin (Stroke Patients Only)       Balance Overall balance assessment: Mild deficits observed, not formally tested                                           Pertinent Vitals/Pain Pain Assessment Pain Assessment: 0-10 Pain Score: 3  Pain Descriptors / Indicators: Aching Pain Intervention(s): Patient requesting pain meds-RN notified    Home Living Family/patient  expects to be discharged to:: Private residence Living Arrangements: Children Available Help at Discharge: Family;Available 24 hours/day Type of Home: House Home Access: Stairs to enter Entrance Stairs-Rails: Doctor, general practice of Steps: 3   Home Layout: One level Home Equipment: None Additional Comments: dtr  and son    Prior Function Prior Level of Function : Independent/Modified Independent                     Extremity/Trunk Assessment   Upper Extremity Assessment Upper Extremity Assessment: RUE deficits/detail;Right hand dominant RUE Deficits / Details: in sling, removed  sling to use RW, asssited  RUE to the RW, RUE: Unable to fully assess due to immobilization RUE Coordination: decreased gross motor    Lower Extremity Assessment Lower Extremity Assessment: Overall WFL for tasks assessed       Communication        Cognition Arousal: Alert Behavior During Therapy: WFL for tasks assessed/performed   PT - Cognitive impairments: No apparent impairments                         Following commands: Intact       Cueing       General Comments      Exercises     Assessment/Plan    PT Assessment Patient needs continued PT services  PT Problem List Decreased strength;Decreased range of motion;Decreased balance;Decreased activity tolerance;Pain       PT Treatment Interventions DME instruction;Therapeutic activities;Gait training;Functional mobility training;Therapeutic exercise;Patient/family education    PT Goals (Current goals can be found in the Care Plan section)  Acute Rehab PT Goals Patient Stated Goal: go home PT Goal Formulation: With patient Time For Goal Achievement: 11/11/23 Potential to Achieve Goals: Good    Frequency Min 3X/week     Co-evaluation               AM-PAC PT "6 Clicks" Mobility  Outcome Measure Help needed turning from your back to your side while in a flat bed without using bedrails?: A Little Help needed moving from lying on your back to sitting on the side of a flat bed without using bedrails?: A Little Help needed moving to and from a bed to a chair (including a wheelchair)?: A Little Help needed standing up from a chair using your arms (e.g., wheelchair or bedside chair)?: A Little Help needed to walk in hospital room?: A Little Help needed climbing 3-5 steps with a railing? : A Little 6 Click Score: 18    End of Session Equipment Utilized During Treatment: Gait belt Activity Tolerance: Patient tolerated treatment well Patient left: in chair;with call bell/phone within reach Nurse  Communication: Mobility status PT Visit Diagnosis: Other abnormalities of gait and mobility (R26.89);Difficulty in walking, not elsewhere classified (R26.2);Pain Pain - Right/Left: Right Pain - part of body: Shoulder    Time: 6962-9528 PT Time Calculation (min) (ACUTE ONLY): 25 min   Charges:   PT Evaluation $PT Eval Low Complexity: 1 Low PT Treatments $Gait Training: 8-22 mins PT General Charges $$ ACUTE PT VISIT: 1 Visit         Kelsey [name] PT Acute Rehabilitation Services Office [phone] Weekend [phone]   Kelsey [name] 10/28/2023, 1:43 PM

## 2023-11-18 NOTE — Progress Notes (Signed)
 Remote pacemaker transmission.

## 2023-12-23 ENCOUNTER — Ambulatory Visit: Attending: Physician Assistant | Admitting: Physician Assistant

## 2023-12-23 ENCOUNTER — Encounter: Payer: Self-pay | Admitting: Physician Assistant

## 2023-12-23 VITALS — BP 118/60 | HR 60 | Ht 64.0 in | Wt 175.6 lb

## 2023-12-23 DIAGNOSIS — I350 Nonrheumatic aortic (valve) stenosis: Secondary | ICD-10-CM | POA: Diagnosis not present

## 2023-12-23 DIAGNOSIS — I5042 Chronic combined systolic (congestive) and diastolic (congestive) heart failure: Secondary | ICD-10-CM

## 2023-12-23 DIAGNOSIS — I10 Essential (primary) hypertension: Secondary | ICD-10-CM

## 2023-12-23 DIAGNOSIS — E782 Mixed hyperlipidemia: Secondary | ICD-10-CM

## 2023-12-23 DIAGNOSIS — R079 Chest pain, unspecified: Secondary | ICD-10-CM | POA: Diagnosis not present

## 2023-12-23 DIAGNOSIS — Z950 Presence of cardiac pacemaker: Secondary | ICD-10-CM

## 2023-12-23 DIAGNOSIS — R0609 Other forms of dyspnea: Secondary | ICD-10-CM | POA: Diagnosis not present

## 2023-12-23 NOTE — Progress Notes (Signed)
 Cardiology Office Note:  .   Date:  12/23/2023  ID:  Kelsey [name], DOB [birth year], MRN [id number] PCP: Claudene Round, MD  [facility] Providers Cardiologist:  Vina Gull, MD Electrophysiologist:  Danelle Birmingham, MD    History of Present Illness: .   Kelsey [name] is a 78 y.o. female  with a h/o chest pain with negative Myoview  2019, mild to moderate AI, mild to moderate AS on echo 12/2022, hypertension, vigorous LV function, complete heart block s/p PPM implant 2019, former tobacco abuse,  Patient complains of anxiety attacks while driving and then heart races-has been going on  for a couple months.Sometimes happens in stressful situations.  Has DOE walking to the mailbox, no chest pain. Also fatigue. BP at home 110/72, -130/80 only on occasion.  She had total shoulder repair for fracture 10/2023 and Hbg dropped 8.6(was 12.5 in April).   ROS:    Studies Reviewed: Kelsey [name]         Prior CV Studies:    Echo 12/2022    1. Left ventricular ejection fraction, by estimation, is 60 to 65%. The  left ventricle has normal function. The left ventricle has no regional  wall motion abnormalities. There is mild left ventricular hypertrophy of  the basal-septal segment.   2. Right ventricular systolic function is normal. The right ventricular  size is normal. There is normal pulmonary artery systolic pressure. The  estimated right ventricular systolic pressure is 24.2 mmHg.   3. The mitral valve is normal in structure. Mild mitral valve  regurgitation. No evidence of mitral stenosis.   4. The aortic valve is calcified. There is mild calcification of the  aortic valve. There is mild thickening of the aortic valve. Aortic valve  regurgitation is mild to moderate. Mild to moderate aortic valve stenosis.  Aortic regurgitation PHT measures  574 msec. Aortic valve mean gradient measures 18.0 mmHg. Aortic valve Vmax  measures 2.82 m/s.   5. The inferior vena cava is normal in size with greater  than 50%  respiratory variability, suggesting right atrial pressure of 3 mmHg.       Risk Assessment/Calculations:             Physical Exam:   VS:  BP 104/64   Pulse 60   Ht 5' 4 (1.626 m)   Wt 175 lb 9.6 oz (79.7 kg)   SpO2 96%   BMI 30.14 kg/m    Orhtostatics: No data found. Wt Readings from Last 3 Encounters:  12/23/23 175 lb 9.6 oz (79.7 kg)  10/27/23 180 lb 12.4 oz (82 kg)  10/23/23 180 lb 12.4 oz (82 kg)    GEN: Well nourished, well developed in no acute distress NECK: No JVD; No carotid bruits CARDIAC:  RRR, 3/6 systolic murmur LSB RESPIRATORY:  Clear to auscultation without rales, wheezing or rhonchi  ABDOMEN: Soft, non-tender, non-distended EXTREMITIES:  No edema; No deformity   ASSESSMENT AND PLAN: .    History of chest pain, negative myoview  2019  Mild to moderate AI/mild to moderate AS on echo 12/2022-DOE and loud murmur-update echo  DOE with little activity- Hbg 8.6 in May down from 12.5 in April. On iron with dark stools. Denies BRBPR.  Check CBC and CMET today.   Essential hypertension-BP on low side but higher at home and up on recheck. No changes today. Consider reducing  amlodipine  if remains low  Complete heart block status post PPM implant 2019-palpitations with anxiety attacks only while driving or stressful situation.  Remote pacer check in April normal.  HLD-on lipitor, managed by PCP        Dispo: f/u Dr. Okey 6 months, Mr. Kelsey [name] in Jan.  Signed, Olivia Pavy, PA-C

## 2023-12-23 NOTE — Patient Instructions (Signed)
 Medication Instructions:  Your physician recommends that you continue on your current medications as directed. Please refer to the Current Medication list given to you today.  *If you need a refill on your cardiac medications before your next appointment, please call your pharmacy*  Lab Work: TODAY: CBC, CMET If you have labs (blood work) drawn today and your tests are completely normal, you will receive your results only by: MyChart Message (if you have MyChart) OR A paper copy in the mail If you have any lab test that is abnormal or we need to change your treatment, we will call you to review the results.  Testing/Procedures: Your physician has requested that you have an echocardiogram. Echocardiography is a painless test that uses sound waves to create images of your heart. It provides your doctor with information about the size and shape of your heart and how well your heart's chambers and valves are working. This procedure takes approximately one hour. There are no restrictions for this procedure. Please do NOT wear cologne, perfume, aftershave, or lotions (deodorant is allowed). Please arrive 15 minutes prior to your appointment time.  Please note: We ask at that you not bring children with you during ultrasound (echo/ vascular) testing. Due to room size and safety concerns, children are not allowed in the ultrasound rooms during exams. Our front office staff cannot provide observation of children in our lobby area while testing is being conducted. An adult accompanying a patient to their appointment will only be allowed in the ultrasound room at the discretion of the ultrasound technician under special circumstances. We apologize for any inconvenience.   Follow-Up: At [facility], you and your health needs are our priority.  As part of our continuing mission to provide you with exceptional heart care, our providers are all part of one team.  This team includes your primary  Cardiologist (physician) and Advanced Practice Providers or APPs (Physician Assistants and Nurse Practitioners) who all work together to provide you with the care you need, when you need it.  Your next appointment:   6 month(s)  Provider:   Vina Gull, MD   Your physician recommends that you schedule a follow-up appointment in: JANUARY 2026 WITH ANDY LESIA, PA-C    We recommend signing up for the patient portal called MyChart.  Sign up information is provided on this After Visit Summary.  MyChart is used to connect with patients for Virtual Visits (Telemedicine).  Patients are able to view lab/test results, encounter notes, upcoming appointments, etc.  Non-urgent messages can be sent to your provider as well.   To learn more about what you can do with MyChart, go to [email or web address].

## 2023-12-24 ENCOUNTER — Ambulatory Visit: Payer: Self-pay | Admitting: Physician Assistant

## 2023-12-24 LAB — CBC
Hematocrit: 40.6 % (ref 34.0–46.6)
Hemoglobin: 12.5 g/dL (ref 11.1–15.9)
MCH: 28.3 pg (ref 26.6–33.0)
MCHC: 30.8 g/dL — ABNORMAL LOW (ref 31.5–35.7)
MCV: 92 fL (ref 79–97)
Platelets: 266 x10E3/uL (ref 150–450)
RBC: 4.41 x10E6/uL (ref 3.77–5.28)
RDW: 12.1 % (ref 11.7–15.4)
WBC: 4.6 x10E3/uL (ref 3.4–10.8)

## 2023-12-24 LAB — COMPREHENSIVE METABOLIC PANEL WITH GFR
ALT: 23 IU/L (ref 0–32)
AST: 24 IU/L (ref 0–40)
Albumin: 4.5 g/dL (ref 3.8–4.8)
Alkaline Phosphatase: 222 IU/L — AB (ref 44–121)
BUN/Creatinine Ratio: 16 (ref 12–28)
BUN: 13 mg/dL (ref 8–27)
Bilirubin Total: 0.8 mg/dL (ref 0.0–1.2)
CO2: 21 mmol/L (ref 20–29)
Calcium: 10.6 mg/dL — AB (ref 8.7–10.3)
Chloride: 102 mmol/L (ref 96–106)
Creatinine, Ser: 0.82 mg/dL (ref 0.57–1.00)
Globulin, Total: 2.7 g/dL (ref 1.5–4.5)
Glucose: 83 mg/dL (ref 70–99)
Potassium: 4.3 mmol/L (ref 3.5–5.2)
Sodium: 139 mmol/L (ref 134–144)
Total Protein: 7.2 g/dL (ref 6.0–8.5)
eGFR: 75 mL/min/1.73 (ref >59)

## 2024-01-08 ENCOUNTER — Ambulatory Visit (INDEPENDENT_AMBULATORY_CARE_PROVIDER_SITE_OTHER): Payer: Self-pay

## 2024-01-08 DIAGNOSIS — I495 Sick sinus syndrome: Secondary | ICD-10-CM | POA: Diagnosis not present

## 2024-01-09 LAB — CUP PACEART REMOTE DEVICE CHECK
Battery Remaining Longevity: 120 mo
Battery Voltage: 3 V
Brady Statistic AP VP Percent: 0.14 %
Brady Statistic AP VS Percent: 44.45 %
Brady Statistic AS VP Percent: 0.04 %
Brady Statistic AS VS Percent: 55.37 %
Brady Statistic RA Percent Paced: 44.63 %
Brady Statistic RV Percent Paced: 0.19 %
Date Time Interrogation Session: 20250724020727
Implantable Lead Connection Status: 753985
Implantable Lead Connection Status: 753985
Implantable Lead Implant Date: 20200129
Implantable Lead Implant Date: 20200129
Implantable Lead Location: 753859
Implantable Lead Location: 753860
Implantable Lead Model: 3830
Implantable Lead Model: 5076
Implantable Pulse Generator Implant Date: 20200129
Lead Channel Impedance Value: 285 Ohm
Lead Channel Impedance Value: 304 Ohm
Lead Channel Impedance Value: 342 Ohm
Lead Channel Impedance Value: 456 Ohm
Lead Channel Pacing Threshold Amplitude: 0.5 V
Lead Channel Pacing Threshold Amplitude: 0.625 V
Lead Channel Pacing Threshold Pulse Width: 0.4 ms
Lead Channel Pacing Threshold Pulse Width: 0.4 ms
Lead Channel Sensing Intrinsic Amplitude: 15.25 mV
Lead Channel Sensing Intrinsic Amplitude: 15.25 mV
Lead Channel Sensing Intrinsic Amplitude: 2.25 mV
Lead Channel Sensing Intrinsic Amplitude: 2.25 mV
Lead Channel Setting Pacing Amplitude: 1.5 V
Lead Channel Setting Pacing Amplitude: 2 V
Lead Channel Setting Pacing Pulse Width: 0.4 ms
Lead Channel Setting Sensing Sensitivity: 1.2 mV
Zone Setting Status: 755011
Zone Setting Status: 755011

## 2024-01-12 ENCOUNTER — Ambulatory Visit: Payer: Self-pay | Admitting: Internal Medicine

## 2024-01-19 ENCOUNTER — Ambulatory Visit: Admitting: Physician Assistant

## 2024-01-30 ENCOUNTER — Ambulatory Visit (HOSPITAL_COMMUNITY)

## 2024-03-03 ENCOUNTER — Ambulatory Visit (HOSPITAL_COMMUNITY)
Admission: RE | Admit: 2024-03-03 | Discharge: 2024-03-03 | Disposition: A | Discharge status: Home / Self Care | Source: Ambulatory Visit | Attending: Cardiovascular Disease | Admitting: Cardiovascular Disease

## 2024-03-03 DIAGNOSIS — I10 Essential (primary) hypertension: Secondary | ICD-10-CM | POA: Diagnosis not present

## 2024-03-03 DIAGNOSIS — I5042 Chronic combined systolic (congestive) and diastolic (congestive) heart failure: Secondary | ICD-10-CM | POA: Diagnosis present

## 2024-03-03 DIAGNOSIS — I350 Nonrheumatic aortic (valve) stenosis: Secondary | ICD-10-CM | POA: Diagnosis not present

## 2024-03-03 DIAGNOSIS — E782 Mixed hyperlipidemia: Secondary | ICD-10-CM | POA: Diagnosis present

## 2024-03-03 DIAGNOSIS — R079 Chest pain, unspecified: Secondary | ICD-10-CM | POA: Insufficient documentation

## 2024-03-03 DIAGNOSIS — R0609 Other forms of dyspnea: Secondary | ICD-10-CM | POA: Insufficient documentation

## 2024-03-03 DIAGNOSIS — Z950 Presence of cardiac pacemaker: Secondary | ICD-10-CM | POA: Diagnosis present

## 2024-03-03 LAB — ECHOCARDIOGRAM COMPLETE
AR max vel: 2.08 cm2
AV Area VTI: 1.61 cm2
AV Area mean vel: 1.84 cm2
AV Mean grad: 17 mmHg
AV Peak grad: 30 mmHg
Ao pk vel: 2.74 m/s
Area-P 1/2: 3.27 cm2
P 1/2 time: 336 ms
S' Lateral: 2.3 cm

## 2024-03-18 NOTE — Progress Notes (Signed)
 Remote PPM Transmission

## 2024-04-08 ENCOUNTER — Ambulatory Visit: Payer: Self-pay | Admitting: Internal Medicine

## 2024-04-08 ENCOUNTER — Ambulatory Visit: Payer: Self-pay

## 2024-04-08 DIAGNOSIS — I495 Sick sinus syndrome: Secondary | ICD-10-CM

## 2024-04-08 LAB — CUP PACEART REMOTE DEVICE CHECK
Battery Remaining Longevity: 118 mo
Battery Voltage: 3 V
Brady Statistic AP VP Percent: 0.05 %
Brady Statistic AP VS Percent: 24.33 %
Brady Statistic AS VP Percent: 0.05 %
Brady Statistic AS VS Percent: 75.57 %
Brady Statistic RA Percent Paced: 24.42 %
Brady Statistic RV Percent Paced: 0.11 %
Date Time Interrogation Session: 20251023015434
Implantable Lead Connection Status: 753985
Implantable Lead Connection Status: 753985
Implantable Lead Implant Date: 20200129
Implantable Lead Implant Date: 20200129
Implantable Lead Location: 753859
Implantable Lead Location: 753860
Implantable Lead Model: 3830
Implantable Lead Model: 5076
Implantable Pulse Generator Implant Date: 20200129
Lead Channel Impedance Value: 304 Ohm
Lead Channel Impedance Value: 304 Ohm
Lead Channel Impedance Value: 342 Ohm
Lead Channel Impedance Value: 475 Ohm
Lead Channel Pacing Threshold Amplitude: 0.5 V
Lead Channel Pacing Threshold Amplitude: 0.625 V
Lead Channel Pacing Threshold Pulse Width: 0.4 ms
Lead Channel Pacing Threshold Pulse Width: 0.4 ms
Lead Channel Sensing Intrinsic Amplitude: 17.5 mV
Lead Channel Sensing Intrinsic Amplitude: 17.5 mV
Lead Channel Sensing Intrinsic Amplitude: 2.375 mV
Lead Channel Sensing Intrinsic Amplitude: 2.375 mV
Lead Channel Setting Pacing Amplitude: 1.5 V
Lead Channel Setting Pacing Amplitude: 2 V
Lead Channel Setting Pacing Pulse Width: 0.4 ms
Lead Channel Setting Sensing Sensitivity: 1.2 mV
Zone Setting Status: 755011
Zone Setting Status: 755011

## 2024-04-09 NOTE — Progress Notes (Signed)
 Remote PPM Transmission

## 2024-05-11 ENCOUNTER — Encounter: Payer: Self-pay | Admitting: Internal Medicine

## 2024-07-08 ENCOUNTER — Ambulatory Visit: Payer: Self-pay

## 2024-07-08 DIAGNOSIS — I495 Sick sinus syndrome: Secondary | ICD-10-CM | POA: Diagnosis not present

## 2024-07-08 LAB — CUP PACEART REMOTE DEVICE CHECK
Battery Remaining Longevity: 115 mo
Battery Voltage: 3 V
Brady Statistic AP VP Percent: 0.04 %
Brady Statistic AP VS Percent: 30.28 %
Brady Statistic AS VP Percent: 0.04 %
Brady Statistic AS VS Percent: 69.64 %
Brady Statistic RA Percent Paced: 30.34 %
Brady Statistic RV Percent Paced: 0.08 %
Date Time Interrogation Session: 20260121204051
Implantable Lead Connection Status: 753985
Implantable Lead Connection Status: 753985
Implantable Lead Implant Date: 20200129
Implantable Lead Implant Date: 20200129
Implantable Lead Location: 753859
Implantable Lead Location: 753860
Implantable Lead Model: 3830
Implantable Lead Model: 5076
Implantable Pulse Generator Implant Date: 20200129
Lead Channel Impedance Value: 304 Ohm
Lead Channel Impedance Value: 323 Ohm
Lead Channel Impedance Value: 361 Ohm
Lead Channel Impedance Value: 475 Ohm
Lead Channel Pacing Threshold Amplitude: 0.625 V
Lead Channel Pacing Threshold Amplitude: 0.625 V
Lead Channel Pacing Threshold Pulse Width: 0.4 ms
Lead Channel Pacing Threshold Pulse Width: 0.4 ms
Lead Channel Sensing Intrinsic Amplitude: 16 mV
Lead Channel Sensing Intrinsic Amplitude: 16 mV
Lead Channel Sensing Intrinsic Amplitude: 2.875 mV
Lead Channel Sensing Intrinsic Amplitude: 2.875 mV
Lead Channel Setting Pacing Amplitude: 1.5 V
Lead Channel Setting Pacing Amplitude: 2 V
Lead Channel Setting Pacing Pulse Width: 0.4 ms
Lead Channel Setting Sensing Sensitivity: 1.2 mV
Zone Setting Status: 755011
Zone Setting Status: 755011

## 2024-07-10 NOTE — Progress Notes (Signed)
 Remote PPM Transmission

## 2024-07-13 ENCOUNTER — Ambulatory Visit: Payer: Self-pay | Admitting: Cardiovascular Disease

## 2024-10-07 ENCOUNTER — Ambulatory Visit: Payer: Self-pay

## 2025-01-06 ENCOUNTER — Ambulatory Visit: Payer: Self-pay

## 2025-04-07 ENCOUNTER — Ambulatory Visit: Payer: Self-pay
